# Patient Record
Sex: Female | Born: 1950 | Race: Asian | Hispanic: No | Marital: Married | Smoking: Never smoker
Health system: Southern US, Community
[De-identification: ages and names within clinical notes are randomized; demographics above are authoritative.]

## PROBLEM LIST (undated history)

## (undated) DIAGNOSIS — E1169 Type 2 diabetes mellitus with other specified complication: Secondary | ICD-10-CM

## (undated) DIAGNOSIS — E669 Obesity, unspecified: Secondary | ICD-10-CM

## (undated) DIAGNOSIS — I10 Essential (primary) hypertension: Secondary | ICD-10-CM

## (undated) DIAGNOSIS — I493 Ventricular premature depolarization: Secondary | ICD-10-CM

## (undated) DIAGNOSIS — I5043 Acute on chronic combined systolic (congestive) and diastolic (congestive) heart failure: Secondary | ICD-10-CM

## (undated) DIAGNOSIS — E7800 Pure hypercholesterolemia, unspecified: Secondary | ICD-10-CM

## (undated) DIAGNOSIS — E119 Type 2 diabetes mellitus without complications: Secondary | ICD-10-CM

## (undated) DIAGNOSIS — I2510 Atherosclerotic heart disease of native coronary artery without angina pectoris: Secondary | ICD-10-CM

---

## 2018-08-20 ENCOUNTER — Inpatient Hospital Stay (HOSPITAL_COMMUNITY)
Admission: EM | Admit: 2018-08-20 | Discharge: 2018-09-22 | DRG: 233 | Disposition: A | Discharge status: Home / Self Care | Payer: Medicaid Other | Attending: Surgery | Admitting: Surgery

## 2018-08-20 ENCOUNTER — Emergency Department (HOSPITAL_COMMUNITY): Payer: Medicaid Other

## 2018-08-20 ENCOUNTER — Encounter (HOSPITAL_COMMUNITY): Payer: Self-pay

## 2018-08-20 ENCOUNTER — Other Ambulatory Visit: Payer: Self-pay

## 2018-08-20 DIAGNOSIS — J209 Acute bronchitis, unspecified: Secondary | ICD-10-CM | POA: Diagnosis present

## 2018-08-20 DIAGNOSIS — R0609 Other forms of dyspnea: Secondary | ICD-10-CM

## 2018-08-20 DIAGNOSIS — Z6832 Body mass index (BMI) 32.0-32.9, adult: Secondary | ICD-10-CM

## 2018-08-20 DIAGNOSIS — R001 Bradycardia, unspecified: Secondary | ICD-10-CM | POA: Diagnosis not present

## 2018-08-20 DIAGNOSIS — Z7902 Long term (current) use of antithrombotics/antiplatelets: Secondary | ICD-10-CM

## 2018-08-20 DIAGNOSIS — B9620 Unspecified Escherichia coli [E. coli] as the cause of diseases classified elsewhere: Secondary | ICD-10-CM | POA: Diagnosis not present

## 2018-08-20 DIAGNOSIS — I493 Ventricular premature depolarization: Secondary | ICD-10-CM

## 2018-08-20 DIAGNOSIS — Y831 Surgical operation with implant of artificial internal device as the cause of abnormal reaction of the patient, or of later complication, without mention of misadventure at the time of the procedure: Secondary | ICD-10-CM | POA: Diagnosis present

## 2018-08-20 DIAGNOSIS — Z794 Long term (current) use of insulin: Secondary | ICD-10-CM

## 2018-08-20 DIAGNOSIS — N3000 Acute cystitis without hematuria: Secondary | ICD-10-CM | POA: Diagnosis not present

## 2018-08-20 DIAGNOSIS — E1122 Type 2 diabetes mellitus with diabetic chronic kidney disease: Secondary | ICD-10-CM | POA: Diagnosis present

## 2018-08-20 DIAGNOSIS — E871 Hypo-osmolality and hyponatremia: Secondary | ICD-10-CM | POA: Diagnosis not present

## 2018-08-20 DIAGNOSIS — K567 Ileus, unspecified: Secondary | ICD-10-CM | POA: Diagnosis not present

## 2018-08-20 DIAGNOSIS — I200 Unstable angina: Secondary | ICD-10-CM

## 2018-08-20 DIAGNOSIS — I4891 Unspecified atrial fibrillation: Secondary | ICD-10-CM | POA: Diagnosis not present

## 2018-08-20 DIAGNOSIS — D696 Thrombocytopenia, unspecified: Secondary | ICD-10-CM | POA: Diagnosis not present

## 2018-08-20 DIAGNOSIS — Z9861 Coronary angioplasty status: Secondary | ICD-10-CM

## 2018-08-20 DIAGNOSIS — N170 Acute kidney failure with tubular necrosis: Secondary | ICD-10-CM | POA: Diagnosis not present

## 2018-08-20 DIAGNOSIS — R079 Chest pain, unspecified: Secondary | ICD-10-CM

## 2018-08-20 DIAGNOSIS — E1169 Type 2 diabetes mellitus with other specified complication: Secondary | ICD-10-CM

## 2018-08-20 DIAGNOSIS — E875 Hyperkalemia: Secondary | ICD-10-CM | POA: Diagnosis present

## 2018-08-20 DIAGNOSIS — D62 Acute posthemorrhagic anemia: Secondary | ICD-10-CM | POA: Diagnosis not present

## 2018-08-20 DIAGNOSIS — E7800 Pure hypercholesterolemia, unspecified: Secondary | ICD-10-CM

## 2018-08-20 DIAGNOSIS — I2510 Atherosclerotic heart disease of native coronary artery without angina pectoris: Secondary | ICD-10-CM | POA: Diagnosis present

## 2018-08-20 DIAGNOSIS — Z23 Encounter for immunization: Secondary | ICD-10-CM

## 2018-08-20 DIAGNOSIS — Z7982 Long term (current) use of aspirin: Secondary | ICD-10-CM

## 2018-08-20 DIAGNOSIS — E1143 Type 2 diabetes mellitus with diabetic autonomic (poly)neuropathy: Secondary | ICD-10-CM | POA: Diagnosis present

## 2018-08-20 DIAGNOSIS — E1151 Type 2 diabetes mellitus with diabetic peripheral angiopathy without gangrene: Secondary | ICD-10-CM | POA: Diagnosis present

## 2018-08-20 DIAGNOSIS — J9811 Atelectasis: Secondary | ICD-10-CM | POA: Diagnosis not present

## 2018-08-20 DIAGNOSIS — Z7951 Long term (current) use of inhaled steroids: Secondary | ICD-10-CM

## 2018-08-20 DIAGNOSIS — N184 Chronic kidney disease, stage 4 (severe): Secondary | ICD-10-CM | POA: Diagnosis present

## 2018-08-20 DIAGNOSIS — K5901 Slow transit constipation: Secondary | ICD-10-CM

## 2018-08-20 DIAGNOSIS — R109 Unspecified abdominal pain: Secondary | ICD-10-CM

## 2018-08-20 DIAGNOSIS — I447 Left bundle-branch block, unspecified: Secondary | ICD-10-CM | POA: Diagnosis present

## 2018-08-20 DIAGNOSIS — K219 Gastro-esophageal reflux disease without esophagitis: Secondary | ICD-10-CM | POA: Diagnosis present

## 2018-08-20 DIAGNOSIS — I5043 Acute on chronic combined systolic (congestive) and diastolic (congestive) heart failure: Secondary | ICD-10-CM

## 2018-08-20 DIAGNOSIS — K3184 Gastroparesis: Secondary | ICD-10-CM | POA: Diagnosis present

## 2018-08-20 DIAGNOSIS — D631 Anemia in chronic kidney disease: Secondary | ICD-10-CM | POA: Diagnosis present

## 2018-08-20 DIAGNOSIS — I25110 Atherosclerotic heart disease of native coronary artery with unstable angina pectoris: Principal | ICD-10-CM | POA: Diagnosis present

## 2018-08-20 DIAGNOSIS — E1165 Type 2 diabetes mellitus with hyperglycemia: Secondary | ICD-10-CM | POA: Diagnosis not present

## 2018-08-20 DIAGNOSIS — N179 Acute kidney failure, unspecified: Secondary | ICD-10-CM

## 2018-08-20 DIAGNOSIS — I25119 Atherosclerotic heart disease of native coronary artery with unspecified angina pectoris: Secondary | ICD-10-CM

## 2018-08-20 DIAGNOSIS — R9439 Abnormal result of other cardiovascular function study: Secondary | ICD-10-CM

## 2018-08-20 DIAGNOSIS — E11649 Type 2 diabetes mellitus with hypoglycemia without coma: Secondary | ICD-10-CM | POA: Diagnosis not present

## 2018-08-20 DIAGNOSIS — I272 Pulmonary hypertension, unspecified: Secondary | ICD-10-CM | POA: Diagnosis present

## 2018-08-20 DIAGNOSIS — I255 Ischemic cardiomyopathy: Secondary | ICD-10-CM | POA: Diagnosis present

## 2018-08-20 DIAGNOSIS — E119 Type 2 diabetes mellitus without complications: Secondary | ICD-10-CM

## 2018-08-20 DIAGNOSIS — Z09 Encounter for follow-up examination after completed treatment for conditions other than malignant neoplasm: Secondary | ICD-10-CM

## 2018-08-20 DIAGNOSIS — Z01810 Encounter for preprocedural cardiovascular examination: Secondary | ICD-10-CM

## 2018-08-20 DIAGNOSIS — E872 Acidosis: Secondary | ICD-10-CM | POA: Diagnosis not present

## 2018-08-20 DIAGNOSIS — T82855A Stenosis of coronary artery stent, initial encounter: Secondary | ICD-10-CM | POA: Diagnosis present

## 2018-08-20 DIAGNOSIS — I420 Dilated cardiomyopathy: Secondary | ICD-10-CM

## 2018-08-20 DIAGNOSIS — Z8249 Family history of ischemic heart disease and other diseases of the circulatory system: Secondary | ICD-10-CM

## 2018-08-20 DIAGNOSIS — I700 Atherosclerosis of aorta: Secondary | ICD-10-CM | POA: Diagnosis present

## 2018-08-20 DIAGNOSIS — I130 Hypertensive heart and chronic kidney disease with heart failure and stage 1 through stage 4 chronic kidney disease, or unspecified chronic kidney disease: Secondary | ICD-10-CM | POA: Diagnosis present

## 2018-08-20 DIAGNOSIS — R0602 Shortness of breath: Secondary | ICD-10-CM

## 2018-08-20 DIAGNOSIS — E669 Obesity, unspecified: Secondary | ICD-10-CM | POA: Diagnosis present

## 2018-08-20 DIAGNOSIS — R05 Cough: Secondary | ICD-10-CM

## 2018-08-20 DIAGNOSIS — R059 Cough, unspecified: Secondary | ICD-10-CM

## 2018-08-20 DIAGNOSIS — E785 Hyperlipidemia, unspecified: Secondary | ICD-10-CM | POA: Diagnosis present

## 2018-08-20 DIAGNOSIS — Z951 Presence of aortocoronary bypass graft: Secondary | ICD-10-CM

## 2018-08-20 HISTORY — DX: Acute on chronic combined systolic (congestive) and diastolic (congestive) heart failure: I50.43

## 2018-08-20 HISTORY — DX: Atherosclerotic heart disease of native coronary artery without angina pectoris: I25.10

## 2018-08-20 HISTORY — DX: Pure hypercholesterolemia, unspecified: E78.00

## 2018-08-20 HISTORY — DX: Type 2 diabetes mellitus with other specified complication: E11.69

## 2018-08-20 HISTORY — DX: Obesity, unspecified: E66.9

## 2018-08-20 HISTORY — DX: Type 2 diabetes mellitus without complications: E11.9

## 2018-08-20 HISTORY — DX: Ventricular premature depolarization: I49.3

## 2018-08-20 HISTORY — DX: Essential (primary) hypertension: I10

## 2018-08-20 LAB — CBC
HCT: 37.2 % (ref 36.0–46.0)
Hemoglobin: 11.6 g/dL — ABNORMAL LOW (ref 12.0–15.0)
MCH: 26.7 pg (ref 26.0–34.0)
MCHC: 31.2 g/dL (ref 30.0–36.0)
MCV: 85.7 fL (ref 80.0–100.0)
NRBC: 0 % (ref 0.0–0.2)
PLATELETS: 195 10*3/uL (ref 150–400)
RBC: 4.34 MIL/uL (ref 3.87–5.11)
RDW: 14.2 % (ref 11.5–15.5)
WBC: 10.6 10*3/uL — ABNORMAL HIGH (ref 4.0–10.5)

## 2018-08-20 LAB — BASIC METABOLIC PANEL
ANION GAP: 8 (ref 5–15)
BUN: 46 mg/dL — ABNORMAL HIGH (ref 8–23)
CALCIUM: 8.9 mg/dL (ref 8.9–10.3)
CO2: 18 mmol/L — ABNORMAL LOW (ref 22–32)
Chloride: 107 mmol/L (ref 98–111)
Creatinine, Ser: 1.71 mg/dL — ABNORMAL HIGH (ref 0.44–1.00)
GFR, EST AFRICAN AMERICAN: 35 mL/min — AB (ref >60)
GFR, EST NON AFRICAN AMERICAN: 30 mL/min — AB (ref >60)
Glucose, Bld: 389 mg/dL — ABNORMAL HIGH (ref 70–99)
Potassium: 5.5 mmol/L — ABNORMAL HIGH (ref 3.5–5.1)
Sodium: 133 mmol/L — ABNORMAL LOW (ref 135–145)

## 2018-08-20 LAB — I-STAT TROPONIN, ED: TROPONIN I, POC: 0.02 ng/mL (ref 0.00–0.08)

## 2018-08-20 NOTE — ED Notes (Signed)
RN Angela Nevin informed of BP

## 2018-08-20 NOTE — ED Triage Notes (Signed)
Pt visiting from [place], has been in [place] one month.  Prior to coming to [place] productive cough- yellow thick phlegm.  Onset 2 weeks mid chest pain, radiating to left arm, and shortness of breath.  No fevers, N/V/D.

## 2018-08-21 ENCOUNTER — Observation Stay (HOSPITAL_COMMUNITY): Payer: Medicaid Other

## 2018-08-21 ENCOUNTER — Encounter (HOSPITAL_COMMUNITY): Payer: Self-pay | Admitting: Cardiology

## 2018-08-21 ENCOUNTER — Observation Stay (HOSPITAL_BASED_OUTPATIENT_CLINIC_OR_DEPARTMENT_OTHER): Payer: Medicaid Other

## 2018-08-21 DIAGNOSIS — R0789 Other chest pain: Secondary | ICD-10-CM | POA: Insufficient documentation

## 2018-08-21 DIAGNOSIS — R05 Cough: Secondary | ICD-10-CM

## 2018-08-21 DIAGNOSIS — R079 Chest pain, unspecified: Secondary | ICD-10-CM

## 2018-08-21 DIAGNOSIS — R0609 Other forms of dyspnea: Secondary | ICD-10-CM

## 2018-08-21 DIAGNOSIS — I5030 Unspecified diastolic (congestive) heart failure: Secondary | ICD-10-CM

## 2018-08-21 DIAGNOSIS — I739 Peripheral vascular disease, unspecified: Secondary | ICD-10-CM

## 2018-08-21 LAB — BASIC METABOLIC PANEL
Anion gap: 9 (ref 5–15)
BUN: 41 mg/dL — ABNORMAL HIGH (ref 8–23)
CHLORIDE: 112 mmol/L — AB (ref 98–111)
CO2: 18 mmol/L — ABNORMAL LOW (ref 22–32)
CREATININE: 1.57 mg/dL — AB (ref 0.44–1.00)
Calcium: 10.4 mg/dL — ABNORMAL HIGH (ref 8.9–10.3)
GFR, EST AFRICAN AMERICAN: 39 mL/min — AB (ref >60)
GFR, EST NON AFRICAN AMERICAN: 34 mL/min — AB (ref >60)
Glucose, Bld: 192 mg/dL — ABNORMAL HIGH (ref 70–99)
Potassium: 5.2 mmol/L — ABNORMAL HIGH (ref 3.5–5.1)
SODIUM: 139 mmol/L (ref 135–145)

## 2018-08-21 LAB — CBC
HEMATOCRIT: 38 % (ref 36.0–46.0)
Hemoglobin: 11.5 g/dL — ABNORMAL LOW (ref 12.0–15.0)
MCH: 26.4 pg (ref 26.0–34.0)
MCHC: 30.3 g/dL (ref 30.0–36.0)
MCV: 87.2 fL (ref 80.0–100.0)
NRBC: 0 % (ref 0.0–0.2)
PLATELETS: 187 10*3/uL (ref 150–400)
RBC: 4.36 MIL/uL (ref 3.87–5.11)
RDW: 14.3 % (ref 11.5–15.5)
WBC: 9.8 10*3/uL (ref 4.0–10.5)

## 2018-08-21 LAB — GLUCOSE, CAPILLARY
GLUCOSE-CAPILLARY: 343 mg/dL — AB (ref 70–99)
Glucose-Capillary: 280 mg/dL — ABNORMAL HIGH (ref 70–99)
Glucose-Capillary: 292 mg/dL — ABNORMAL HIGH (ref 70–99)

## 2018-08-21 LAB — TROPONIN I
Troponin I: <0.03 ng/mL (ref <0.03)
Troponin I: <0.03 ng/mL (ref <0.03)

## 2018-08-21 LAB — CBG MONITORING, ED
GLUCOSE-CAPILLARY: 168 mg/dL — AB (ref 70–99)
Glucose-Capillary: 134 mg/dL — ABNORMAL HIGH (ref 70–99)

## 2018-08-21 LAB — BRAIN NATRIURETIC PEPTIDE: B Natriuretic Peptide: 532.8 pg/mL — ABNORMAL HIGH (ref 0.0–100.0)

## 2018-08-21 LAB — ECHOCARDIOGRAM COMPLETE

## 2018-08-21 LAB — D-DIMER, QUANTITATIVE: D-Dimer, Quant: 1.07 ug/mL-FEU — ABNORMAL HIGH (ref 0.00–0.50)

## 2018-08-21 MED ORDER — SODIUM CHLORIDE 0.9 % IV SOLN: 1.0000 g | Freq: Once | INTRAVENOUS | Status: DC

## 2018-08-21 MED ORDER — ENOXAPARIN SODIUM 30 MG/0.3ML ~~LOC~~ SOLN
30.0000 mg | SUBCUTANEOUS | Status: DC
  Administered 2018-08-21 – 2018-08-22 (×2): 30 mg via SUBCUTANEOUS
  Filled 2018-08-21 (×3): qty 0.3

## 2018-08-21 MED ORDER — AMLODIPINE BESYLATE 5 MG PO TABS
5.0000 mg | ORAL_TABLET | Freq: Every day | ORAL | Status: DC
  Administered 2018-08-21 – 2018-08-23 (×3): 5 mg via ORAL
  Filled 2018-08-21 (×4): qty 1

## 2018-08-21 MED ORDER — HYDRALAZINE HCL 25 MG PO TABS: 25.0000 mg | ORAL_TABLET | Freq: Three times a day (TID) | ORAL | Status: DC | PRN

## 2018-08-21 MED ORDER — IPRATROPIUM-ALBUTEROL 0.5-2.5 (3) MG/3ML IN SOLN
3.0000 mL | Freq: Four times a day (QID) | RESPIRATORY_TRACT | Status: DC
  Administered 2018-08-21 (×2): 3 mL via RESPIRATORY_TRACT
  Filled 2018-08-21 (×2): qty 3

## 2018-08-21 MED ORDER — IPRATROPIUM-ALBUTEROL 0.5-2.5 (3) MG/3ML IN SOLN
3.0000 mL | Freq: Three times a day (TID) | RESPIRATORY_TRACT | Status: DC
  Filled 2018-08-21: qty 3

## 2018-08-21 MED ORDER — BISOPROLOL FUMARATE 5 MG PO TABS
5.0000 mg | ORAL_TABLET | Freq: Every day | ORAL | Status: DC
  Administered 2018-08-21 – 2018-08-23 (×3): 5 mg via ORAL
  Filled 2018-08-21 (×3): qty 1

## 2018-08-21 MED ORDER — GUAIFENESIN ER 600 MG PO TB12
600.0000 mg | ORAL_TABLET | Freq: Two times a day (BID) | ORAL | Status: DC
  Administered 2018-08-21 – 2018-08-28 (×16): 600 mg via ORAL
  Filled 2018-08-21 (×16): qty 1

## 2018-08-21 MED ORDER — PANTOPRAZOLE SODIUM 40 MG PO TBEC
40.0000 mg | DELAYED_RELEASE_TABLET | Freq: Every day | ORAL | Status: DC
  Administered 2018-08-21 – 2018-08-24 (×4): 40 mg via ORAL
  Filled 2018-08-21 (×5): qty 1

## 2018-08-21 MED ORDER — BUDESONIDE 0.25 MG/2ML IN SUSP
0.2500 mg | Freq: Two times a day (BID) | RESPIRATORY_TRACT | Status: DC
  Administered 2018-08-21 – 2018-08-28 (×15): 0.25 mg via RESPIRATORY_TRACT
  Filled 2018-08-21 (×17): qty 2

## 2018-08-21 MED ORDER — ASPIRIN EC 81 MG PO TBEC
81.0000 mg | DELAYED_RELEASE_TABLET | Freq: Every day | ORAL | Status: DC
  Administered 2018-08-21 – 2018-08-28 (×7): 81 mg via ORAL
  Filled 2018-08-21 (×9): qty 1

## 2018-08-21 MED ORDER — NITROGLYCERIN 0.4 MG SL SUBL: 0.4000 mg | SUBLINGUAL_TABLET | SUBLINGUAL | Status: DC | PRN

## 2018-08-21 MED ORDER — CALCIUM GLUCONATE-NACL 1-0.675 GM/50ML-% IV SOLN
1.0000 g | Freq: Once | INTRAVENOUS | Status: AC
  Administered 2018-08-21: 1000 mg via INTRAVENOUS
  Filled 2018-08-21: qty 50

## 2018-08-21 MED ORDER — IOPAMIDOL (ISOVUE-370) INJECTION 76%
INTRAVENOUS | Status: AC
  Administered 2018-08-21: 50 mL
  Filled 2018-08-21: qty 50

## 2018-08-21 MED ORDER — INSULIN NPH (HUMAN) (ISOPHANE) 100 UNIT/ML ~~LOC~~ SUSP
25.0000 [IU] | Freq: Every day | SUBCUTANEOUS | Status: DC
  Administered 2018-08-21: 25 [IU] via SUBCUTANEOUS
  Filled 2018-08-21: qty 10

## 2018-08-21 MED ORDER — AZITHROMYCIN 250 MG PO TABS
250.0000 mg | ORAL_TABLET | Freq: Every day | ORAL | Status: AC
  Administered 2018-08-22 – 2018-08-25 (×4): 250 mg via ORAL
  Filled 2018-08-21 (×6): qty 1

## 2018-08-21 MED ORDER — INSULIN ASPART 100 UNIT/ML ~~LOC~~ SOLN
0.0000 [IU] | Freq: Three times a day (TID) | SUBCUTANEOUS | Status: DC
  Administered 2018-08-21: 1 [IU] via SUBCUTANEOUS
  Administered 2018-08-21 (×2): 5 [IU] via SUBCUTANEOUS
  Administered 2018-08-22: 9 [IU] via SUBCUTANEOUS
  Administered 2018-08-22 – 2018-08-23 (×2): 7 [IU] via SUBCUTANEOUS
  Administered 2018-08-24: 2 [IU] via SUBCUTANEOUS
  Administered 2018-08-24: 9 [IU] via SUBCUTANEOUS
  Administered 2018-08-24: 3 [IU] via SUBCUTANEOUS
  Administered 2018-08-25: 2 [IU] via SUBCUTANEOUS
  Administered 2018-08-25: 5 [IU] via SUBCUTANEOUS
  Administered 2018-08-26: 3 [IU] via SUBCUTANEOUS
  Administered 2018-08-26: 7 [IU] via SUBCUTANEOUS
  Administered 2018-08-26: 3 [IU] via SUBCUTANEOUS
  Filled 2018-08-21 (×5): qty 1

## 2018-08-21 MED ORDER — ATORVASTATIN CALCIUM 20 MG PO TABS
20.0000 mg | ORAL_TABLET | Freq: Every day | ORAL | Status: DC
  Administered 2018-08-21 – 2018-08-23 (×3): 20 mg via ORAL
  Filled 2018-08-21 (×4): qty 1

## 2018-08-21 MED ORDER — GUAIFENESIN-DM 100-10 MG/5ML PO SYRP
5.0000 mL | ORAL_SOLUTION | ORAL | Status: DC | PRN
  Filled 2018-08-21: qty 5

## 2018-08-21 MED ORDER — CLOPIDOGREL BISULFATE 75 MG PO TABS
75.0000 mg | ORAL_TABLET | Freq: Every day | ORAL | Status: DC
  Administered 2018-08-21 – 2018-08-23 (×3): 75 mg via ORAL
  Filled 2018-08-21 (×3): qty 1

## 2018-08-21 MED ORDER — AZITHROMYCIN 250 MG PO TABS
500.0000 mg | ORAL_TABLET | Freq: Every day | ORAL | Status: AC
  Administered 2018-08-21: 500 mg via ORAL
  Filled 2018-08-21: qty 2

## 2018-08-21 MED ORDER — ALUM & MAG HYDROXIDE-SIMETH 200-200-20 MG/5ML PO SUSP
30.0000 mL | ORAL | Status: DC | PRN
  Administered 2018-08-24: 30 mL via ORAL
  Filled 2018-08-21: qty 30

## 2018-08-21 NOTE — ED Notes (Signed)
Spoke with main pharmacy in regards to pt's medications.   They will verify and send.

## 2018-08-21 NOTE — ED Notes (Signed)
ECHO at the bedside.

## 2018-08-21 NOTE — Progress Notes (Addendum)
Patient seen and examined, admitted by Dr. Hampton Abbot this morning  Briefly 67 year old female, visiting her son and daughter-in-law from [place], came a month ago with history of hypertension, multivessel CAD with recent stents to RCA, left circumflex and LAD,  cath in April 2019 presented with increasing dyspnea, productive cough for 1 months, atypical chest pain, at times pleuritic and radiating to the left arm.  Patient reports productive yellowish phlegm and reflux symptoms.  She has been compliant with all her medications that she brought from [place].  Patient reports that on lying down, she feels chest tightness however also states that her cough gets worse on lying.  States legs hurt on walking however no peripheral edema. No fevers or chills, no smoking history, secondhand smoking from her husband however she states that he does not smoke in the house.  She came from [place] a month ago, more than 18-hour flight.  BP (!) 168/77   Pulse 69   Temp 98.7 F (37.1 C) (Oral)   Resp 18   SpO2 99%   On exam, no current chest pain however mild wheezing and scattered coarse breath sounds bilaterally.  No abdominal pain Labs reviewed, creatinine 1.5, was 1.7 yesterday, potassium 5.2. BNP 532.8, troponin x2 neg, WBCs 9.8 Chest x-ray negative for any pneumonia 2D echo results pending EKG showed rate 64, normal sinus rhythm, LBBB, no prior EKG to compare with  A/p Atypical chest pain : In the setting of recent stents, multivessel CAD, acute bronchitis, GERD -Follow serial cardiac enzymes, BNP  elevated at 532.8 -Follow stat d-dimer, if positive, will need CT angiogram chest to rule out PE -Placed on scheduled nebs, Zithromax, Robitussin, Mucinex -Given recent stents and multivessel CAD, cardiology consulted, discussed with Dr. Acie Fredrickson, follow 2D echo -Placed on Protonix and Maalox as needed - CBG's elevated, obtain hemoglobin A1c -Unclear if patient has prior history of CKD, creatinine  improving.  Discussed in detail with the patient and daughter-in-law at the bedside, agree with current management and plan.    Estill Cotta M.D. [facility] 08/21/2018, 10:54 AM  Pager: [phone]  Addendum: D dimer elevated 1.07, will get CTA chest to r/o PE    Ripudeep Rai M.D. [facility] 08/21/2018, 1:06 PM  Pager: [phone]   CTA reviewed : no PE D/w patient's son, relayed above results. EF 45% in April 2019 per reports from [place].   Estill Cotta M.D. [facility] 08/21/2018, 5:47 PM  Pager: [phone]

## 2018-08-21 NOTE — Consult Note (Addendum)
Cardiology Consultation:   Patient IDObie Silos; [id number]; 02-14-1951   Admit date: 08/20/2018 Date of Consult: 08/21/2018  Primary Care Provider: Patient, No Pcp Per Primary Cardiologist: New to [facility]  Patient Profile:   Joy Boyd is a 67 y.o. female with a hx of hypertension, GERD, multivessel CAD with most recent stents to RCA, left circumflex and LAD April 2019 (approximately 6 stents total per son) who is being seen today for the evaluation of persistent cough and chest pain at the request of Dr. Tana Coast.   History of Present Illness:   Joy Boyd is a 67 year old female who is visiting her son and daughter-in-law from [place] for the last month with a history as stated above who presented to [facility] on 08/20/2018 with increasing dyspnea and productive cough with intermittent atypical chest pain with radiation to her left arm for the last 1 month. Chest pain is noted to be worse with lying flat and with coughing, is nonexertional and has pleuritic components. History obtained with the assistance of son at bedside secondary to language barrier. She denies LE swelling, palpitations, recent fever or chills, nausea or vomiting, orthopnea symptoms, presyncopal or syncopal episodes. She denies tobacco, alcohol or illicit drug use. She has had secondhand tobacco exposure from her husband however, she reports he does not smoke in the house. She reports compliance with all of her medications.  Recent prolonged air travel from [place] to [place]. She follows closely with cardiology in [place] and will be here for another month with her son and husband.   In the ED, troponin negative x2.  BNP mildly elevated at 532.  CXR with multifocal coronary artery calcification with aortic atherosclerosis and no edema or consolidation noted.  EKG with NSR, LBBB with no prior tracings for comparison.  Past Medical History:  Diagnosis Date  . Coronary artery disease   . Diabetes mellitus without  complication ([place])   . Hypertension    History reviewed. No pertinent surgical history.   Prior to Admission medications   Medication Sig Start Date End Date Taking? Authorizing Provider  aspirin 325 MG tablet Take 300 mg by mouth daily.    [provider]  atorvastatin (LIPITOR) 20 MG tablet Take 20 mg by mouth daily.    [provider]  bisoprolol (ZEBETA) 5 MG tablet Take 5 mg by mouth daily.    [provider]  clopidogrel (PLAVIX) 75 MG tablet Take 75 mg by mouth daily.    [provider]  insulin NPH-regular Human (NOVOLIN 70/30) (70-30) 100 UNIT/ML injection Inject 30 Units into the skin daily with breakfast.    [provider]    Inpatient Medications: Scheduled Meds: . amLODipine  5 mg Oral Daily  . aspirin EC  81 mg Oral Daily  . atorvastatin  20 mg Oral Daily  . [START ON 08/22/2018] azithromycin  250 mg Oral Daily  . bisoprolol  5 mg Oral Daily  . budesonide  0.25 mg Inhalation BID  . clopidogrel  75 mg Oral Daily  . enoxaparin (LOVENOX) injection  30 mg Subcutaneous Q24H  . guaiFENesin  600 mg Oral BID  . insulin aspart  0-9 Units Subcutaneous TID WC  . insulin NPH Human  25 Units Subcutaneous QAC breakfast  . ipratropium-albuterol  3 mL Nebulization Q6H  . pantoprazole  40 mg Oral Q0600   Continuous Infusions:  PRN Meds: alum & mag hydroxide-simeth, guaiFENesin-dextromethorphan, hydrALAZINE, nitroGLYCERIN  Allergies:   No Known Allergies  Social History:  Social History   Socioeconomic History  . Marital status: Married    Spouse name: Not on file  . Number of children: Not on file  . Years of education: Not on file  . Highest education level: Not on file  Occupational History  . Not on file  Social Needs  . Financial resource strain: Not on file  . Food insecurity:    Worry: Not on file    Inability: Not on file  . Transportation needs:    Medical: Not on file    Non-medical: Not on file  Tobacco Use    . Smoking status: Never Smoker  . Smokeless tobacco: Never Used  Substance and Sexual Activity  . Alcohol use: Never    Frequency: Never  . Drug use: Never  . Sexual activity: Not on file  Lifestyle  . Physical activity:    Days per week: Not on file    Minutes per session: Not on file  . Stress: Not on file  Relationships  . Social connections:    Talks on phone: Not on file    Gets together: Not on file    Attends religious service: Not on file    Active member of club or organization: Not on file    Attends meetings of clubs or organizations: Not on file    Relationship status: Not on file  . Intimate partner violence:    Fear of current or ex partner: Not on file    Emotionally abused: Not on file    Physically abused: Not on file    Forced sexual activity: Not on file  Other Topics Concern  . Not on file  Social History Narrative  . Not on file    Family History:   Family History  Problem Relation Age of Onset  . CAD Father   . Hypertension Father    Family Status:  Family Status  Relation Name Status  . Father  (Not Specified)    ROS:  Please see the history of present illness.  All other ROS reviewed and negative.     Physical Exam/Data:   Vitals:   08/21/18 0930 08/21/18 0945 08/21/18 1022 08/21/18 1208  BP: (!) 177/75 (!) 167/72 (!) 168/77 126/89  Pulse: 71 73 69 61  Resp: 18 18 18 17   Temp:    98.7 F (37.1 C)  TempSrc:    Oral  SpO2: 97% 99% 99% 98%    Intake/Output Summary (Last 24 hours) at 08/21/2018 1242 Last data filed at 08/21/2018 0450 Gross per 24 hour  Intake 50 ml  Output -  Net 50 ml   There were no vitals filed for this visit. There is no height or weight on file to calculate BMI.   General: Well developed, well nourished, NAD Skin: Warm, dry, intact  Head: Normocephalic, atraumatic, clear, moist mucus membranes. Neck: Negative for carotid bruits. No JVD Lungs:Clear to ausculation bilaterally. No wheezes, rales, or  rhonchi. Breathing is unlabored. Cardiovascular: RRR with S1 S2. No murmurs, rubs, gallops, or LV heave appreciated. Abdomen: Soft, non-tender, non-distended with normoactive bowel sounds.  No obvious abdominal masses. MSK: Strength and tone appear normal for age. 5/5 in all extremities Extremities: No edema. No clubbing or cyanosis. DP/PT pulses 2+ bilaterally Neuro: Alert and oriented. No focal deficits. No facial asymmetry. MAE spontaneously. Psych: Responds to questions appropriately with normal affect.    EKG:  The EKG was personally reviewed and demonstrates: 08/21/2018 NSR with LBBB, with no acute ischemic  changes Telemetry:  Telemetry was personally reviewed and demonstrates: 08/21/18 NSR, PVC's LBBB   Relevant CV Studies:  ECHO: Pending  CATH: No records on file  Laboratory Data:  Chemistry Recent Labs  Lab 08/20/18 2200 08/21/18 0412  NA 133* 139  K 5.5* 5.2*  CL 107 112*  CO2 18* 18*  GLUCOSE 389* 192*  BUN 46* 41*  CREATININE 1.71* 1.57*  CALCIUM 8.9 10.4*  GFRNONAA 30* 34*  GFRAA 35* 39*  ANIONGAP 8 9    No results found for: PROT, ALBUMIN, AST, ALT, ALKPHOS, BILITOT Hematology Recent Labs  Lab 08/20/18 2200 08/21/18 0412  WBC 10.6* 9.8  RBC 4.34 4.36  HGB 11.6* 11.5*  HCT 37.2 38.0  MCV 85.7 87.2  MCH 26.7 26.4  MCHC 31.2 30.3  RDW 14.2 14.3  PLT 195 187   Cardiac Enzymes Recent Labs  Lab 08/21/18 0412 08/21/18 1127  TROPONINI <0.03 <0.03    Recent Labs  Lab 08/20/18 2209  TROPIPOC 0.02    BNP Recent Labs  Lab 08/21/18 0358  BNP 532.8*    DDimer  Recent Labs  Lab 08/21/18 1127  DDIMER 1.07*   TSH: No results found for: TSH Lipids:No results found for: CHOL, HDL, LDLCALC, LDLDIRECT, TRIG, CHOLHDL HgbA1c:No results found for: HGBA1C  Radiology/Studies:  Dg Chest 2 View  Result Date: 08/20/2018 CLINICAL DATA:  Cough and chest pain EXAM: CHEST - 2 VIEW COMPARISON:  None. FINDINGS: Lungs are clear. Heart size and pulmonary  vascularity are normal. No adenopathy. There is aortic atherosclerosis. There are multiple foci of coronary artery calcification. No bone lesions. IMPRESSION: Multifocal coronary artery calcification. Aortic atherosclerosis. No edema or consolidation. Heart size normal. Electronically Signed   By: Lowella Grip III M.D.   On: 08/20/2018 23:09   Assessment and Plan:   1.  Atypical chest pain: -Patient presented with atypical chest discomfort over 1 month duration with c/o of persistent dry>>>then productive cough. Pain is nonexertional, not reproducible and has some pleuritis components. She describes the discomfort as a heaviness which is similar to prior CAD events however to a lesser degree. -History of recent PCI/DES with multivessel CAD and 01/2018 -Given pleuritic component and recent prolonged travel, stat d-dimer completed with pending results>> if positive proceed to CTA per IM -Mildly elevated BNP at 532  -CXR with multifocal coronary artery calcification with aortic atherosclerosis and no edema or consolidation noted.  -EKG with NSR, LBBB with no prior tracings for comparison -Troponin, i-STAT troponin negative at 0.02 with subsequent troponin I negative at <0.03.  -Continue to trend cardiac enzymes, if elevated will consider further ischemic evaluation.  Would likely need to undergo stress test versus definitive cardiac catheterization as opposed to coronary CT secondary to multiple prior stenting -Obtain echocardiogram -Denies current chest pain, last episode yesterday evening  2.  Productive cough: -Unclear if cough has pulmonary or cardiac etiology -There is concern for PE given her prolonged air travel -Placed on scheduled nebs, Zithromax, Robitussin and Mucinex per internal medicine  3.  GERD: -Reports symptoms are not similar to her GERD exacerbations -Reports symptoms well controlled with daily Protonix  4. Claudication symptoms: -Will order bilateral LE duplex and  follow symptoms   For questions or updates, please contact [place] Please consult [email or web address] for contact info under Cardiology/STEMI.   Lyndel Safe NP-C [place] Pager: [phone] 08/21/2018 12:42 PM   Attending Note:   The patient was seen and examined.  Agree with assessment and plan as noted above.  Changes made to the above note as needed.  Patient seen and independently examined with  Kathyrn Drown, NP .   We discussed all aspects of the encounter. I agree with the assessment and plan as stated above.  1.    Chest pain :   Difficult to assess.  Its a bit difficult to assess her given the language difference.  Will get an echo and assess LV wall motion  She has stents so a coronary CT angiogram will not be useful\ Would check serial troponins. Would consider Lexiscan myoview tomorrow   2.  ? Claudication:   She describes some leg pain with walkig Will get lower ext. Duplex scan     I have spent a total of 40 minutes with patient reviewing hospital  notes , telemetry, EKGs, labs and examining patient as well as establishing an assessment and plan that was discussed with the patient. > 50% of time was spent in direct patient care.    Thayer Headings, Brooke Bonito., MD, [facility] 08/21/2018, 4:50 PM [address]  [place] Pager [phone]

## 2018-08-21 NOTE — H&P (Signed)
History and Physical  Joy Boyd HER:[id number] DOB: 1951/02/23 DOA: 08/20/2018 2140  Referring physician: Mesner J (ED) PCP: No primary care provider on file. (in [place]) Outpatient Specialists: n/a  HISTORY   Chief Complaint: atypical chest pain  HPI: Joy Boyd is a 67 y.o. female with HTN, hx of multivessel CAD s/p multiple stents visiting from [place] who presents with increasing dyspnea, productive cough x 1 month, and atypical pleuritic chest pain x 1 week. History obtained with daughter serving as Optometrist at bedside. Patient is staying in [place] with her daughter until December. Denies fevers or chills. Reports chest tightness with inspiration and yellowish thick sputum cough. Denies personal smoking history but reports frequent secondhand exposure to cigarette smoke for several years. Does not feel like her typical anginal symptoms. Compliant with all meds including plavix, which she brought from her home in [place]. The daughter provided written records from the [facility] in [place] where she had received her cardiology care which had most of her prior cath records. In summary, she has had stenting in RCA, LCx, and LAD territories with most recent cath in April 2019. Denies lower extremity edema; PND or orthopnea, although feels that her coughing feels worse when lying supine.    Review of Systems:  + cough productive; pleuritic chest pain; +mild dyspnea on exertion - no fevers/chills - no edema, PND, orthopnea - no nausea/vomiting; no tarry, melanotic or bloody stools - no dysuria, increased urinary frequency - no weight changes  Rest of systems reviewed are negative, except as per above history.   ED course:  Vitals Blood pressure (!) 187/111, pulse 60, temperature 98.7 F (37.1 C), temperature source Oral, resp. rate 19, SpO2 98 %. Received calcium gluconate (K 5.5)   Past Medical History:  Diagnosis Date  . Coronary artery disease   . Diabetes  mellitus without complication ([place])   . Hypertension    History reviewed. No pertinent surgical history.  Social History:  reports that she has never smoked. She has never used smokeless tobacco. She reports that she does not drink alcohol or use drugs.  No Known Allergies  History reviewed. No pertinent family history.    Prior to Admission medications   Medication Sig Start Date End Date Taking? Authorizing Provider  aspirin 325 MG tablet Take 300 mg by mouth daily.   Yes [provider]  atorvastatin (LIPITOR) 20 MG tablet Take 20 mg by mouth daily.   Yes [provider]  bisoprolol (ZEBETA) 5 MG tablet Take 5 mg by mouth daily.   Yes [provider]  clopidogrel (PLAVIX) 75 MG tablet Take 75 mg by mouth daily.   Yes [provider]  insulin NPH-regular Human (NOVOLIN 70/30) (70-30) 100 UNIT/ML injection Inject 30 Units into the skin daily with breakfast.   Yes [provider]    PHYSICAL EXAM   Temp:  [98.7 F (37.1 C)] 98.7 F (37.1 C) (10/26 2155) Pulse Rate:  [53-69] 60 (10/27 0345) Cardiac Rhythm: Normal sinus rhythm (10/27 0230) Resp:  [15-23] 19 (10/27 0345) BP: (151-187)/(68-127) 187/111 (10/27 0345) SpO2:  [96 %-100 %] 98 % (10/27 0345)  BP (!) 187/111   Pulse 60   Temp 98.7 F (37.1 C) (Oral)   Resp 19   SpO2 98%    GEN well-nourished elderly [place] female; resting comfortably in bed  HEENT NCAT EOM intact PERRL; clear oropharynx, no cervical LAD; moist mucus membranes  JVP estimated 4-5 cm H2O above RA; no HJR ;  no carotid bruits b/l ;  CV regular normal rate; normal S1 and S2; no m/r/g or S3/S4; PMI non displaced; no parasternal heave  RESP diminished at bases; no crackles or wheezing; symmetric  ABD soft NT ND +normoactive BS  EXT warm throughout b/l; no peripheral edema b/l  PULSES  DP and radials 2+ intact b/l  SKIN/MSK no rashes or lesions  NEURO/PSYCH AAOx4; no focal deficits   DATA   LABS ON  ADMISSION:  Basic Metabolic Panel: Recent Labs  Lab 08/20/18 2200  NA 133*  K 5.5*  CL 107  CO2 18*  GLUCOSE 389*  BUN 46*  CREATININE 1.71*  CALCIUM 8.9   CBC: Recent Labs  Lab 08/20/18 2200  WBC 10.6*  HGB 11.6*  HCT 37.2  MCV 85.7  PLT 195   Liver Function Tests: No results for input(s): AST, ALT, ALKPHOS, BILITOT, PROT, ALBUMIN in the last 168 hours. No results for input(s): LIPASE, AMYLASE in the last 168 hours. No results for input(s): AMMONIA in the last 168 hours. Coagulation:  No results found for: INR, PROTIME No results found for: PTT Lactic Acid, Venous:  No results found for: LATICACIDVEN Cardiac Enzymes: No results for input(s): CKTOTAL, CKMB, CKMBINDEX, TROPONINI in the last 168 hours. Urinalysis: No results found for: COLORURINE, APPEARANCEUR, LABSPEC, PHURINE, GLUCOSEU, HGBUR, BILIRUBINUR, KETONESUR, PROTEINUR, UROBILINOGEN, NITRITE, LEUKOCYTESUR  BNP (last 3 results) No results for input(s): PROBNP in the last 8760 hours.  No results for input(s): BNP in the last 168 hours.  CBG: No results for input(s): GLUCAP in the last 168 hours.  Radiological Exams on Admission: Dg Chest 2 View  Result Date: 08/20/2018 CLINICAL DATA:  Cough and chest pain EXAM: CHEST - 2 VIEW COMPARISON:  None. FINDINGS: Lungs are clear. Heart size and pulmonary vascularity are normal. No adenopathy. There is aortic atherosclerosis. There are multiple foci of coronary artery calcification. No bone lesions. IMPRESSION: Multifocal coronary artery calcification. Aortic atherosclerosis. No edema or consolidation. Heart size normal. Electronically Signed   By: Lowella Grip III M.D.   On: 08/20/2018 23:09    EKG: Independently reviewed. NSR, frequent PVC, LVH with nonspecific ST-T changes in lateral leads; LAD   ASSESSMENT AND PLAN   Assessment: Joy Boyd is a 67 y.o. female with known multivessel CAD s/p multiple stents (last cath in April 2019 at [place]) currently  on plavix, IDDM, who presented with atypical pleuritic chest pain and associated productive cough. Although patient herself is a non-smoker, several years of secondhand smoking exposure. Will treat empirically for COPD flare. Initial troponin and EKG negative -- will trend troponins and obtain TTE for baseline. Exam also notable for elevated BP. She is on a nitrate equivalent and an anti-anginal agent at [place] that is not available here. In the meantime, reasonable to start her on low dose amlodipine and prn SL nitro. No evidence of fluid overload on initial exam (TTE and BNP pending).   Active Problems:   Atypical chest pain   Plan:   # Atypical pleuritic chest pain with + productive cough > initial EKG and troponin - azithromycin (z-pak) and trial budesonide inhaler - trend troponins x 2 this AM and delta EKG - TTE for baseline here - BNP pending - BP control as below - telemetry  # Hx of multivessel CAD s/p multiple stents, last cath in April 2019 (appears that she had POBA through existing stent and one new stent placement)  - continue aspirin  - continue lipitor (on 20mg  dosage at  home)  - continue plavix 75mg  daily  # HTN - note also on nicorandil and trimetazidine at [place]  - starting amlodipine 5mg  daily  - continue bisoprolol 5mg  daily (home med)  - prn hydralazine 25mg  PO if SBP > 175 while inpatient     # ?AKI on CKD (although suspect CKD per family) > Cr 1.7 on admission  - monitor BMP this AM  - hold off on ACE or ARB on this admission  # IDDM on NPH 70/30 30 units at home  - resume NPH at 25 units daily after breakfast   - ACHS fingersticks    # Mild normocytic anemia Hb 11.6 on admission. Unclear baseline, although per family, she has had mild anemia - can follow as outpatient and if Hb stable, would opt to continue her plavix and aspirin given extent of CAD  DVT Prophylaxis: lovenox Code Status:  Full Code Family Communication: daughter at bedside    Disposition Plan: observation with telemetry for chest pain eval  Patient contact: No emergency contact information on file.  Time spent: > 35 minutes  Colbert Ewing, MD [facility] Pager [phone]  If 7PM-7AM, please contact night-coverage [email or web address] Password [email or web address] 08/21/2018, 4:07 AM

## 2018-08-21 NOTE — Progress Notes (Signed)
  Echocardiogram 2D Echocardiogram has been performed.  Suleyma Wafer T [place] 08/21/2018, 9:55 AM

## 2018-08-22 ENCOUNTER — Observation Stay (HOSPITAL_COMMUNITY): Payer: Medicaid Other

## 2018-08-22 ENCOUNTER — Observation Stay (HOSPITAL_BASED_OUTPATIENT_CLINIC_OR_DEPARTMENT_OTHER): Payer: Medicaid Other

## 2018-08-22 DIAGNOSIS — J9811 Atelectasis: Secondary | ICD-10-CM | POA: Diagnosis not present

## 2018-08-22 DIAGNOSIS — N170 Acute kidney failure with tubular necrosis: Secondary | ICD-10-CM | POA: Diagnosis not present

## 2018-08-22 DIAGNOSIS — Z23 Encounter for immunization: Secondary | ICD-10-CM | POA: Diagnosis not present

## 2018-08-22 DIAGNOSIS — Z7902 Long term (current) use of antithrombotics/antiplatelets: Secondary | ICD-10-CM | POA: Diagnosis not present

## 2018-08-22 DIAGNOSIS — Z7982 Long term (current) use of aspirin: Secondary | ICD-10-CM | POA: Diagnosis not present

## 2018-08-22 DIAGNOSIS — N3000 Acute cystitis without hematuria: Secondary | ICD-10-CM | POA: Diagnosis not present

## 2018-08-22 DIAGNOSIS — Z794 Long term (current) use of insulin: Secondary | ICD-10-CM | POA: Diagnosis not present

## 2018-08-22 DIAGNOSIS — R079 Chest pain, unspecified: Secondary | ICD-10-CM | POA: Diagnosis present

## 2018-08-22 DIAGNOSIS — E669 Obesity, unspecified: Secondary | ICD-10-CM | POA: Diagnosis present

## 2018-08-22 DIAGNOSIS — I5043 Acute on chronic combined systolic (congestive) and diastolic (congestive) heart failure: Secondary | ICD-10-CM | POA: Diagnosis present

## 2018-08-22 DIAGNOSIS — I739 Peripheral vascular disease, unspecified: Secondary | ICD-10-CM

## 2018-08-22 DIAGNOSIS — K567 Ileus, unspecified: Secondary | ICD-10-CM | POA: Diagnosis not present

## 2018-08-22 DIAGNOSIS — N184 Chronic kidney disease, stage 4 (severe): Secondary | ICD-10-CM | POA: Diagnosis present

## 2018-08-22 DIAGNOSIS — E871 Hypo-osmolality and hyponatremia: Secondary | ICD-10-CM | POA: Diagnosis not present

## 2018-08-22 DIAGNOSIS — D62 Acute posthemorrhagic anemia: Secondary | ICD-10-CM | POA: Diagnosis not present

## 2018-08-22 DIAGNOSIS — E875 Hyperkalemia: Secondary | ICD-10-CM | POA: Diagnosis present

## 2018-08-22 DIAGNOSIS — E1143 Type 2 diabetes mellitus with diabetic autonomic (poly)neuropathy: Secondary | ICD-10-CM | POA: Diagnosis present

## 2018-08-22 DIAGNOSIS — E872 Acidosis: Secondary | ICD-10-CM | POA: Diagnosis not present

## 2018-08-22 DIAGNOSIS — Y831 Surgical operation with implant of artificial internal device as the cause of abnormal reaction of the patient, or of later complication, without mention of misadventure at the time of the procedure: Secondary | ICD-10-CM | POA: Diagnosis present

## 2018-08-22 DIAGNOSIS — I2510 Atherosclerotic heart disease of native coronary artery without angina pectoris: Secondary | ICD-10-CM

## 2018-08-22 DIAGNOSIS — Z8249 Family history of ischemic heart disease and other diseases of the circulatory system: Secondary | ICD-10-CM | POA: Diagnosis not present

## 2018-08-22 DIAGNOSIS — R9439 Abnormal result of other cardiovascular function study: Secondary | ICD-10-CM

## 2018-08-22 DIAGNOSIS — I200 Unstable angina: Secondary | ICD-10-CM

## 2018-08-22 DIAGNOSIS — I447 Left bundle-branch block, unspecified: Secondary | ICD-10-CM | POA: Diagnosis present

## 2018-08-22 DIAGNOSIS — I130 Hypertensive heart and chronic kidney disease with heart failure and stage 1 through stage 4 chronic kidney disease, or unspecified chronic kidney disease: Secondary | ICD-10-CM | POA: Diagnosis present

## 2018-08-22 DIAGNOSIS — E1122 Type 2 diabetes mellitus with diabetic chronic kidney disease: Secondary | ICD-10-CM | POA: Diagnosis present

## 2018-08-22 DIAGNOSIS — Z6832 Body mass index (BMI) 32.0-32.9, adult: Secondary | ICD-10-CM | POA: Diagnosis not present

## 2018-08-22 DIAGNOSIS — E1151 Type 2 diabetes mellitus with diabetic peripheral angiopathy without gangrene: Secondary | ICD-10-CM | POA: Diagnosis present

## 2018-08-22 DIAGNOSIS — I25119 Atherosclerotic heart disease of native coronary artery with unspecified angina pectoris: Secondary | ICD-10-CM

## 2018-08-22 DIAGNOSIS — I25110 Atherosclerotic heart disease of native coronary artery with unstable angina pectoris: Secondary | ICD-10-CM | POA: Diagnosis present

## 2018-08-22 DIAGNOSIS — T82855A Stenosis of coronary artery stent, initial encounter: Secondary | ICD-10-CM | POA: Diagnosis present

## 2018-08-22 LAB — BASIC METABOLIC PANEL
Anion gap: 7 (ref 5–15)
BUN: 40 mg/dL — AB (ref 8–23)
CALCIUM: 8.7 mg/dL — AB (ref 8.9–10.3)
CO2: 19 mmol/L — ABNORMAL LOW (ref 22–32)
Chloride: 108 mmol/L (ref 98–111)
Creatinine, Ser: 1.76 mg/dL — ABNORMAL HIGH (ref 0.44–1.00)
GFR calc Af Amer: 34 mL/min — ABNORMAL LOW (ref >60)
GFR, EST NON AFRICAN AMERICAN: 29 mL/min — AB (ref >60)
Glucose, Bld: 430 mg/dL — ABNORMAL HIGH (ref 70–99)
Potassium: 5.3 mmol/L — ABNORMAL HIGH (ref 3.5–5.1)
SODIUM: 134 mmol/L — AB (ref 135–145)

## 2018-08-22 LAB — CBC
HCT: 35.1 % — ABNORMAL LOW (ref 36.0–46.0)
Hemoglobin: 10.8 g/dL — ABNORMAL LOW (ref 12.0–15.0)
MCH: 26.3 pg (ref 26.0–34.0)
MCHC: 30.8 g/dL (ref 30.0–36.0)
MCV: 85.6 fL (ref 80.0–100.0)
PLATELETS: 135 10*3/uL — AB (ref 150–400)
RBC: 4.1 MIL/uL (ref 3.87–5.11)
RDW: 14.3 % (ref 11.5–15.5)
WBC: 9.1 10*3/uL (ref 4.0–10.5)
nRBC: 0 % (ref 0.0–0.2)

## 2018-08-22 LAB — GLUCOSE, CAPILLARY
GLUCOSE-CAPILLARY: 319 mg/dL — AB (ref 70–99)
GLUCOSE-CAPILLARY: 235 mg/dL — AB (ref 70–99)
GLUCOSE-CAPILLARY: 153 mg/dL — AB (ref 70–99)
Glucose-Capillary: 279 mg/dL — ABNORMAL HIGH (ref 70–99)
Glucose-Capillary: 473 mg/dL — ABNORMAL HIGH (ref 70–99)
Glucose-Capillary: 428 mg/dL — ABNORMAL HIGH (ref 70–99)

## 2018-08-22 LAB — NM MYOCAR MULTI W/SPECT W/WALL MOTION / EF
CHL CUP MPHR: 155 {beats}/min
CSEPED: 0 min
CSEPPHR: 109 {beats}/min
Estimated workload: 1 METS
Exercise duration (sec): 0 s
Percent HR: 70 %
RPE: 0
Rest HR: 83 {beats}/min

## 2018-08-22 LAB — MAGNESIUM: MAGNESIUM: 1.6 mg/dL — AB (ref 1.7–2.4)

## 2018-08-22 LAB — HEMOGLOBIN A1C
HEMOGLOBIN A1C: 9.3 % — AB (ref 4.8–5.6)
Mean Plasma Glucose: 220.21 mg/dL

## 2018-08-22 LAB — TROPONIN I

## 2018-08-22 MED ORDER — SODIUM CHLORIDE 0.9% FLUSH: 3.0000 mL | INTRAVENOUS | Status: DC | PRN

## 2018-08-22 MED ORDER — REGADENOSON 0.4 MG/5ML IV SOLN
INTRAVENOUS | Status: AC
  Filled 2018-08-22: qty 5

## 2018-08-22 MED ORDER — AMINOPHYLLINE 25 MG/ML IV SOLN
INTRAVENOUS | Status: AC
  Filled 2018-08-22: qty 10

## 2018-08-22 MED ORDER — INSULIN ASPART 100 UNIT/ML ~~LOC~~ SOLN
5.0000 [IU] | Freq: Once | SUBCUTANEOUS | Status: AC
  Administered 2018-08-22: 5 [IU] via SUBCUTANEOUS
  Filled 2018-08-22: qty 1

## 2018-08-22 MED ORDER — SODIUM CHLORIDE 0.9 % IV SOLN: 250.0000 mL | INTRAVENOUS | Status: DC | PRN

## 2018-08-22 MED ORDER — INSULIN ASPART 100 UNIT/ML ~~LOC~~ SOLN
11.0000 [IU] | Freq: Once | SUBCUTANEOUS | Status: AC
  Administered 2018-08-22: 11 [IU] via SUBCUTANEOUS

## 2018-08-22 MED ORDER — SODIUM CHLORIDE 0.9 % IV SOLN
INTRAVENOUS | Status: DC
  Administered 2018-08-23: 08:00:00 via INTRAVENOUS

## 2018-08-22 MED ORDER — TECHNETIUM TC 99M TETROFOSMIN IV KIT
30.0000 | PACK | Freq: Once | INTRAVENOUS | Status: AC | PRN
  Administered 2018-08-22: 30 via INTRAVENOUS

## 2018-08-22 MED ORDER — IPRATROPIUM-ALBUTEROL 0.5-2.5 (3) MG/3ML IN SOLN: 3.0000 mL | RESPIRATORY_TRACT | Status: DC | PRN

## 2018-08-22 MED ORDER — AMINOPHYLLINE 25 MG/ML IV (NUC MED)
75.0000 mg | Freq: Once | INTRAVENOUS | Status: AC
  Administered 2018-08-22: 75 mg via INTRAVENOUS

## 2018-08-22 MED ORDER — ASPIRIN 81 MG PO CHEW
81.0000 mg | CHEWABLE_TABLET | ORAL | Status: AC
  Administered 2018-08-23: 81 mg via ORAL

## 2018-08-22 MED ORDER — SODIUM CHLORIDE 0.9% FLUSH
3.0000 mL | Freq: Two times a day (BID) | INTRAVENOUS | Status: DC
  Administered 2018-08-22 – 2018-08-23 (×2): 3 mL via INTRAVENOUS

## 2018-08-22 MED ORDER — TECHNETIUM TC 99M TETROFOSMIN IV KIT
10.0000 | PACK | Freq: Once | INTRAVENOUS | Status: AC | PRN
  Administered 2018-08-22: 10 via INTRAVENOUS

## 2018-08-22 MED ORDER — REGADENOSON 0.4 MG/5ML IV SOLN
0.4000 mg | Freq: Once | INTRAVENOUS | Status: AC
  Administered 2018-08-22: 0.4 mg via INTRAVENOUS
  Filled 2018-08-22: qty 5

## 2018-08-22 NOTE — Progress Notes (Signed)
ABI's have been completed. Right 1.05 Left 1.14  08/22/18 11:26 AM Joy Boyd RVT

## 2018-08-22 NOTE — Progress Notes (Signed)
Progress Note  Patient Name: Joy Boyd Date of Encounter: 08/22/2018  Primary Cardiologist: No primary care provider on file. new  Subjective    No chest pain during the night.   Inpatient Medications    Scheduled Meds: . aminophylline      . amLODipine  5 mg Oral Daily  . aspirin EC  81 mg Oral Daily  . atorvastatin  20 mg Oral Daily  . azithromycin  250 mg Oral Daily  . bisoprolol  5 mg Oral Daily  . budesonide  0.25 mg Inhalation BID  . clopidogrel  75 mg Oral Daily  . enoxaparin (LOVENOX) injection  30 mg Subcutaneous Q24H  . guaiFENesin  600 mg Oral BID  . insulin aspart  0-9 Units Subcutaneous TID WC  . insulin NPH Human  25 Units Subcutaneous QAC breakfast  . ipratropium-albuterol  3 mL Nebulization TID  . pantoprazole  40 mg Oral Q0600  . regadenoson       Continuous Infusions:  PRN Meds: alum & mag hydroxide-simeth, guaiFENesin-dextromethorphan, hydrALAZINE, nitroGLYCERIN   Vital Signs    Vitals:   08/22/18 1006 08/22/18 1009 08/22/18 1011 08/22/18 1015  BP: (!) 189/100 (!) 158/96 (!) 166/91 (!) 174/94  Pulse: (!) 103 94 94 80  Resp:      Temp:      TempSrc:      SpO2:        Intake/Output Summary (Last 24 hours) at 08/22/2018 1025 Last data filed at 08/21/2018 1500 Gross per 24 hour  Intake 120 ml  Output -  Net 120 ml   There were no vitals filed for this visit.  Telemetry    SR with bigeminy PVCs, couplets and 4-5 beats NSVT   - Personally Reviewed  ECG    No new this AM - Personally Reviewed  Physical Exam   GEN: No acute distress.   Neck: No JVD Cardiac: RRR with premature beats, no murmurs, rubs, or gallops.  Respiratory: Clear to auscultation bilaterally. GI: Soft, nontender, non-distended  MS: No edema; No deformity. Neuro:  Nonfocal  Psych: Normal affect   Labs    Chemistry Recent Labs  Lab 08/20/18 2200 08/21/18 0412 08/22/18 0132  NA 133* 139 134*  K 5.5* 5.2* 5.3*  CL 107 112* 108  CO2 18* 18* 19*    GLUCOSE 389* 192* 430*  BUN 46* 41* 40*  CREATININE 1.71* 1.57* 1.76*  CALCIUM 8.9 10.4* 8.7*  GFRNONAA 30* 34* 29*  GFRAA 35* 39* 34*  ANIONGAP 8 9 7      Hematology Recent Labs  Lab 08/20/18 2200 08/21/18 0412 08/22/18 0132  WBC 10.6* 9.8 9.1  RBC 4.34 4.36 4.10  HGB 11.6* 11.5* 10.8*  HCT 37.2 38.0 35.1*  MCV 85.7 87.2 85.6  MCH 26.7 26.4 26.3  MCHC 31.2 30.3 30.8  RDW 14.2 14.3 14.3  PLT 195 187 135*    Cardiac Enzymes Recent Labs  Lab 08/21/18 0412 08/21/18 1127 08/21/18 1822 08/22/18 0132  TROPONINI <0.03 <0.03 <0.03 <0.03    Recent Labs  Lab 08/20/18 2209  TROPIPOC 0.02     BNP Recent Labs  Lab 08/21/18 0358  BNP 532.8*     DDimer  Recent Labs  Lab 08/21/18 1127  DDIMER 1.07*     Radiology    Dg Chest 2 View  Result Date: 08/20/2018 CLINICAL DATA:  Cough and chest pain EXAM: CHEST - 2 VIEW COMPARISON:  None. FINDINGS: Lungs are clear. Heart size and pulmonary vascularity are normal.  No adenopathy. There is aortic atherosclerosis. There are multiple foci of coronary artery calcification. No bone lesions. IMPRESSION: Multifocal coronary artery calcification. Aortic atherosclerosis. No edema or consolidation. Heart size normal. Electronically Signed   By: Lowella Grip III M.D.   On: 08/20/2018 23:09   Ct Angio Chest Pe W Or Wo Contrast  Result Date: 08/21/2018 CLINICAL DATA:  Increased dyspnea and chest pain over the past month. Pt also having productive cough. Elevated d-dimer. Symptoms began after a long plane ride from [place] to here to visit her son. EXAM: CT ANGIOGRAPHY CHEST WITH CONTRAST TECHNIQUE: Multidetector CT imaging of the chest was performed using the standard protocol during bolus administration of intravenous contrast. Multiplanar CT image reconstructions and MIPs were obtained to evaluate the vascular anatomy. CONTRAST:  73mL ISOVUE-370 IOPAMIDOL (ISOVUE-370) INJECTION 76% COMPARISON:  Chest radiographs, 08/20/2018.  FINDINGS: Cardiovascular: There is satisfactory opacification of the pulmonary arteries to the segmental level. There is no evidence of a pulmonary embolism. Heart is mildly enlarged. Three-vessel coronary artery calcifications. No pericardial effusion. Mild aortic atherosclerotic calcifications. Mediastinum/Nodes: No neck base, axillary, mediastinal or hilar masses or enlarged lymph nodes. Trachea and esophagus are unremarkable. Lungs/Pleura: No evidence of pneumonia or pulmonary edema. Mild bronchial wall thickening in the lower lobes. Mild dependent subsegmental atelectasis most evident at the left posterolateral lung base. No lung mass or suspicious nodule. No pleural effusion or pneumothorax. Upper Abdomen: No acute abnormality. Musculoskeletal: No chest wall abnormality. No acute or significant osseous findings. Review of the MIP images confirms the above findings. IMPRESSION: 1. No evidence of a pulmonary embolism. 2. Mild cardiomegaly. Coronary artery calcifications and aortic atherosclerosis. 3. No evidence of pneumonia or pulmonary edema. Mild bronchial wall thickening in the lower lobes. This is consistent with acute bronchial infection/inflammation given the history of productive cough. Aortic Atherosclerosis (ICD10-I70.0). Electronically Signed   By: Lajean Manes M.D.   On: 08/21/2018 17:02    Cardiac Studies   nuc study pending  Patient Profile     67 y.o. 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  Neg MI.    Assessment & Plan    Chest pain, I did have interpreter with exam.  No pain at night but with exertion same pain as prior to stent in April.  Neg MI.  With nuc study she had same chest discomfort.  Study -stress portion completed, nuc results pending.   Productive cough, ? Related to PVCs though no cough with stress  test.  Claudication dopplers pending.   GERD pains not like her GERD.     For questions or updates, please contact [facility] Please consult [email or web address] for contact info under        Signed, Cecilie Kicks, NP  08/22/2018, 10:25 AM

## 2018-08-22 NOTE — Progress Notes (Signed)
Notified Shahmehdi MD about CBG of 453, MD advises to give 20 units of Novolog with pt meal. Read back of orders confirmed.

## 2018-08-22 NOTE — Progress Notes (Addendum)
Admitting paged about inpatient admit orders and CBG 428.

## 2018-08-22 NOTE — Progress Notes (Signed)
PROGRESS NOTE    Patient: Joy Boyd                            PCP: Patient, No Pcp Per                    DOB: 1951/01/06            DOA: 08/20/2018 NGE:[id number]             DOS: 08/22/2018, 12:50 PM   LOS: 0 days   Date of Service: The patient was seen and examined on 08/22/2018  Subjective:   Patient was seen and examined this morning, stable no acute distress, she has been n.p.o., she has completed the first part of her Lexiscan nuclear stress test reporting some chest discomfort at the time of the stress test.  Otherwise no symptoms overnight. The patient family was present at bedside including her daughter, son, husband. Current finding including echo findings were discussed with the patient family in detail The current expressed understanding and agreement with the current plan.  Brief Narrative:   Joy Boyd is a 67 y.o. female with HTN, hx of multivessel CAD s/p multiple stents visiting from [place] who presents with increasing dyspnea, productive cough x 1 month, and atypical pleuritic chest pain x 1 week. History obtained with daughter serving as Optometrist at bedside. Patient is staying in [place] with her daughter until December. Denies fevers or chills. Reports chest tightness with inspiration and yellowish thick sputum cough. Denies personal smoking history but reports frequent secondhand exposure to cigarette smoke for several years. Does not feel like her typical anginal symptoms. Compliant with all meds including plavix, which she brought from her home in [place]. The daughter provided written records from the [facility] in [place] where she had received her cardiology care which had most of her prior cath records. In summary, she has had stenting in RCA, LCx, and LAD territories with most recent cath in April 2019. Denies lower extremity edema; PND or orthopnea, although feels that her coughing feels worse when lying supine.   Assessment & Plan:    Atypical  chest pain :  -Patient reporting of improved chest pain, did have some chest discomfort with the stress test this morning the first part of Lexiscan nuclear stress test. -D-dimer was elevated, was followed with CTA of the chest was negative for PE  In the setting of recent stents, multivessel CAD, acute bronchitis, GERD -Follow serial cardiac enzymes, BNP  elevated at 532.8 -Placed on scheduled nebs, Zithromax, Robitussin, Mucinex -Given recent stents and multivessel CAD, cardiology consulted, discussed with Dr. Acie Fredrickson, To the recording was completed, reviewed, reporting global hypokinesis, ejection fraction reduced 15 to 20% -Appreciate cardiology input for recommendation -Pending final report Lexiscan stress chest -Continue supportive therapy, aspirin, Plavix, statins PRN nitroglycerin  HTN  - note also on nicorandil and trimetazidine at [place] - starting amlodipine 5mg  daily - continue bisoprolol 5mg  daily (home med) -prn hydralazine 25mg  PO if SBP > 175 while inpatient  Hyperlipidemia -Continue statin  Acute on chronic kidney disease -Monitoring BUN/creatinine, currently at baseline, -Currently holding off ACE/ARB  IDDM on NPH 70/30 30 units at home             - resume NPH at 25 units daily after breakfast              - ACHS fingersticks   DVT prophylaxis: SCD/Compression stockings  Code  Status:   Code Status: Full Code  Family Communication:  The above findings and plan of care has been discussed with patient and family in detail, they expressed understanding and agreement of above.  Disposition Plan:   Anticipated 1-2 days  Consultants: Cardiologist  Procedures:    Cardiac Lexiscan nuclear stress test  Antimicrobials:  Anti-infectives (From admission, onward)   Start     Dose/Rate Route Frequency Ordered Stop   08/22/18 1000  azithromycin (ZITHROMAX) tablet 250 mg     250 mg Oral Daily 08/21/18 0405 08/26/18 0959   08/21/18 0500  azithromycin (ZITHROMAX)  tablet 500 mg     500 mg Oral Daily 08/21/18 0405 08/21/18 0606       Medication:  . aminophylline      . amLODipine  5 mg Oral Daily  . aspirin EC  81 mg Oral Daily  . atorvastatin  20 mg Oral Daily  . azithromycin  250 mg Oral Daily  . bisoprolol  5 mg Oral Daily  . budesonide  0.25 mg Inhalation BID  . clopidogrel  75 mg Oral Daily  . enoxaparin (LOVENOX) injection  30 mg Subcutaneous Q24H  . guaiFENesin  600 mg Oral BID  . insulin aspart  0-9 Units Subcutaneous TID WC  . insulin NPH Human  25 Units Subcutaneous QAC breakfast  . ipratropium-albuterol  3 mL Nebulization TID  . pantoprazole  40 mg Oral Q0600  . regadenoson        alum & mag hydroxide-simeth, guaiFENesin-dextromethorphan, hydrALAZINE, nitroGLYCERIN     Objective:   Vitals:   08/22/18 1009 08/22/18 1011 08/22/18 1015 08/22/18 1151  BP: (!) 158/96 (!) 166/91 (!) 174/94 (!) 150/98  Pulse: 94 94 80 73  Resp:    16  Temp:    97.9 F (36.6 C)  TempSrc:    Oral  SpO2:    99%    Intake/Output Summary (Last 24 hours) at 08/22/2018 1250 Last data filed at 08/21/2018 1500 Gross per 24 hour  Intake 120 ml  Output -  Net 120 ml   There were no vitals filed for this visit.   Examination:    General exam: Appears calm and comfortable  BP (!) 150/98 (BP Location: Left Arm)   Pulse 73   Temp 97.9 F (36.6 C) (Oral)   Resp 16   SpO2 99%    Physical Exam  Constitution:  Alert, cooperative, no distress,  Psychiatric: Normal and stable mood and affect, cognition intact,   HEENT: Normocephalic, PERRL, otherwise with in Normal limits  Chest:Chest symmetric Cardio vascular:  S1/S2, RRR, No murmure, No Rubs or Gallops  pulmonary: Clear to auscultation bilaterally, respirations unlabored, negative wheezes / crackles Abdomen: Soft, non-tender, non-distended, bowel sounds,no masses, no organomegaly Muscular skeletal: Limited exam - in bed, able to move all 4 extremities, Normal strength,  Neuro: CNII-XII  intact. , normal motor and sensation, reflexes intact  Extremities: No pitting edema lower extremities, +2 pulses  Skin: Dry, warm to touch, negative for any Rashes, No open wounds Wounds: per nursing documentation  LABs:  CBC Latest Ref Rng & Units 08/22/2018 08/21/2018 08/20/2018  WBC 4.0 - 10.5 K/uL 9.1 9.8 10.6(H)  Hemoglobin 12.0 - 15.0 g/dL 10.8(L) 11.5(L) 11.6(L)  Hematocrit 36.0 - 46.0 % 35.1(L) 38.0 37.2  Platelets 150 - 400 K/uL 135(L) 187 195   CMP Latest Ref Rng & Units 08/22/2018 08/21/2018 08/20/2018  Glucose 70 - 99 mg/dL 430(H) 192(H) 389(H)  BUN 8 - 23 mg/dL 40(H)  41(H) 46(H)  Creatinine 0.44 - 1.00 mg/dL 1.76(H) 1.57(H) 1.71(H)  Sodium 135 - 145 mmol/L 134(L) 139 133(L)  Potassium 3.5 - 5.1 mmol/L 5.3(H) 5.2(H) 5.5(H)  Chloride 98 - 111 mmol/L 108 112(H) 107  CO2 22 - 32 mmol/L 19(L) 18(L) 18(L)  Calcium 8.9 - 10.3 mg/dL 8.7(L) 10.4(H) 8.9

## 2018-08-22 NOTE — Progress Notes (Signed)
Inpatient Diabetes Program Recommendations  AACE/ADA: New Consensus Statement on Inpatient Glycemic Control (2015)  Target Ranges:  Prepandial:   less than 140 mg/dL      Peak postprandial:   less than 180 mg/dL (1-2 hours)      Critically ill patients:  140 - 180 mg/dL   Results for Joy Boyd, Joy Boyd (MRN [id number]) as of 08/22/2018 12:04  Ref. Range 08/21/2018 12:01 08/21/2018 18:34 08/21/2018 23:43 08/22/2018 06:34 08/22/2018 11:48  Glucose-Capillary Latest Ref Range: 70 - 99 mg/dL 280 (H) 292 (H) 343 (H) 279 (H) 319 (H)   Review of Glycemic Control  Diabetes history: DM 2 Outpatient Diabetes medications: 70/30 40 units qam, 30 units qpm Current orders for Inpatient glycemic control: NPH 25 units Daily, Novolog 0-9 units tid  A1c 9.3% on 10/28  Inpatient Diabetes Program Recommendations:    Glucose trends in the 300's. Patient placed on NPH and not 70/30 insulin while here and NPO for stress test this am.   If patient can resume diet consider reordering 70/30 insulin at lower dose, 35 units QAM and 20 units QPM to start, starting tonight with supper time, Consider increasing Novolog Correction scale to 0-15 units.  Thanks,  Tama Headings RN, MSN, BC-ADM Inpatient Diabetes Coordinator Team Pager [phone] (8a-5p)

## 2018-08-22 NOTE — Progress Notes (Signed)
CBG rechecked 235 at this time.

## 2018-08-23 ENCOUNTER — Encounter (HOSPITAL_COMMUNITY): Payer: Self-pay | Admitting: Cardiovascular Disease

## 2018-08-23 ENCOUNTER — Inpatient Hospital Stay (HOSPITAL_COMMUNITY)
Admission: EM | Disposition: A | Discharge status: Home / Self Care | Payer: Self-pay | Source: Home / Self Care | Attending: Surgery

## 2018-08-23 DIAGNOSIS — I493 Ventricular premature depolarization: Secondary | ICD-10-CM

## 2018-08-23 DIAGNOSIS — E669 Obesity, unspecified: Secondary | ICD-10-CM

## 2018-08-23 DIAGNOSIS — I5043 Acute on chronic combined systolic (congestive) and diastolic (congestive) heart failure: Secondary | ICD-10-CM

## 2018-08-23 DIAGNOSIS — E1169 Type 2 diabetes mellitus with other specified complication: Secondary | ICD-10-CM

## 2018-08-23 DIAGNOSIS — I420 Dilated cardiomyopathy: Secondary | ICD-10-CM

## 2018-08-23 DIAGNOSIS — N179 Acute kidney failure, unspecified: Secondary | ICD-10-CM

## 2018-08-23 DIAGNOSIS — E785 Hyperlipidemia, unspecified: Secondary | ICD-10-CM

## 2018-08-23 DIAGNOSIS — I5023 Acute on chronic systolic (congestive) heart failure: Secondary | ICD-10-CM

## 2018-08-23 DIAGNOSIS — N189 Chronic kidney disease, unspecified: Secondary | ICD-10-CM

## 2018-08-23 DIAGNOSIS — Z9861 Coronary angioplasty status: Secondary | ICD-10-CM

## 2018-08-23 DIAGNOSIS — E119 Type 2 diabetes mellitus without complications: Secondary | ICD-10-CM

## 2018-08-23 DIAGNOSIS — I2510 Atherosclerotic heart disease of native coronary artery without angina pectoris: Secondary | ICD-10-CM

## 2018-08-23 DIAGNOSIS — E7800 Pure hypercholesterolemia, unspecified: Secondary | ICD-10-CM

## 2018-08-23 HISTORY — DX: Pure hypercholesterolemia, unspecified: E78.00

## 2018-08-23 HISTORY — PX: LEFT HEART CATH AND CORONARY ANGIOGRAPHY: CATH118249

## 2018-08-23 HISTORY — DX: Type 2 diabetes mellitus with other specified complication: E66.9

## 2018-08-23 HISTORY — DX: Type 2 diabetes mellitus with other specified complication: E11.69

## 2018-08-23 HISTORY — DX: Ventricular premature depolarization: I49.3

## 2018-08-23 HISTORY — DX: Acute on chronic combined systolic (congestive) and diastolic (congestive) heart failure: I50.43

## 2018-08-23 LAB — BASIC METABOLIC PANEL
ANION GAP: 9 (ref 5–15)
Anion gap: 6 (ref 5–15)
Anion gap: 7 (ref 5–15)
BUN: 44 mg/dL — ABNORMAL HIGH (ref 8–23)
BUN: 44 mg/dL — AB (ref 8–23)
BUN: 41 mg/dL — ABNORMAL HIGH (ref 8–23)
CALCIUM: 9 mg/dL (ref 8.9–10.3)
CHLORIDE: 111 mmol/L (ref 98–111)
CHLORIDE: 113 mmol/L — AB (ref 98–111)
CO2: 17 mmol/L — ABNORMAL LOW (ref 22–32)
CO2: 18 mmol/L — AB (ref 22–32)
CO2: 17 mmol/L — AB (ref 22–32)
CREATININE: 1.79 mg/dL — AB (ref 0.44–1.00)
Calcium: 8.9 mg/dL (ref 8.9–10.3)
Calcium: 8.9 mg/dL (ref 8.9–10.3)
Chloride: 110 mmol/L (ref 98–111)
Creatinine, Ser: 1.85 mg/dL — ABNORMAL HIGH (ref 0.44–1.00)
Creatinine, Ser: 1.63 mg/dL — ABNORMAL HIGH (ref 0.44–1.00)
GFR calc Af Amer: 32 mL/min — ABNORMAL LOW (ref >60)
GFR calc Af Amer: 33 mL/min — ABNORMAL LOW (ref >60)
GFR calc non Af Amer: 29 mL/min — ABNORMAL LOW (ref >60)
GFR calc non Af Amer: 32 mL/min — ABNORMAL LOW (ref >60)
GFR, EST AFRICAN AMERICAN: 37 mL/min — AB (ref >60)
GFR, EST NON AFRICAN AMERICAN: 27 mL/min — AB (ref >60)
GLUCOSE: 260 mg/dL — AB (ref 70–99)
Glucose, Bld: 387 mg/dL — ABNORMAL HIGH (ref 70–99)
Glucose, Bld: 240 mg/dL — ABNORMAL HIGH (ref 70–99)
POTASSIUM: 5 mmol/L (ref 3.5–5.1)
POTASSIUM: 5.8 mmol/L — AB (ref 3.5–5.1)
Potassium: 5.2 mmol/L — ABNORMAL HIGH (ref 3.5–5.1)
SODIUM: 135 mmol/L (ref 135–145)
SODIUM: 137 mmol/L (ref 135–145)
Sodium: 136 mmol/L (ref 135–145)

## 2018-08-23 LAB — CBC
HCT: 39.3 % (ref 36.0–46.0)
Hemoglobin: 11.6 g/dL — ABNORMAL LOW (ref 12.0–15.0)
MCH: 25.7 pg — AB (ref 26.0–34.0)
MCHC: 29.5 g/dL — AB (ref 30.0–36.0)
MCV: 87.1 fL (ref 80.0–100.0)
Platelets: 165 10*3/uL (ref 150–400)
RBC: 4.51 MIL/uL (ref 3.87–5.11)
RDW: 14.3 % (ref 11.5–15.5)
WBC: 11.1 10*3/uL — ABNORMAL HIGH (ref 4.0–10.5)
nRBC: 0 % (ref 0.0–0.2)

## 2018-08-23 LAB — GLUCOSE, CAPILLARY
GLUCOSE-CAPILLARY: 415 mg/dL — AB (ref 70–99)
GLUCOSE-CAPILLARY: 101 mg/dL — AB (ref 70–99)
GLUCOSE-CAPILLARY: 48 mg/dL — AB (ref 70–99)
Glucose-Capillary: 324 mg/dL — ABNORMAL HIGH (ref 70–99)
Glucose-Capillary: 98 mg/dL (ref 70–99)

## 2018-08-23 LAB — MAGNESIUM: MAGNESIUM: 1.6 mg/dL — AB (ref 1.7–2.4)

## 2018-08-23 SURGERY — LEFT HEART CATH AND CORONARY ANGIOGRAPHY
Anesthesia: LOCAL

## 2018-08-23 MED ORDER — SODIUM CHLORIDE 0.9 % IV SOLN
INTRAVENOUS | Status: AC
  Administered 2018-08-23: 19:00:00 via INTRAVENOUS

## 2018-08-23 MED ORDER — MIDAZOLAM HCL 2 MG/2ML IJ SOLN
INTRAMUSCULAR | Status: AC
  Filled 2018-08-23: qty 2

## 2018-08-23 MED ORDER — MAGNESIUM SULFATE 2 GM/50ML IV SOLN
2.0000 g | Freq: Once | INTRAVENOUS | Status: AC
  Administered 2018-08-23: 2 g via INTRAVENOUS
  Filled 2018-08-23: qty 50

## 2018-08-23 MED ORDER — SODIUM CHLORIDE 0.9 % IV SOLN: 250.0000 mL | INTRAVENOUS | Status: DC | PRN

## 2018-08-23 MED ORDER — BISOPROLOL FUMARATE 5 MG PO TABS
10.0000 mg | ORAL_TABLET | Freq: Every day | ORAL | Status: DC
  Administered 2018-08-24: 10 mg via ORAL
  Filled 2018-08-23 (×2): qty 2

## 2018-08-23 MED ORDER — MIDAZOLAM HCL 2 MG/2ML IJ SOLN
INTRAMUSCULAR | Status: DC | PRN
  Administered 2018-08-23: 2 mg via INTRAVENOUS
  Administered 2018-08-23: 1 mg via INTRAVENOUS

## 2018-08-23 MED ORDER — SODIUM CHLORIDE 0.9% FLUSH: 3.0000 mL | INTRAVENOUS | Status: DC | PRN

## 2018-08-23 MED ORDER — FENTANYL CITRATE (PF) 100 MCG/2ML IJ SOLN
INTRAMUSCULAR | Status: AC
  Filled 2018-08-23: qty 2

## 2018-08-23 MED ORDER — IOHEXOL 350 MG/ML SOLN
INTRAVENOUS | Status: DC | PRN
  Administered 2018-08-23: 80 mL via INTRAVENOUS

## 2018-08-23 MED ORDER — HEPARIN (PORCINE) IN NACL 1000-0.9 UT/500ML-% IV SOLN
INTRAVENOUS | Status: AC
  Filled 2018-08-23: qty 500

## 2018-08-23 MED ORDER — INSULIN ASPART PROT & ASPART (70-30 MIX) 100 UNIT/ML ~~LOC~~ SUSP
15.0000 [IU] | Freq: Every day | SUBCUTANEOUS | Status: DC
  Filled 2018-08-23: qty 10

## 2018-08-23 MED ORDER — LIDOCAINE HCL (PF) 1 % IJ SOLN
INTRAMUSCULAR | Status: DC | PRN
  Administered 2018-08-23: 15 mL
  Administered 2018-08-23: 2 mL

## 2018-08-23 MED ORDER — HEPARIN (PORCINE) IN NACL 100-0.45 UNIT/ML-% IJ SOLN
950.0000 [IU]/h | INTRAMUSCULAR | Status: DC
  Administered 2018-08-24: 850 [IU]/h via INTRAVENOUS
  Administered 2018-08-25: 1000 [IU]/h via INTRAVENOUS
  Filled 2018-08-23 (×2): qty 250

## 2018-08-23 MED ORDER — INSULIN ASPART 100 UNIT/ML ~~LOC~~ SOLN
12.0000 [IU] | Freq: Once | SUBCUTANEOUS | Status: AC
  Administered 2018-08-23: 12 [IU] via SUBCUTANEOUS

## 2018-08-23 MED ORDER — GLUCOSE 4 G PO CHEW
1.0000 | CHEWABLE_TABLET | Freq: Once | ORAL | Status: AC
  Administered 2018-08-23: 4 g via ORAL

## 2018-08-23 MED ORDER — SODIUM CHLORIDE 0.9 % IV BOLUS
500.0000 mL | Freq: Once | INTRAVENOUS | Status: AC
  Administered 2018-08-23: 500 mL via INTRAVENOUS

## 2018-08-23 MED ORDER — VERAPAMIL HCL 2.5 MG/ML IV SOLN
INTRAVENOUS | Status: AC
  Filled 2018-08-23: qty 2

## 2018-08-23 MED ORDER — LIDOCAINE HCL (PF) 1 % IJ SOLN
INTRAMUSCULAR | Status: AC
  Filled 2018-08-23: qty 30

## 2018-08-23 MED ORDER — FENTANYL CITRATE (PF) 100 MCG/2ML IJ SOLN
INTRAMUSCULAR | Status: DC | PRN
  Administered 2018-08-23 (×2): 25 ug via INTRAVENOUS

## 2018-08-23 MED ORDER — INSULIN NPH (HUMAN) (ISOPHANE) 100 UNIT/ML ~~LOC~~ SUSP
25.0000 [IU] | Freq: Two times a day (BID) | SUBCUTANEOUS | Status: DC
  Administered 2018-08-23: 25 [IU] via SUBCUTANEOUS

## 2018-08-23 MED ORDER — ONDANSETRON HCL 4 MG/2ML IJ SOLN: 4.0000 mg | Freq: Four times a day (QID) | INTRAMUSCULAR | Status: DC | PRN

## 2018-08-23 MED ORDER — GLUCOSE 4 G PO CHEW
CHEWABLE_TABLET | ORAL | Status: AC
  Administered 2018-08-23: 4 g via ORAL
  Filled 2018-08-23: qty 2

## 2018-08-23 MED ORDER — SODIUM CHLORIDE 0.9% FLUSH
3.0000 mL | Freq: Two times a day (BID) | INTRAVENOUS | Status: DC
  Administered 2018-08-25 – 2018-08-27 (×2): 3 mL via INTRAVENOUS

## 2018-08-23 MED ORDER — AMLODIPINE BESYLATE 2.5 MG PO TABS
2.5000 mg | ORAL_TABLET | Freq: Every day | ORAL | Status: DC
  Administered 2018-08-24: 2.5 mg via ORAL
  Filled 2018-08-23 (×2): qty 1

## 2018-08-23 MED ORDER — INSULIN ASPART 100 UNIT/ML ~~LOC~~ SOLN
10.0000 [IU] | Freq: Once | SUBCUTANEOUS | Status: AC
  Administered 2018-08-23: 10 [IU] via SUBCUTANEOUS

## 2018-08-23 MED ORDER — INSULIN ASPART PROT & ASPART (70-30 MIX) 100 UNIT/ML ~~LOC~~ SUSP
25.0000 [IU] | Freq: Every day | SUBCUTANEOUS | Status: DC
  Filled 2018-08-23 (×2): qty 10

## 2018-08-23 MED ORDER — INSULIN ASPART 100 UNIT/ML ~~LOC~~ SOLN: 15.0000 [IU] | Freq: Once | SUBCUTANEOUS | Status: DC

## 2018-08-23 SURGICAL SUPPLY — 14 items
CATH INFINITI 5FR MULTPACK ANG (CATHETERS) ×2 IMPLANT
CLOSURE MYNX CONTROL 5F (Vascular Products) ×2 IMPLANT
GLIDESHEATH SLEND A-KIT 6F 22G (SHEATH) IMPLANT
GLIDESHEATH SLEND SS 6F .021 (SHEATH) ×2 IMPLANT
GUIDEWIRE INQWIRE 1.5J.035X260 (WIRE) IMPLANT
INQWIRE 1.5J .035X260CM (WIRE)
KIT HEART LEFT (KITS) ×2 IMPLANT
PACK CARDIAC CATHETERIZATION (CUSTOM PROCEDURE TRAY) ×2 IMPLANT
SHEATH PINNACLE 5F 10CM (SHEATH) ×2 IMPLANT
SHEATH PROBE COVER 6X72 (BAG) ×2 IMPLANT
TRANSDUCER W/STOPCOCK (MISCELLANEOUS) ×2 IMPLANT
TUBING CIL FLEX 10 FLL-RA (TUBING) ×2 IMPLANT
WIRE EMERALD 3MM-J .035X150CM (WIRE) ×2 IMPLANT
WIRE HI TORQ VERSACORE-J 145CM (WIRE) ×2 IMPLANT

## 2018-08-23 NOTE — H&P (View-Only) (Signed)
Progress Note  Patient Name: Joy Boyd Date of Encounter: 08/23/2018  Primary Cardiologist: No primary care provider on file. new- will be going back to [place] within 1 month  Subjective   Feeling well.  Had mild CP this am.  Denies palpitations, lightheadedness or shortness of breath.   Inpatient Medications    Scheduled Meds: . amLODipine  5 mg Oral Daily  . aspirin EC  81 mg Oral Daily  . atorvastatin  20 mg Oral Daily  . azithromycin  250 mg Oral Daily  . bisoprolol  5 mg Oral Daily  . budesonide  0.25 mg Inhalation BID  . clopidogrel  75 mg Oral Daily  . enoxaparin (LOVENOX) injection  30 mg Subcutaneous Q24H  . guaiFENesin  600 mg Oral BID  . insulin aspart  0-9 Units Subcutaneous TID WC  . insulin NPH Human  25 Units Subcutaneous BID AC & HS  . pantoprazole  40 mg Oral Q0600  . sodium chloride flush  3 mL Intravenous Q12H   Continuous Infusions: . sodium chloride    . sodium chloride 10 mL/hr at 08/23/18 0753   PRN Meds: sodium chloride, alum & mag hydroxide-simeth, guaiFENesin-dextromethorphan, hydrALAZINE, ipratropium-albuterol, nitroGLYCERIN, sodium chloride flush   Vital Signs    Vitals:   08/23/18 0010 08/23/18 0359 08/23/18 0621 08/23/18 0837  BP: (!) 134/51 104/72    Pulse: 63 75    Resp: 16 17    Temp:  98.1 F (36.7 C)    TempSrc:  Oral    SpO2: 95% 97%  99%  Weight:   70.2 kg   Height:        Intake/Output Summary (Last 24 hours) at 08/23/2018 0911 Last data filed at 08/22/2018 2329 Gross per 24 hour  Intake 240 ml  Output -  Net 240 ml   Filed Weights   08/22/18 1600 08/22/18 2133 08/23/18 0621  Weight: 72.6 kg 70.2 kg 70.2 kg    Telemetry    Sinus rhythm.  Frequent PVCs. - Personally Reviewed  ECG   n/a  - Personally Reviewed  Physical Exam   VS:  BP 104/72   Pulse 75   Temp 98.1 F (36.7 C) (Oral)   Resp 17   Ht 5\' 1"  (1.549 m)   Wt 70.2 kg   SpO2 99%   BMI 29.25 kg/m  , BMI Body mass index is 29.25  kg/m. GENERAL:  Well appearing HEENT: Pupils equal round and reactive, fundi not visualized, oral mucosa unremarkable NECK:  No jugular venous distention, waveform within normal limits, carotid upstroke brisk and symmetric, no bruits, no thyromegaly LUNGS:  Clear to auscultation bilaterally HEART:  Mostly regular with frequent ectopy.  PMI not displaced or sustained,S1 and S2 within normal limits, no S3, no S4, no clicks, no rubs, no murmurs ABD:  Flat, positive bowel sounds normal in frequency in pitch, no bruits, no rebound, no guarding, no midline pulsatile mass, no hepatomegaly, no splenomegaly EXT:  2 plus pulses throughout, no edema, no cyanosis no clubbing SKIN:  No rashes no nodules NEURO:  Cranial nerves II through XII grossly intact, motor grossly intact throughout [facility]:  Cognitively intact, oriented to person place and time   Labs    Chemistry Recent Labs  Lab 08/21/18 0412 08/22/18 0132 08/23/18 0419  NA 139 134* 136  K 5.2* 5.3* 5.2*  CL 112* 108 110  CO2 18* 19* 17*  GLUCOSE 192* 430* 260*  BUN 41* 40* 44*  CREATININE 1.57* 1.76* 1.85*  CALCIUM 10.4* 8.7* 9.0  GFRNONAA 34* 29* 27*  GFRAA 39* 34* 32*  ANIONGAP 9 7 9      Hematology Recent Labs  Lab 08/21/18 0412 08/22/18 0132 08/23/18 0419  WBC 9.8 9.1 11.1*  RBC 4.36 4.10 4.51  HGB 11.5* 10.8* 11.6*  HCT 38.0 35.1* 39.3  MCV 87.2 85.6 87.1  MCH 26.4 26.3 25.7*  MCHC 30.3 30.8 29.5*  RDW 14.3 14.3 14.3  PLT 187 135* 165    Cardiac Enzymes Recent Labs  Lab 08/21/18 0412 08/21/18 1127 08/21/18 1822 08/22/18 0132  TROPONINI <0.03 <0.03 <0.03 <0.03    Recent Labs  Lab 08/20/18 2209  TROPIPOC 0.02     BNP Recent Labs  Lab 08/21/18 0358  BNP 532.8*     DDimer  Recent Labs  Lab 08/21/18 1127  DDIMER 1.07*     Radiology    Ct Angio Chest Pe W Or Wo Contrast  Result Date: 08/21/2018 CLINICAL DATA:  Increased dyspnea and chest pain over the past month. Pt also having productive  cough. Elevated d-dimer. Symptoms began after a long plane ride from [place] to here to visit her son. EXAM: CT ANGIOGRAPHY CHEST WITH CONTRAST TECHNIQUE: Multidetector CT imaging of the chest was performed using the standard protocol during bolus administration of intravenous contrast. Multiplanar CT image reconstructions and MIPs were obtained to evaluate the vascular anatomy. CONTRAST:  9mL ISOVUE-370 IOPAMIDOL (ISOVUE-370) INJECTION 76% COMPARISON:  Chest radiographs, 08/20/2018. FINDINGS: Cardiovascular: There is satisfactory opacification of the pulmonary arteries to the segmental level. There is no evidence of a pulmonary embolism. Heart is mildly enlarged. Three-vessel coronary artery calcifications. No pericardial effusion. Mild aortic atherosclerotic calcifications. Mediastinum/Nodes: No neck base, axillary, mediastinal or hilar masses or enlarged lymph nodes. Trachea and esophagus are unremarkable. Lungs/Pleura: No evidence of pneumonia or pulmonary edema. Mild bronchial wall thickening in the lower lobes. Mild dependent subsegmental atelectasis most evident at the left posterolateral lung base. No lung mass or suspicious nodule. No pleural effusion or pneumothorax. Upper Abdomen: No acute abnormality. Musculoskeletal: No chest wall abnormality. No acute or significant osseous findings. Review of the MIP images confirms the above findings. IMPRESSION: 1. No evidence of a pulmonary embolism. 2. Mild cardiomegaly. Coronary artery calcifications and aortic atherosclerosis. 3. No evidence of pneumonia or pulmonary edema. Mild bronchial wall thickening in the lower lobes. This is consistent with acute bronchial infection/inflammation given the history of productive cough. Aortic Atherosclerosis (ICD10-I70.0). Electronically Signed   By: Lajean Manes M.D.   On: 08/21/2018 17:02   Nm Myocar Multi W/spect W/wall Motion / Ef  Result Date: 08/22/2018 CLINICAL DATA:  Chest pain and shortness of breath.  History of CAD post previous coronary stent placement. History of diabetes and hypertension. EXAM: MYOCARDIAL IMAGING WITH SPECT (REST AND PHARMACOLOGIC-STRESS) GATED LEFT VENTRICULAR WALL MOTION STUDY LEFT VENTRICULAR EJECTION FRACTION TECHNIQUE: Standard myocardial SPECT imaging was performed after resting intravenous injection of 10 mCi Tc-37m tetrofosmin. Subsequently, intravenous infusion of Lexiscan was performed under the supervision of the Cardiology staff. At peak effect of the drug, 30 mCi Tc-25m tetrofosmin was injected intravenously and standard myocardial SPECT imaging was performed. Quantitative gated imaging was also performed to evaluate left ventricular wall motion, and estimate left ventricular ejection fraction. COMPARISON:  Chest CT-08/21/2018 FINDINGS: Raw images: Mild GI attenuation is seen worse on the provided stress images. No significant breast or chest wall attenuation. No significant patient motion artifact. Perfusion: There is a moderate sized area of decreased perfusion involving the lateral wall of the  left ventricle as well as a small area of decreased profusion involving the apical aspect of the septum on the provided stress images worrisome for areas of pharmacologically induced ischemia. No definitive matched areas of decreased perfusion to suggest prior infarction. Wall Motion: The left ventricle is moderately dilated. There is severe global hypokinesia with relative akinesia involving the septum. Left Ventricular Ejection Fraction: 24 % End diastolic volume 588 ml End systolic volume 96 ml IMPRESSION: 1. Suspected areas of pharmacologically induced ischemia involving the lateral wall of the left ventricle and septum. 2. No definitive scintigraphic evidence of prior infarction. 3. Moderate left ventricular dilatation. 4. Severe left ventricular global hypokinesia with relative akinesia of the septum. Ejection fraction - 24% Electronically Signed   By: Sandi Mariscal M.D.   On:  08/22/2018 14:27    Cardiac Studies   Echo 08/21/18: Study Conclusions  - Left ventricle: The cavity size was at the upper limits of   normal. Wall thickness was increased in a pattern of mild LVH.   Systolic function was severely reduced. The estimated ejection   fraction was in the range of 15% to 20%. Diffuse hypokinesis.   Features are consistent with a pseudonormal left ventricular   filling pattern, with concomitant abnormal relaxation and   increased filling pressure (grade 2 diastolic dysfunction). - Mitral valve: There was mild regurgitation. Valve area by   pressure half-time: 1.95 cm^2. - Left atrium: The atrium was moderately dilated. - Right ventricle: Systolic function was mildly reduced. - Right atrium: The atrium was mildly to moderately dilated. - Pulmonary arteries: PA peak pressure: 34 mm Hg (S). - Impressions: There is sevre global LV dysfunction in the setting   of very frequent PVCs. 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  Impressions:  - There is sevre global LV dysfunction in the setting of very   frequent PVCs. 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  Patient Profile     Ms. Manson is a 61F from [place] with CAD s/p multiple PCIs, hypertension, diabetes, and CKD 3 here with unstable angina  Assessment & Plan    # Acute on chronic systolic and diastolic heart failure: # Hypertension: LVEF this admission is 15 to 20%, down from 45% when last checked 05/2018.  Her blood pressure has been very labile.  She is had very frequent PVCs, which may be contributing to her cardiomyopathy.  She already received her medications this morning.  For tomorrow we will plan to increase bisoprolol to 10 mg and reduce amlodipine to 2.5 mg.  She is not on an ACE inhibitor/ARB/Entresto due to acute on chronic renal failure.  # CAD s/p multiple PCIs: # Unstable angina: # Hyperlipidemia: Troponin remains negative but she continues to complain of chest pain, most recently this  morning.  She has frequent PVCs as above and reduced systolic function.  Plan for left heart catheterization today if renal function is improved.  Continue aspirin, clopidogrel, and atorvastatin.  # Acute on chronic renal failure:  At baseline her creatinine ranges from 1.5-1.7.  It was 1.85 today.  We will give 500 mL normal saline bolus.  Repeat BMP at 1 PM.  # DM: Glucose very poorly controlled.  She should be on an SGLT2 inhibitor given her heart failure and DM.      For questions or updates, please contact [place] Please consult [email or web address] for contact info under        Signed, Skeet Latch, MD  08/23/2018, 9:11 AM

## 2018-08-23 NOTE — Progress Notes (Addendum)
Inpatient Diabetes Program Recommendations  AACE/ADA: New Consensus Statement on Inpatient Glycemic Control (2019)  Target Ranges:  Prepandial:   less than 140 mg/dL      Peak postprandial:   less than 180 mg/dL (1-2 hours)      Critically ill patients:  140 - 180 mg/dL   Results for Joy Boyd, Joy Boyd (MRN [id number]) as of 08/23/2018 10:53  Ref. Range 08/22/2018 06:34 08/22/2018 11:48 08/22/2018 16:46 08/22/2018 18:17 08/22/2018 20:41 08/22/2018 22:19 08/23/2018 07:17  Glucose-Capillary Latest Ref Range: 70 - 99 mg/dL 279 (H) 319 (H)  Novolog 7 units 473 (H)  Novolog 20 units 428 (H) 235 (H) 153 (H) 324 (H)  Novolog 17 units  NPH 25 units   Review of Glycemic Control  Diabetes history: DM2 Outpatient Diabetes medications: 70/30 35 units QAM, 70/30 25 units QPM Current orders for Inpatient glycemic control: NPH 25 units BID, Novolog 0-9 units TID with meals  Inpatient Diabetes Program Recommendations:  Insulin - Basal: In reviewing chart, noted patient did NOT receive any NPH on 08/22/18. As a result glucose up to 473 mg/dl on 08/22/18 and fasting glucose 324 mg/dl today. Patient has received NPH 25 units this morning. If patient is eating, recommend discontinuing NPH and ordering 70/30 25 units QAM and 70/30 15 units QPM which will provide basal and meal coverage insulin.  Thanks, Barnie Alderman, RN, MSN, CDE Diabetes Coordinator Inpatient Diabetes Program [phone] (Team Pager from 8am to 5pm)

## 2018-08-23 NOTE — Interval H&P Note (Signed)
History and Physical Interval Note:  08/23/2018 4:55 PM  Joy Boyd  has presented today for surgery, with the diagnosis of angina - positive stress.   The various methods of treatment have been discussed with the patient and family. After consideration of risks, benefits and other options for treatment, the patient has consented to  Procedure(s): LEFT HEART CATH AND CORONARY ANGIOGRAPHY (N/A) with possible PERCUTANEOUS CORONARY INTERVENTION as a surgical intervention .  The patient's history has been reviewed, patient examined, no change in status, stable for surgery.  I have reviewed the patient's chart and labs.  Questions were answered to the patient's satisfaction.     Cath Lab Visit (complete for each Cath Lab visit)  Clinical Evaluation Leading to the Procedure:   ACS: No.  Non-ACS:    Anginal Classification: CCS II  Anti-ischemic medical therapy: Maximal Therapy (2 or more classes of medications)  Non-Invasive Test Results: High-risk stress test findings: cardiac mortality >3%/year  Prior CABG: No previous CABG    Glenetta Hew

## 2018-08-23 NOTE — Plan of Care (Signed)
  Problem: Activity: Goal: Risk for activity intolerance will decrease Outcome: Progressing   Problem: Safety: Goal: Ability to remain free from injury will improve Outcome: Progressing   

## 2018-08-23 NOTE — Progress Notes (Signed)
ANTICOAGULATION CONSULT NOTE - Initial Consult  Pharmacy Consult for Heparin Indication: chest pain/ACS  No Known Allergies  Patient Measurements: Height: 5\' 1"  (154.9 cm) Weight: 154 lb 12.8 oz (70.2 kg) IBW/kg (Calculated) : 47.8   Vital Signs: Temp: 97.7 F (36.5 C) (10/29 2000) Temp Source: Oral (10/29 2000) BP: 132/70 (10/29 2045) Pulse Rate: 59 (10/29 2100)  Labs: Recent Labs    08/21/18 0412 08/21/18 1127 08/21/18 1822 08/22/18 0132 08/23/18 0419 08/23/18 1132 08/23/18 1438  HGB 11.5*  --   --  10.8* 11.6*  --   --   HCT 38.0  --   --  35.1* 39.3  --   --   PLT 187  --   --  135* 165  --   --   CREATININE 1.57*  --   --  1.76* 1.85* 1.79* 1.63*  TROPONINI <0.03 <0.03 <0.03 <0.03  --   --   --     Estimated Creatinine Clearance: 30.9 mL/min (A) (by C-G formula based on SCr of 1.63 mg/dL (H)).   Medical History: Past Medical History:  Diagnosis Date  . Acute on chronic combined systolic and diastolic CHF (congestive heart failure) ([place]) 08/23/2018  . Coronary artery disease   . Diabetes mellitus type 2 in obese ([place]) 08/23/2018  . Diabetes mellitus without complication ([place])   . Hypertension   . Pure hypercholesterolemia 08/23/2018  . PVC (premature ventricular contraction) 08/23/2018    Assessment: 67yof admitted with CP, SOB and has Hx CAD - cath with instent stenosis - CABG v PCI  - await decisions - start heparin 8hr after sheath pulled - out at 1800 - start at 0200 10/30.    Goal of Therapy:  Heparin level 0.3-0.7 units/ml Monitor platelets by anticoagulation protocol: Yes   Plan:  Heparin 850 uts/hr - start 0200 10/30 - 8hr after sheath out.   HL 6hr after start Daily HL, CBC  Bonnita Nasuti Pharm.D. CPP, BCPS Clinical Pharmacist [phone] 08/23/2018 9:33 PM

## 2018-08-23 NOTE — ED Provider Notes (Signed)
[facility] Provider Note   CSN: [id number] Arrival date & time: 08/20/18  2137     History   Chief Complaint Chief Complaint  Patient presents with  . Chest Pain  . Cough    HPI Rola Brinson is a 67 y.o. female.  H/o CAD s/p stents most recently in April here with worsening DOE and CP on exertion with generalized fatigue. Apparently also has stage III CKD.    Chest Pain   This is a recurrent problem. The current episode started 6 to 12 hours ago. The problem occurs daily. The problem has been gradually worsening. The pain is associated with exertion. The pain is present in the lateral region and substernal region. The pain is moderate. The quality of the pain is described as brief and exertional. The pain does not radiate. The symptoms are aggravated by exertion. Associated symptoms include cough. She has tried nothing for the symptoms. Risk factors include being elderly and obesity.  Her past medical history is significant for CAD.  Cough  Associated symptoms include chest pain.    Past Medical History:  Diagnosis Date  . Coronary artery disease   . Diabetes mellitus without complication ([place])   . Hypertension     Patient Active Problem List   Diagnosis Date Noted  . CAD in native artery   . Unstable angina ([place])   . Abnormal nuclear stress test   . Atypical chest pain 08/21/2018    History reviewed. No pertinent surgical history.   OB History   None      Home Medications    Prior to Admission medications   Medication Sig Start Date End Date Taking? Authorizing Provider  aspirin 325 MG tablet Take 150 mg by mouth 3 (three) times a week.    Yes [provider]  atorvastatin (LIPITOR) 20 MG tablet Take 20 mg by mouth daily at 6 PM.    Yes [provider]  bisoprolol (ZEBETA) 5 MG tablet Take 5 mg by mouth daily.   Yes [provider]  clopidogrel (PLAVIX) 75 MG tablet Take 75 mg by mouth daily.   Yes  [provider]  insulin NPH-regular Human (NOVOLIN 70/30) (70-30) 100 UNIT/ML injection Inject 25-35 Units into the skin See admin instructions. Inject 35 units am, 25 units evening.   Yes [provider]  nitroGLYCERIN (NITROSTAT) 0.4 MG SL tablet Place 2.6 mg under the tongue at bedtime. **Nitromint**   Yes [provider]  valsartan-hydrochlorothiazide (DIOVAN-HCT) 160-12.5 MG tablet Take 1 tablet by mouth daily.   Yes [provider]    Family History Family History  Problem Relation Age of Onset  . CAD Father   . Hypertension Father     Social History Social History   Tobacco Use  . Smoking status: Never Smoker  . Smokeless tobacco: Never Used  Substance Use Topics  . Alcohol use: Never    Frequency: Never  . Drug use: Never     Allergies   Patient has no known allergies.   Review of Systems Review of Systems  Respiratory: Positive for cough.   Cardiovascular: Positive for chest pain.  All other systems reviewed and are negative.    Physical Exam Updated Vital Signs BP 104/72   Pulse 75   Temp 98.1 F (36.7 C) (Oral)   Resp 17   Ht 5\' 1"  (1.549 m)   Wt 70.2 kg   SpO2 99%   BMI 29.25 kg/m  Physical Exam  Constitutional: She appears well-developed and well-nourished.  HENT:  Head: Normocephalic and atraumatic.  Eyes: Conjunctivae and EOM are normal.  Neck: Normal range of motion.  Cardiovascular: Normal rate and regular rhythm.  Pulmonary/Chest: No stridor. No respiratory distress. She has decreased breath sounds. She has wheezes.  Abdominal: Soft. She exhibits no distension.  Musculoskeletal: Normal range of motion. She exhibits edema.  Neurological: She is alert.  Skin: Skin is warm and dry.  Nursing note and vitals reviewed.    ED Treatments / Results  Labs (all labs ordered are listed, but only abnormal results are displayed) Labs Reviewed  BASIC METABOLIC PANEL - Abnormal; Notable for the following  components:      Result Value   Sodium 133 (*)    Potassium 5.5 (*)    CO2 18 (*)    Glucose, Bld 389 (*)    BUN 46 (*)    Creatinine, Ser 1.71 (*)    GFR calc non Af Amer 30 (*)    GFR calc Af Amer 35 (*)    All other components within normal limits  CBC - Abnormal; Notable for the following components:   WBC 10.6 (*)    Hemoglobin 11.6 (*)    All other components within normal limits  BRAIN NATRIURETIC PEPTIDE - Abnormal; Notable for the following components:   B Natriuretic Peptide 532.8 (*)    All other components within normal limits  CBC - Abnormal; Notable for the following components:   Hemoglobin 11.5 (*)    All other components within normal limits  BASIC METABOLIC PANEL - Abnormal; Notable for the following components:   Potassium 5.2 (*)    Chloride 112 (*)    CO2 18 (*)    Glucose, Bld 192 (*)    BUN 41 (*)    Creatinine, Ser 1.57 (*)    Calcium 10.4 (*)    GFR calc non Af Amer 34 (*)    GFR calc Af Amer 39 (*)    All other components within normal limits  D-DIMER, QUANTITATIVE (NOT AT [facility]) - Abnormal; Notable for the following components:   D-Dimer, Quant 1.07 (*)    All other components within normal limits  GLUCOSE, CAPILLARY - Abnormal; Notable for the following components:   Glucose-Capillary 280 (*)    All other components within normal limits  BASIC METABOLIC PANEL - Abnormal; Notable for the following components:   Sodium 134 (*)    Potassium 5.3 (*)    CO2 19 (*)    Glucose, Bld 430 (*)    BUN 40 (*)    Creatinine, Ser 1.76 (*)    Calcium 8.7 (*)    GFR calc non Af Amer 29 (*)    GFR calc Af Amer 34 (*)    All other components within normal limits  CBC - Abnormal; Notable for the following components:   Hemoglobin 10.8 (*)    HCT 35.1 (*)    Platelets 135 (*)    All other components within normal limits  MAGNESIUM - Abnormal; Notable for the following components:   Magnesium 1.6 (*)    All other components within normal limits    HEMOGLOBIN A1C - Abnormal; Notable for the following components:   Hgb A1c MFr Bld 9.3 (*)    All other components within normal limits  GLUCOSE, CAPILLARY - Abnormal; Notable for the following components:   Glucose-Capillary 292 (*)    All other components within normal limits  GLUCOSE, CAPILLARY -  Abnormal; Notable for the following components:   Glucose-Capillary 343 (*)    All other components within normal limits  GLUCOSE, CAPILLARY - Abnormal; Notable for the following components:   Glucose-Capillary 279 (*)    All other components within normal limits  GLUCOSE, CAPILLARY - Abnormal; Notable for the following components:   Glucose-Capillary 319 (*)    All other components within normal limits  GLUCOSE, CAPILLARY - Abnormal; Notable for the following components:   Glucose-Capillary 473 (*)    All other components within normal limits  BASIC METABOLIC PANEL - Abnormal; Notable for the following components:   Potassium 5.2 (*)    CO2 17 (*)    Glucose, Bld 260 (*)    BUN 44 (*)    Creatinine, Ser 1.85 (*)    GFR calc non Af Amer 27 (*)    GFR calc Af Amer 32 (*)    All other components within normal limits  CBC - Abnormal; Notable for the following components:   WBC 11.1 (*)    Hemoglobin 11.6 (*)    MCH 25.7 (*)    MCHC 29.5 (*)    All other components within normal limits  MAGNESIUM - Abnormal; Notable for the following components:   Magnesium 1.6 (*)    All other components within normal limits  GLUCOSE, CAPILLARY - Abnormal; Notable for the following components:   Glucose-Capillary 428 (*)    All other components within normal limits  GLUCOSE, CAPILLARY - Abnormal; Notable for the following components:   Glucose-Capillary 235 (*)    All other components within normal limits  GLUCOSE, CAPILLARY - Abnormal; Notable for the following components:   Glucose-Capillary 153 (*)    All other components within normal limits  GLUCOSE, CAPILLARY - Abnormal; Notable for the  following components:   Glucose-Capillary 324 (*)    All other components within normal limits  CBG MONITORING, ED - Abnormal; Notable for the following components:   Glucose-Capillary 168 (*)    All other components within normal limits  CBG MONITORING, ED - Abnormal; Notable for the following components:   Glucose-Capillary 134 (*)    All other components within normal limits  TROPONIN I  TROPONIN I  TROPONIN I  TROPONIN I  I-STAT TROPONIN, ED    EKG EKG Interpretation  Date/Time:  Sunday August 21 2018 95:63:[id number] EDT Ventricular Rate:  64 PR Interval:  160 QRS Duration: 125 QT Interval:  385 QTC Calculation: 398 R Axis:   -38 Text Interpretation:  Sinus rhythm Multiple ventricular premature complexes Right atrial enlargement Left bundle branch block No significant change since last tracing Confirmed by Lacretia Leigh ([id number]) on 08/22/2018 7:22:48 PM   Radiology Ct Angio Chest Pe W Or Wo Contrast  Result Date: 08/21/2018 CLINICAL DATA:  Increased dyspnea and chest pain over the past month. Pt also having productive cough. Elevated d-dimer. Symptoms began after a long plane ride from [place] to here to visit her son. EXAM: CT ANGIOGRAPHY CHEST WITH CONTRAST TECHNIQUE: Multidetector CT imaging of the chest was performed using the standard protocol during bolus administration of intravenous contrast. Multiplanar CT image reconstructions and MIPs were obtained to evaluate the vascular anatomy. CONTRAST:  29mL ISOVUE-370 IOPAMIDOL (ISOVUE-370) INJECTION 76% COMPARISON:  Chest radiographs, 08/20/2018. FINDINGS: Cardiovascular: There is satisfactory opacification of the pulmonary arteries to the segmental level. There is no evidence of a pulmonary embolism. Heart is mildly enlarged. Three-vessel coronary artery calcifications. No pericardial effusion. Mild aortic atherosclerotic calcifications. Mediastinum/Nodes: No neck base, axillary,  mediastinal or hilar masses or enlarged lymph nodes.  Trachea and esophagus are unremarkable. Lungs/Pleura: No evidence of pneumonia or pulmonary edema. Mild bronchial wall thickening in the lower lobes. Mild dependent subsegmental atelectasis most evident at the left posterolateral lung base. No lung mass or suspicious nodule. No pleural effusion or pneumothorax. Upper Abdomen: No acute abnormality. Musculoskeletal: No chest wall abnormality. No acute or significant osseous findings. Review of the MIP images confirms the above findings. IMPRESSION: 1. No evidence of a pulmonary embolism. 2. Mild cardiomegaly. Coronary artery calcifications and aortic atherosclerosis. 3. No evidence of pneumonia or pulmonary edema. Mild bronchial wall thickening in the lower lobes. This is consistent with acute bronchial infection/inflammation given the history of productive cough. Aortic Atherosclerosis (ICD10-I70.0). Electronically Signed   By: Lajean Manes M.D.   On: 08/21/2018 17:02   Nm Myocar Multi W/spect W/wall Motion / Ef  Result Date: 08/22/2018 CLINICAL DATA:  Chest pain and shortness of breath. History of CAD post previous coronary stent placement. History of diabetes and hypertension. EXAM: MYOCARDIAL IMAGING WITH SPECT (REST AND PHARMACOLOGIC-STRESS) GATED LEFT VENTRICULAR WALL MOTION STUDY LEFT VENTRICULAR EJECTION FRACTION TECHNIQUE: Standard myocardial SPECT imaging was performed after resting intravenous injection of 10 mCi Tc-45m tetrofosmin. Subsequently, intravenous infusion of Lexiscan was performed under the supervision of the Cardiology staff. At peak effect of the drug, 30 mCi Tc-40m tetrofosmin was injected intravenously and standard myocardial SPECT imaging was performed. Quantitative gated imaging was also performed to evaluate left ventricular wall motion, and estimate left ventricular ejection fraction. COMPARISON:  Chest CT-08/21/2018 FINDINGS: Raw images: Mild GI attenuation is seen worse on the provided stress images. No significant breast or  chest wall attenuation. No significant patient motion artifact. Perfusion: There is a moderate sized area of decreased perfusion involving the lateral wall of the left ventricle as well as a small area of decreased profusion involving the apical aspect of the septum on the provided stress images worrisome for areas of pharmacologically induced ischemia. No definitive matched areas of decreased perfusion to suggest prior infarction. Wall Motion: The left ventricle is moderately dilated. There is severe global hypokinesia with relative akinesia involving the septum. Left Ventricular Ejection Fraction: 24 % End diastolic volume 322 ml End systolic volume 96 ml IMPRESSION: 1. Suspected areas of pharmacologically induced ischemia involving the lateral wall of the left ventricle and septum. 2. No definitive scintigraphic evidence of prior infarction. 3. Moderate left ventricular dilatation. 4. Severe left ventricular global hypokinesia with relative akinesia of the septum. Ejection fraction - 24% Electronically Signed   By: Sandi Mariscal M.D.   On: 08/22/2018 14:27    Procedures Procedures (including critical care time)  Medications Ordered in ED Medications  atorvastatin (LIPITOR) tablet 20 mg (20 mg Oral Given 08/23/18 0842)  bisoprolol (ZEBETA) tablet 5 mg (5 mg Oral Given 08/23/18 0842)  clopidogrel (PLAVIX) tablet 75 mg (75 mg Oral Given 08/23/18 0842)  enoxaparin (LOVENOX) injection 30 mg (30 mg Subcutaneous Given 08/22/18 1457)  aspirin EC tablet 81 mg (0 mg Oral Duplicate 02/54/27 0623)  amLODipine (NORVASC) tablet 5 mg (5 mg Oral Given 08/23/18 0842)  azithromycin (ZITHROMAX) tablet 500 mg (500 mg Oral Given 08/21/18 0606)    Followed by  azithromycin (ZITHROMAX) tablet 250 mg (250 mg Oral Given 08/23/18 0842)  budesonide (PULMICORT) nebulizer solution 0.25 mg (0.25 mg Inhalation Given 08/23/18 0837)  insulin aspart (novoLOG) injection 0-9 Units (7 Units Subcutaneous Given 08/23/18 0844)    hydrALAZINE (APRESOLINE) tablet 25 mg (has no administration  in time range)  nitroGLYCERIN (NITROSTAT) SL tablet 0.4 mg (has no administration in time range)  pantoprazole (PROTONIX) EC tablet 40 mg (40 mg Oral Given 08/23/18 0618)  alum & mag hydroxide-simeth (MAALOX/MYLANTA) 200-200-20 MG/5ML suspension 30 mL (has no administration in time range)  guaiFENesin (MUCINEX) 12 hr tablet 600 mg (600 mg Oral Given 08/23/18 0842)  guaiFENesin-dextromethorphan (ROBITUSSIN DM) 100-10 MG/5ML syrup 5 mL (has no administration in time range)  regadenoson (LEXISCAN) 0.4 MG/5ML injection SOLN (has no administration in time range)  aminophylline 25 MG/ML injection (has no administration in time range)  ipratropium-albuterol (DUONEB) 0.5-2.5 (3) MG/3ML nebulizer solution 3 mL (has no administration in time range)  sodium chloride flush (NS) 0.9 % injection 3 mL (3 mLs Intravenous Given 08/22/18 2326)  sodium chloride flush (NS) 0.9 % injection 3 mL (has no administration in time range)  0.9 %  sodium chloride infusion (has no administration in time range)  0.9 %  sodium chloride infusion ( Intravenous New Bag/Given 08/23/18 0753)  insulin NPH Human (HUMULIN N,NOVOLIN N) injection 25 Units (has no administration in time range)  calcium gluconate 1 g/ 50 mL sodium chloride IVPB (0 g Intravenous Stopped 08/21/18 0450)  iopamidol (ISOVUE-370) 76 % injection (50 mLs  Contrast Given 08/21/18 1545)  insulin aspart (novoLOG) injection 5 Units (5 Units Subcutaneous Given 08/22/18 0025)  technetium tetrofosmin (TC-MYOVIEW) injection 10 millicurie (10 millicuries Intravenous Contrast Given 08/22/18 0835)  regadenoson (LEXISCAN) injection SOLN 0.4 mg (0.4 mg Intravenous Given 08/22/18 1006)  aminophylline injection 75 mg (75 mg Intravenous Given by Other 08/22/18 1015)  technetium tetrofosmin (TC-MYOVIEW) injection 30 millicurie (30 millicuries Intravenous Contrast Given 08/22/18 1010)  aspirin chewable tablet 81 mg (81  mg Oral Given 08/23/18 0618)  insulin aspart (novoLOG) injection 11 Units (11 Units Subcutaneous Given 08/22/18 1724)  insulin aspart (novoLOG) injection 10 Units (10 Units Subcutaneous Given 08/23/18 0843)     Initial Impression / Assessment and Plan / ED Course  I have reviewed the triage vital signs and the nursing notes.  Pertinent labs & imaging results that were available during my care of the patient were reviewed by me and considered in my medical decision making (see chart for details).     H/o cad with worsening chest pain and dyspnea on exertion. Also hyperkalemic with unclear ECG changes. Calcium gluconate given, will admit to hospitalist.   Final Clinical Impressions(s) / ED Diagnoses   Final diagnoses:  Cough  Dyspnea on exertion  Chest pain, unspecified type    ED Discharge Orders    None       Grover [place], Corene Cornea, MD 08/23/18 [id number]

## 2018-08-23 NOTE — Progress Notes (Signed)
Patient is having frequent ventricular bigeminy PVC's. Patient is asymptomatic.Potassium level was 5.8 at 1438. MD notified. Verbal orders were to repeat a BMP at midnight and follow up with results. Will continue to monitor.

## 2018-08-23 NOTE — Progress Notes (Signed)
Progress Note  Patient Name: Joy Boyd Date of Encounter: 08/23/2018  Primary Cardiologist: No primary care provider on file. new- will be going back to [place] within 1 month  Subjective   Feeling well.  Had mild CP this am.  Denies palpitations, lightheadedness or shortness of breath.   Inpatient Medications    Scheduled Meds: . amLODipine  5 mg Oral Daily  . aspirin EC  81 mg Oral Daily  . atorvastatin  20 mg Oral Daily  . azithromycin  250 mg Oral Daily  . bisoprolol  5 mg Oral Daily  . budesonide  0.25 mg Inhalation BID  . clopidogrel  75 mg Oral Daily  . enoxaparin (LOVENOX) injection  30 mg Subcutaneous Q24H  . guaiFENesin  600 mg Oral BID  . insulin aspart  0-9 Units Subcutaneous TID WC  . insulin NPH Human  25 Units Subcutaneous BID AC & HS  . pantoprazole  40 mg Oral Q0600  . sodium chloride flush  3 mL Intravenous Q12H   Continuous Infusions: . sodium chloride    . sodium chloride 10 mL/hr at 08/23/18 0753   PRN Meds: sodium chloride, alum & mag hydroxide-simeth, guaiFENesin-dextromethorphan, hydrALAZINE, ipratropium-albuterol, nitroGLYCERIN, sodium chloride flush   Vital Signs    Vitals:   08/23/18 0010 08/23/18 0359 08/23/18 0621 08/23/18 0837  BP: (!) 134/51 104/72    Pulse: 63 75    Resp: 16 17    Temp:  98.1 F (36.7 C)    TempSrc:  Oral    SpO2: 95% 97%  99%  Weight:   70.2 kg   Height:        Intake/Output Summary (Last 24 hours) at 08/23/2018 0911 Last data filed at 08/22/2018 2329 Gross per 24 hour  Intake 240 ml  Output -  Net 240 ml   Filed Weights   08/22/18 1600 08/22/18 2133 08/23/18 0621  Weight: 72.6 kg 70.2 kg 70.2 kg    Telemetry    Sinus rhythm.  Frequent PVCs. - Personally Reviewed  ECG   n/a  - Personally Reviewed  Physical Exam   VS:  BP 104/72   Pulse 75   Temp 98.1 F (36.7 C) (Oral)   Resp 17   Ht 5\' 1"  (1.549 m)   Wt 70.2 kg   SpO2 99%   BMI 29.25 kg/m  , BMI Body mass index is 29.25  kg/m. GENERAL:  Well appearing HEENT: Pupils equal round and reactive, fundi not visualized, oral mucosa unremarkable NECK:  No jugular venous distention, waveform within normal limits, carotid upstroke brisk and symmetric, no bruits, no thyromegaly LUNGS:  Clear to auscultation bilaterally HEART:  Mostly regular with frequent ectopy.  PMI not displaced or sustained,S1 and S2 within normal limits, no S3, no S4, no clicks, no rubs, no murmurs ABD:  Flat, positive bowel sounds normal in frequency in pitch, no bruits, no rebound, no guarding, no midline pulsatile mass, no hepatomegaly, no splenomegaly EXT:  2 plus pulses throughout, no edema, no cyanosis no clubbing SKIN:  No rashes no nodules NEURO:  Cranial nerves II through XII grossly intact, motor grossly intact throughout [facility]:  Cognitively intact, oriented to person place and time   Labs    Chemistry Recent Labs  Lab 08/21/18 0412 08/22/18 0132 08/23/18 0419  NA 139 134* 136  K 5.2* 5.3* 5.2*  CL 112* 108 110  CO2 18* 19* 17*  GLUCOSE 192* 430* 260*  BUN 41* 40* 44*  CREATININE 1.57* 1.76* 1.85*  CALCIUM 10.4* 8.7* 9.0  GFRNONAA 34* 29* 27*  GFRAA 39* 34* 32*  ANIONGAP 9 7 9      Hematology Recent Labs  Lab 08/21/18 0412 08/22/18 0132 08/23/18 0419  WBC 9.8 9.1 11.1*  RBC 4.36 4.10 4.51  HGB 11.5* 10.8* 11.6*  HCT 38.0 35.1* 39.3  MCV 87.2 85.6 87.1  MCH 26.4 26.3 25.7*  MCHC 30.3 30.8 29.5*  RDW 14.3 14.3 14.3  PLT 187 135* 165    Cardiac Enzymes Recent Labs  Lab 08/21/18 0412 08/21/18 1127 08/21/18 1822 08/22/18 0132  TROPONINI <0.03 <0.03 <0.03 <0.03    Recent Labs  Lab 08/20/18 2209  TROPIPOC 0.02     BNP Recent Labs  Lab 08/21/18 0358  BNP 532.8*     DDimer  Recent Labs  Lab 08/21/18 1127  DDIMER 1.07*     Radiology    Ct Angio Chest Pe W Or Wo Contrast  Result Date: 08/21/2018 CLINICAL DATA:  Increased dyspnea and chest pain over the past month. Pt also having productive  cough. Elevated d-dimer. Symptoms began after a long plane ride from [place] to here to visit her son. EXAM: CT ANGIOGRAPHY CHEST WITH CONTRAST TECHNIQUE: Multidetector CT imaging of the chest was performed using the standard protocol during bolus administration of intravenous contrast. Multiplanar CT image reconstructions and MIPs were obtained to evaluate the vascular anatomy. CONTRAST:  70mL ISOVUE-370 IOPAMIDOL (ISOVUE-370) INJECTION 76% COMPARISON:  Chest radiographs, 08/20/2018. FINDINGS: Cardiovascular: There is satisfactory opacification of the pulmonary arteries to the segmental level. There is no evidence of a pulmonary embolism. Heart is mildly enlarged. Three-vessel coronary artery calcifications. No pericardial effusion. Mild aortic atherosclerotic calcifications. Mediastinum/Nodes: No neck base, axillary, mediastinal or hilar masses or enlarged lymph nodes. Trachea and esophagus are unremarkable. Lungs/Pleura: No evidence of pneumonia or pulmonary edema. Mild bronchial wall thickening in the lower lobes. Mild dependent subsegmental atelectasis most evident at the left posterolateral lung base. No lung mass or suspicious nodule. No pleural effusion or pneumothorax. Upper Abdomen: No acute abnormality. Musculoskeletal: No chest wall abnormality. No acute or significant osseous findings. Review of the MIP images confirms the above findings. IMPRESSION: 1. No evidence of a pulmonary embolism. 2. Mild cardiomegaly. Coronary artery calcifications and aortic atherosclerosis. 3. No evidence of pneumonia or pulmonary edema. Mild bronchial wall thickening in the lower lobes. This is consistent with acute bronchial infection/inflammation given the history of productive cough. Aortic Atherosclerosis (ICD10-I70.0). Electronically Signed   By: Lajean Manes M.D.   On: 08/21/2018 17:02   Nm Myocar Multi W/spect W/wall Motion / Ef  Result Date: 08/22/2018 CLINICAL DATA:  Chest pain and shortness of breath.  History of CAD post previous coronary stent placement. History of diabetes and hypertension. EXAM: MYOCARDIAL IMAGING WITH SPECT (REST AND PHARMACOLOGIC-STRESS) GATED LEFT VENTRICULAR WALL MOTION STUDY LEFT VENTRICULAR EJECTION FRACTION TECHNIQUE: Standard myocardial SPECT imaging was performed after resting intravenous injection of 10 mCi Tc-61m tetrofosmin. Subsequently, intravenous infusion of Lexiscan was performed under the supervision of the Cardiology staff. At peak effect of the drug, 30 mCi Tc-17m tetrofosmin was injected intravenously and standard myocardial SPECT imaging was performed. Quantitative gated imaging was also performed to evaluate left ventricular wall motion, and estimate left ventricular ejection fraction. COMPARISON:  Chest CT-08/21/2018 FINDINGS: Raw images: Mild GI attenuation is seen worse on the provided stress images. No significant breast or chest wall attenuation. No significant patient motion artifact. Perfusion: There is a moderate sized area of decreased perfusion involving the lateral wall of the  left ventricle as well as a small area of decreased profusion involving the apical aspect of the septum on the provided stress images worrisome for areas of pharmacologically induced ischemia. No definitive matched areas of decreased perfusion to suggest prior infarction. Wall Motion: The left ventricle is moderately dilated. There is severe global hypokinesia with relative akinesia involving the septum. Left Ventricular Ejection Fraction: 24 % End diastolic volume 854 ml End systolic volume 96 ml IMPRESSION: 1. Suspected areas of pharmacologically induced ischemia involving the lateral wall of the left ventricle and septum. 2. No definitive scintigraphic evidence of prior infarction. 3. Moderate left ventricular dilatation. 4. Severe left ventricular global hypokinesia with relative akinesia of the septum. Ejection fraction - 24% Electronically Signed   By: Sandi Mariscal M.D.   On:  08/22/2018 14:27    Cardiac Studies   Echo 08/21/18: Study Conclusions  - Left ventricle: The cavity size was at the upper limits of   normal. Wall thickness was increased in a pattern of mild LVH.   Systolic function was severely reduced. The estimated ejection   fraction was in the range of 15% to 20%. Diffuse hypokinesis.   Features are consistent with a pseudonormal left ventricular   filling pattern, with concomitant abnormal relaxation and   increased filling pressure (grade 2 diastolic dysfunction). - Mitral valve: There was mild regurgitation. Valve area by   pressure half-time: 1.95 cm^2. - Left atrium: The atrium was moderately dilated. - Right ventricle: Systolic function was mildly reduced. - Right atrium: The atrium was mildly to moderately dilated. - Pulmonary arteries: PA peak pressure: 34 mm Hg (S). - Impressions: There is sevre global LV dysfunction in the setting   of very frequent PVCs. 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  Impressions:  - There is sevre global LV dysfunction in the setting of very   frequent PVCs. 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  Patient Profile     Ms. Pickler is a 39F from [place] with CAD s/p multiple PCIs, hypertension, diabetes, and CKD 3 here with unstable angina  Assessment & Plan    # Acute on chronic systolic and diastolic heart failure: # Hypertension: LVEF this admission is 15 to 20%, down from 45% when last checked 05/2018.  Her blood pressure has been very labile.  She is had very frequent PVCs, which may be contributing to her cardiomyopathy.  She already received her medications this morning.  For tomorrow we will plan to increase bisoprolol to 10 mg and reduce amlodipine to 2.5 mg.  She is not on an ACE inhibitor/ARB/Entresto due to acute on chronic renal failure.  # CAD s/p multiple PCIs: # Unstable angina: # Hyperlipidemia: Troponin remains negative but she continues to complain of chest pain, most recently this  morning.  She has frequent PVCs as above and reduced systolic function.  Plan for left heart catheterization today if renal function is improved.  Continue aspirin, clopidogrel, and atorvastatin.  # Acute on chronic renal failure:  At baseline her creatinine ranges from 1.5-1.7.  It was 1.85 today.  We will give 500 mL normal saline bolus.  Repeat BMP at 1 PM.  # DM: Glucose very poorly controlled.  She should be on an SGLT2 inhibitor given her heart failure and DM.      For questions or updates, please contact [place] Please consult [email or web address] for contact info under        Signed, Skeet Latch, MD  08/23/2018, 9:11 AM

## 2018-08-23 NOTE — Progress Notes (Signed)
[facility]                                                                              Patient Demographics  Joy Boyd, is a 67 y.o. female, DOB - 08-09-1951, YBW:[id number]  Admit date - 08/20/2018   Admitting Physician Colbert Ewing, MD  Outpatient Primary MD for the patient is Patient, No Pcp Per  Outpatient specialists:   LOS - 1  days   Medical records reviewed and are as summarized below:    Chief Complaint  Patient presents with  . Chest Pain  . Cough       Brief summary    Briefly 67 year old female, visiting her son and daughter-in-law from [place], came a month ago with history of hypertension, multivessel CAD with recent stents to RCA, left circumflex and LAD,  cath in April 2019 presented with increasing dyspnea, productive cough for 1 months, atypical chest pain, at times pleuritic and radiating to the left arm.  Patient reports productive yellowish phlegm and reflux symptoms.  She has been compliant with all her medications that she brought from [place].  Patient reports that on lying down, she feels chest tightness however also states that her cough gets worse on lying.  States legs hurt on walking however no peripheral edema. No fevers or chills, no smoking history, secondhand smoking from her husband however she states that he does not smoke in the house.  She came from [place] a month ago, more than 18-hour flight.   Assessment & Plan    Principal Problem:   Unstable angina [facility]): In the setting of recent stents, multivessel CAD, acute bronchitis, GERD -No chest pain this morning, d-dimer was elevated, CT angiogram of the chest negative for PE  -2D echo showed EF of 15 to 20%, global hypokinesis, per son at the bedside EF was 45% in April 2019  -Patient underwent stress test which showed pharmacologically induced ischemia in the lateral wall of left ventricle and septum, moderate left ventricular dilatation, severe left ventricular  global hypokinesia with relative akinesia of the septum, EF 24%. -Continue aspirin, Plavix, beta-blocker, statin -Plan for cardiac cath today  Active problems Acute bronchitis Improving, continue nebs, Zithromax, Robitussin, Mucinex  GERD -Continue PPI, Maalox as needed  Uncontrolled diabetes mellitus with hyperglycemia, type II, insulin-dependent Hemoglobin A1c 9.3 Reviewed diabetic coordinator recommendations, change to insulin 70/30 25 units a.m., 15 units p.m., continue sliding scale insulin  Acute on chronic kidney disease possibly stage II-III -Baseline creatinine unknown, currently holding ACE/ARB -Creatinine 1.8, receiving IV fluids for possible cath today  Essential hypertension Continue beta-blocker, amlodipine, no ACE/ARB/Entresto secondary to renal insufficiency  Hyperlipidemia Continue statins  Code Status: Full code DVT Prophylaxis:  Lovenox  Family Communication: Discussed in detail with the patient, all imaging results, lab results explained to the patient, son and husband at the bedside   Disposition Plan: Pending cardiology clearance  Time Spent in minutes   35 minutes  Procedures:  2D echo Nuclear medicine stress test; results as above  Consultants:   Cardiology  Antimicrobials:  Zithromax  Medications  Scheduled Meds: . [START ON 08/24/2018] amLODipine  2.5 mg  Oral Daily  . aspirin EC  81 mg Oral Daily  . atorvastatin  20 mg Oral Daily  . azithromycin  250 mg Oral Daily  . [START ON 08/24/2018] bisoprolol  10 mg Oral Daily  . budesonide  0.25 mg Inhalation BID  . clopidogrel  75 mg Oral Daily  . enoxaparin (LOVENOX) injection  30 mg Subcutaneous Q24H  . guaiFENesin  600 mg Oral BID  . insulin aspart  0-9 Units Subcutaneous TID WC  . insulin aspart protamine- aspart  15 Units Subcutaneous Q supper  . [START ON 08/24/2018] insulin aspart protamine- aspart  25 Units Subcutaneous Q breakfast  . pantoprazole  40 mg Oral Q0600  . sodium  chloride flush  3 mL Intravenous Q12H   Continuous Infusions: . sodium chloride    . sodium chloride 10 mL/hr at 08/23/18 0753  . sodium chloride 500 mL (08/23/18 1227)   PRN Meds:.sodium chloride, alum & mag hydroxide-simeth, guaiFENesin-dextromethorphan, hydrALAZINE, ipratropium-albuterol, nitroGLYCERIN, sodium chloride flush   Antibiotics   Anti-infectives (From admission, onward)   Start     Dose/Rate Route Frequency Ordered Stop   08/22/18 1000  azithromycin (ZITHROMAX) tablet 250 mg     250 mg Oral Daily 08/21/18 0405 08/26/18 0959   08/21/18 0500  azithromycin (ZITHROMAX) tablet 500 mg     500 mg Oral Daily 08/21/18 0405 08/21/18 0606        Subjective:   Megan Harms was seen and examined today.  Concerned about her uncontrolled blood sugars.  No chest pain at this time.  No coughing.  Husband and son at the bedside. Patient denies dizziness, chest pain, shortness of breath, abdominal pain, N/V/D/C. No acute events overnight.    Objective:   Vitals:   08/23/18 0359 08/23/18 0621 08/23/18 0837 08/23/18 1125  BP: 104/72   132/81  Pulse: 75   65  Resp: 17   18  Temp: 98.1 F (36.7 C)   98.3 F (36.8 C)  TempSrc: Oral   Oral  SpO2: 97%  99% 99%  Weight:  70.2 kg    Height:        Intake/Output Summary (Last 24 hours) at 08/23/2018 1232 Last data filed at 08/22/2018 2329 Gross per 24 hour  Intake 240 ml  Output -  Net 240 ml     Wt Readings from Last 3 Encounters:  08/23/18 70.2 kg     Exam  General: Alert and oriented x 3, NAD  Eyes:   HEENT:  Atraumatic, normocephalic  Cardiovascular: S1 S2 auscultated, Regular rate and rhythm.  Respiratory: Clear to auscultation bilaterally, no wheezing, rales or rhonchi  Gastrointestinal: Soft, nontender, nondistended, + bowel sounds  Ext: no pedal edema bilaterally  Neuro: no new deficit  Musculoskeletal: No digital cyanosis, clubbing  Skin: No rashes  Psych: Normal affect and demeanor, alert and  oriented x3    Data Reviewed:  I have personally reviewed following labs and imaging studies  Micro Results No results found for this or any previous visit (from the past 240 hour(s)).  Radiology Reports Dg Chest 2 View  Result Date: 08/20/2018 CLINICAL DATA:  Cough and chest pain EXAM: CHEST - 2 VIEW COMPARISON:  None. FINDINGS: Lungs are clear. Heart size and pulmonary vascularity are normal. No adenopathy. There is aortic atherosclerosis. There are multiple foci of coronary artery calcification. No bone lesions. IMPRESSION: Multifocal coronary artery calcification. Aortic atherosclerosis. No edema or consolidation. Heart size normal. Electronically Signed   By: Lowella Grip III M.D.  On: 08/20/2018 23:09   Ct Angio Chest Pe W Or Wo Contrast  Result Date: 08/21/2018 CLINICAL DATA:  Increased dyspnea and chest pain over the past month. Pt also having productive cough. Elevated d-dimer. Symptoms began after a long plane ride from [place] to here to visit her son. EXAM: CT ANGIOGRAPHY CHEST WITH CONTRAST TECHNIQUE: Multidetector CT imaging of the chest was performed using the standard protocol during bolus administration of intravenous contrast. Multiplanar CT image reconstructions and MIPs were obtained to evaluate the vascular anatomy. CONTRAST:  17mL ISOVUE-370 IOPAMIDOL (ISOVUE-370) INJECTION 76% COMPARISON:  Chest radiographs, 08/20/2018. FINDINGS: Cardiovascular: There is satisfactory opacification of the pulmonary arteries to the segmental level. There is no evidence of a pulmonary embolism. Heart is mildly enlarged. Three-vessel coronary artery calcifications. No pericardial effusion. Mild aortic atherosclerotic calcifications. Mediastinum/Nodes: No neck base, axillary, mediastinal or hilar masses or enlarged lymph nodes. Trachea and esophagus are unremarkable. Lungs/Pleura: No evidence of pneumonia or pulmonary edema. Mild bronchial wall thickening in the lower lobes. Mild dependent  subsegmental atelectasis most evident at the left posterolateral lung base. No lung mass or suspicious nodule. No pleural effusion or pneumothorax. Upper Abdomen: No acute abnormality. Musculoskeletal: No chest wall abnormality. No acute or significant osseous findings. Review of the MIP images confirms the above findings. IMPRESSION: 1. No evidence of a pulmonary embolism. 2. Mild cardiomegaly. Coronary artery calcifications and aortic atherosclerosis. 3. No evidence of pneumonia or pulmonary edema. Mild bronchial wall thickening in the lower lobes. This is consistent with acute bronchial infection/inflammation given the history of productive cough. Aortic Atherosclerosis (ICD10-I70.0). Electronically Signed   By: Lajean Manes M.D.   On: 08/21/2018 17:02   Nm Myocar Multi W/spect W/wall Motion / Ef  Result Date: 08/22/2018 CLINICAL DATA:  Chest pain and shortness of breath. History of CAD post previous coronary stent placement. History of diabetes and hypertension. EXAM: MYOCARDIAL IMAGING WITH SPECT (REST AND PHARMACOLOGIC-STRESS) GATED LEFT VENTRICULAR WALL MOTION STUDY LEFT VENTRICULAR EJECTION FRACTION TECHNIQUE: Standard myocardial SPECT imaging was performed after resting intravenous injection of 10 mCi Tc-67m tetrofosmin. Subsequently, intravenous infusion of Lexiscan was performed under the supervision of the Cardiology staff. At peak effect of the drug, 30 mCi Tc-103m tetrofosmin was injected intravenously and standard myocardial SPECT imaging was performed. Quantitative gated imaging was also performed to evaluate left ventricular wall motion, and estimate left ventricular ejection fraction. COMPARISON:  Chest CT-08/21/2018 FINDINGS: Raw images: Mild GI attenuation is seen worse on the provided stress images. No significant breast or chest wall attenuation. No significant patient motion artifact. Perfusion: There is a moderate sized area of decreased perfusion involving the lateral wall of the left  ventricle as well as a small area of decreased profusion involving the apical aspect of the septum on the provided stress images worrisome for areas of pharmacologically induced ischemia. No definitive matched areas of decreased perfusion to suggest prior infarction. Wall Motion: The left ventricle is moderately dilated. There is severe global hypokinesia with relative akinesia involving the septum. Left Ventricular Ejection Fraction: 24 % End diastolic volume 789 ml End systolic volume 96 ml IMPRESSION: 1. Suspected areas of pharmacologically induced ischemia involving the lateral wall of the left ventricle and septum. 2. No definitive scintigraphic evidence of prior infarction. 3. Moderate left ventricular dilatation. 4. Severe left ventricular global hypokinesia with relative akinesia of the septum. Ejection fraction - 24% Electronically Signed   By: Sandi Mariscal M.D.   On: 08/22/2018 14:27    Lab Data:  CBC: Recent Labs  Lab 08/20/18 2200 08/21/18 0412 08/22/18 0132 08/23/18 0419  WBC 10.6* 9.8 9.1 11.1*  HGB 11.6* 11.5* 10.8* 11.6*  HCT 37.2 38.0 35.1* 39.3  MCV 85.7 87.2 85.6 87.1  PLT 195 187 135* 993   Basic Metabolic Panel: Recent Labs  Lab 08/20/18 2200 08/21/18 0412 08/22/18 0132 08/23/18 0419 08/23/18 1132  NA 133* 139 134* 136 135  K 5.5* 5.2* 5.3* 5.2* 5.0  CL 107 112* 108 110 111  CO2 18* 18* 19* 17* 18*  GLUCOSE 389* 192* 430* 260* 387*  BUN 46* 41* 40* 44* 44*  CREATININE 1.71* 1.57* 1.76* 1.85* 1.79*  CALCIUM 8.9 10.4* 8.7* 9.0 8.9  MG  --   --  1.6* 1.6*  --    GFR: Estimated Creatinine Clearance: 28.1 mL/min (A) (by C-G formula based on SCr of 1.79 mg/dL (H)). Liver Function Tests: No results for input(s): AST, ALT, ALKPHOS, BILITOT, PROT, ALBUMIN in the last 168 hours. No results for input(s): LIPASE, AMYLASE in the last 168 hours. No results for input(s): AMMONIA in the last 168 hours. Coagulation Profile: No results for input(s): INR, PROTIME in the  last 168 hours. Cardiac Enzymes: Recent Labs  Lab 08/21/18 0412 08/21/18 1127 08/21/18 1822 08/22/18 0132  TROPONINI <0.03 <0.03 <0.03 <0.03   BNP (last 3 results) No results for input(s): PROBNP in the last 8760 hours. HbA1C: Recent Labs    08/22/18 0132  HGBA1C 9.3*   CBG: Recent Labs  Lab 08/22/18 1646 08/22/18 1817 08/22/18 2041 08/22/18 2219 08/23/18 0717  GLUCAP 473* 428* 235* 153* 324*   Lipid Profile: No results for input(s): CHOL, HDL, LDLCALC, TRIG, CHOLHDL, LDLDIRECT in the last 72 hours. Thyroid Function Tests: No results for input(s): TSH, T4TOTAL, FREET4, T3FREE, THYROIDAB in the last 72 hours. Anemia Panel: No results for input(s): VITAMINB12, FOLATE, FERRITIN, TIBC, IRON, RETICCTPCT in the last 72 hours. Urine analysis: No results found for: COLORURINE, APPEARANCEUR, LABSPEC, PHURINE, GLUCOSEU, HGBUR, BILIRUBINUR, KETONESUR, PROTEINUR, UROBILINOGEN, NITRITE, LEUKOCYTESUR   Ripudeep Rai M.D. [facility] 08/23/2018, 12:32 PM  Pager: [phone] Between 7am to 7pm - call Pager - [phone]  After 7pm go to [email or web address] - password [email or web address]  Call night coverage person covering after 7pm

## 2018-08-24 ENCOUNTER — Other Ambulatory Visit: Payer: Self-pay | Admitting: *Deleted

## 2018-08-24 ENCOUNTER — Encounter (HOSPITAL_COMMUNITY): Payer: Self-pay | Admitting: Cardiology

## 2018-08-24 DIAGNOSIS — E872 Acidosis: Secondary | ICD-10-CM

## 2018-08-24 DIAGNOSIS — N390 Urinary tract infection, site not specified: Secondary | ICD-10-CM

## 2018-08-24 DIAGNOSIS — I2510 Atherosclerotic heart disease of native coronary artery without angina pectoris: Secondary | ICD-10-CM

## 2018-08-24 LAB — LIPID PANEL
CHOL/HDL RATIO: 3.7 ratio
Cholesterol: 116 mg/dL (ref 0–200)
HDL: 31 mg/dL — AB (ref >40)
LDL CALC: 51 mg/dL (ref 0–99)
TRIGLYCERIDES: 171 mg/dL — AB (ref <150)
VLDL: 34 mg/dL (ref 0–40)

## 2018-08-24 LAB — BASIC METABOLIC PANEL
ANION GAP: 8 (ref 5–15)
Anion gap: 8 (ref 5–15)
BUN: 34 mg/dL — AB (ref 8–23)
BUN: 31 mg/dL — AB (ref 8–23)
CALCIUM: 8.9 mg/dL (ref 8.9–10.3)
CO2: 20 mmol/L — ABNORMAL LOW (ref 22–32)
CO2: 15 mmol/L — ABNORMAL LOW (ref 22–32)
CREATININE: 1.57 mg/dL — AB (ref 0.44–1.00)
Calcium: 9.1 mg/dL (ref 8.9–10.3)
Chloride: 112 mmol/L — ABNORMAL HIGH (ref 98–111)
Chloride: 117 mmol/L — ABNORMAL HIGH (ref 98–111)
Creatinine, Ser: 1.57 mg/dL — ABNORMAL HIGH (ref 0.44–1.00)
GFR calc Af Amer: 39 mL/min — ABNORMAL LOW (ref >60)
GFR calc Af Amer: 39 mL/min — ABNORMAL LOW (ref >60)
GFR calc non Af Amer: 34 mL/min — ABNORMAL LOW (ref >60)
GFR, EST NON AFRICAN AMERICAN: 34 mL/min — AB (ref >60)
GLUCOSE: 160 mg/dL — AB (ref 70–99)
Glucose, Bld: 161 mg/dL — ABNORMAL HIGH (ref 70–99)
POTASSIUM: 4.6 mmol/L (ref 3.5–5.1)
Potassium: 4.9 mmol/L (ref 3.5–5.1)
Sodium: 140 mmol/L (ref 135–145)
Sodium: 140 mmol/L (ref 135–145)

## 2018-08-24 LAB — URINALYSIS, ROUTINE W REFLEX MICROSCOPIC
Bilirubin Urine: NEGATIVE
Glucose, UA: NEGATIVE mg/dL
KETONES UR: NEGATIVE mg/dL
Nitrite: NEGATIVE
PH: 5 (ref 5.0–8.0)
PROTEIN: 100 mg/dL — AB
Specific Gravity, Urine: 1.012 (ref 1.005–1.030)
WBC, UA: >50 WBC/hpf — ABNORMAL HIGH (ref 0–5)

## 2018-08-24 LAB — CBC
HCT: 40.3 % (ref 36.0–46.0)
Hemoglobin: 12.2 g/dL (ref 12.0–15.0)
MCH: 26.3 pg (ref 26.0–34.0)
MCHC: 30.3 g/dL (ref 30.0–36.0)
MCV: 86.9 fL (ref 80.0–100.0)
PLATELETS: 150 10*3/uL (ref 150–400)
RBC: 4.64 MIL/uL (ref 3.87–5.11)
RDW: 14.3 % (ref 11.5–15.5)
WBC: 10.2 10*3/uL (ref 4.0–10.5)
nRBC: 0 % (ref 0.0–0.2)

## 2018-08-24 LAB — HEPATIC FUNCTION PANEL
ALT: 14 U/L (ref 0–44)
AST: 13 U/L — ABNORMAL LOW (ref 15–41)
Albumin: 2.9 g/dL — ABNORMAL LOW (ref 3.5–5.0)
Alkaline Phosphatase: 93 U/L (ref 38–126)
Bilirubin, Direct: <0.1 mg/dL (ref 0.0–0.2)
Total Bilirubin: 0.4 mg/dL (ref 0.3–1.2)
Total Protein: 6.2 g/dL — ABNORMAL LOW (ref 6.5–8.1)

## 2018-08-24 LAB — GLUCOSE, CAPILLARY
GLUCOSE-CAPILLARY: 158 mg/dL — AB (ref 70–99)
Glucose-Capillary: 153 mg/dL — ABNORMAL HIGH (ref 70–99)
Glucose-Capillary: 270 mg/dL — ABNORMAL HIGH (ref 70–99)
Glucose-Capillary: 319 mg/dL — ABNORMAL HIGH (ref 70–99)

## 2018-08-24 LAB — HEPARIN LEVEL (UNFRACTIONATED)
HEPARIN UNFRACTIONATED: 0.29 [IU]/mL — AB (ref 0.30–0.70)
Heparin Unfractionated: 0.28 IU/mL — ABNORMAL LOW (ref 0.30–0.70)

## 2018-08-24 LAB — MAGNESIUM: MAGNESIUM: 2.3 mg/dL (ref 1.7–2.4)

## 2018-08-24 MED ORDER — SODIUM CHLORIDE 0.9 % IV SOLN
1.0000 g | INTRAVENOUS | Status: DC
  Administered 2018-08-24 – 2018-08-26 (×3): 1 g via INTRAVENOUS
  Filled 2018-08-24 (×5): qty 10

## 2018-08-24 MED ORDER — INSULIN GLARGINE 100 UNIT/ML ~~LOC~~ SOLN
25.0000 [IU] | Freq: Every day | SUBCUTANEOUS | Status: DC
  Administered 2018-08-24: 25 [IU] via SUBCUTANEOUS
  Filled 2018-08-24 (×3): qty 0.25

## 2018-08-24 MED ORDER — ATORVASTATIN CALCIUM 80 MG PO TABS
80.0000 mg | ORAL_TABLET | Freq: Every day | ORAL | Status: DC
  Administered 2018-08-24 – 2018-08-28 (×5): 80 mg via ORAL
  Filled 2018-08-24 (×5): qty 1

## 2018-08-24 MED ORDER — PANTOPRAZOLE SODIUM 40 MG PO TBEC
40.0000 mg | DELAYED_RELEASE_TABLET | Freq: Two times a day (BID) | ORAL | Status: DC
  Administered 2018-08-24 – 2018-08-28 (×9): 40 mg via ORAL
  Filled 2018-08-24 (×9): qty 1

## 2018-08-24 MED FILL — Heparin Sod (Porcine)-NaCl IV Soln 1000 Unit/500ML-0.9%: INTRAVENOUS | Qty: 1000 | Status: AC

## 2018-08-24 MED FILL — Verapamil HCl IV Soln 2.5 MG/ML: INTRAVENOUS | Qty: 4 | Status: AC

## 2018-08-24 NOTE — Progress Notes (Addendum)
Progress Note  Patient Name: Joy Boyd Date of Encounter: 08/24/2018  Primary Cardiologist: No primary care provider on file.   Subjective   No acute events overnight. Doing well this morning. Denies chest pain and shortness of breath. Did have epigastric overnight that resolved after a GI cocktail. She is also complaining of dysuria.   Inpatient Medications    Scheduled Meds: . amLODipine  2.5 mg Oral Daily  . aspirin EC  81 mg Oral Daily  . atorvastatin  20 mg Oral Daily  . azithromycin  250 mg Oral Daily  . bisoprolol  10 mg Oral Daily  . budesonide  0.25 mg Inhalation BID  . guaiFENesin  600 mg Oral BID  . insulin aspart  0-9 Units Subcutaneous TID WC  . insulin aspart protamine- aspart  15 Units Subcutaneous Q supper  . insulin aspart protamine- aspart  25 Units Subcutaneous Q breakfast  . pantoprazole  40 mg Oral BID AC  . sodium chloride flush  3 mL Intravenous Q12H   Continuous Infusions: . sodium chloride    . heparin 850 Units/hr (08/24/18 0700)   PRN Meds: sodium chloride, alum & mag hydroxide-simeth, guaiFENesin-dextromethorphan, hydrALAZINE, ipratropium-albuterol, nitroGLYCERIN, ondansetron (ZOFRAN) IV, sodium chloride flush   Vital Signs    Vitals:   08/24/18 0400 08/24/18 0500 08/24/18 0600 08/24/18 0700  BP: 132/60 130/69 126/74 (!) 148/97  Pulse: (!) 51 (!) 52 (!) 55 (!) 55  Resp: 14 13 14 12   Temp: 97.7 F (36.5 C)   98.2 F (36.8 C)  TempSrc: Oral   Oral  SpO2: 97% 97% 96% 97%  Weight:      Height:        Intake/Output Summary (Last 24 hours) at 08/24/2018 0849 Last data filed at 08/24/2018 0700 Gross per 24 hour  Intake 1223.49 ml  Output 800 ml  Net 423.49 ml   Filed Weights   08/22/18 1600 08/22/18 2133 08/23/18 0621  Weight: 72.6 kg 70.2 kg 70.2 kg    Telemetry    Frequent PVCs, ventricular bigeminy - Personally Reviewed  ECG    None performed today - Personally Reviewed  Physical Exam   GEN: tired-appearing female,  in no acute distress.   Neck: No JVD Cardiac: RRR with frequent PVCs, no murmurs, rubs, or gallops.  Respiratory: Clear to auscultation bilaterally. GI: Soft, nontender, non-distended  MS: No edema; No deformity. Neuro:  no focal deficits Psych: Normal affect   Labs    Chemistry Recent Labs  Lab 08/23/18 1132 08/23/18 1438 08/24/18 0126  NA 135 137 140  K 5.0 5.8* 4.6  CL 111 113* 112*  CO2 18* 17* 20*  GLUCOSE 387* 240* 161*  BUN 44* 41* 34*  CREATININE 1.79* 1.63* 1.57*  CALCIUM 8.9 8.9 9.1  GFRNONAA 29* 32* 34*  GFRAA 33* 37* 39*  ANIONGAP 6 7 8      Hematology Recent Labs  Lab 08/21/18 0412 08/22/18 0132 08/23/18 0419  WBC 9.8 9.1 11.1*  RBC 4.36 4.10 4.51  HGB 11.5* 10.8* 11.6*  HCT 38.0 35.1* 39.3  MCV 87.2 85.6 87.1  MCH 26.4 26.3 25.7*  MCHC 30.3 30.8 29.5*  RDW 14.3 14.3 14.3  PLT 187 135* 165    Cardiac Enzymes Recent Labs  Lab 08/21/18 0412 08/21/18 1127 08/21/18 1822 08/22/18 0132  TROPONINI <0.03 <0.03 <0.03 <0.03    Recent Labs  Lab 08/20/18 2209  TROPIPOC 0.02     BNP Recent Labs  Lab 08/21/18 0358  BNP 532.8*  DDimer  Recent Labs  Lab 08/21/18 1127  DDIMER 1.07*     Radiology    Nm Myocar Multi W/spect W/wall Motion / Ef  Result Date: 08/22/2018 CLINICAL DATA:  Chest pain and shortness of breath. History of CAD post previous coronary stent placement. History of diabetes and hypertension. EXAM: MYOCARDIAL IMAGING WITH SPECT (REST AND PHARMACOLOGIC-STRESS) GATED LEFT VENTRICULAR WALL MOTION STUDY LEFT VENTRICULAR EJECTION FRACTION TECHNIQUE: Standard myocardial SPECT imaging was performed after resting intravenous injection of 10 mCi Tc-84m tetrofosmin. Subsequently, intravenous infusion of Lexiscan was performed under the supervision of the Cardiology staff. At peak effect of the drug, 30 mCi Tc-18m tetrofosmin was injected intravenously and standard myocardial SPECT imaging was performed. Quantitative gated imaging was  also performed to evaluate left ventricular wall motion, and estimate left ventricular ejection fraction. COMPARISON:  Chest CT-08/21/2018 FINDINGS: Raw images: Mild GI attenuation is seen worse on the provided stress images. No significant breast or chest wall attenuation. No significant patient motion artifact. Perfusion: There is a moderate sized area of decreased perfusion involving the lateral wall of the left ventricle as well as a small area of decreased profusion involving the apical aspect of the septum on the provided stress images worrisome for areas of pharmacologically induced ischemia. No definitive matched areas of decreased perfusion to suggest prior infarction. Wall Motion: The left ventricle is moderately dilated. There is severe global hypokinesia with relative akinesia involving the septum. Left Ventricular Ejection Fraction: 24 % End diastolic volume 330 ml End systolic volume 96 ml IMPRESSION: 1. Suspected areas of pharmacologically induced ischemia involving the lateral wall of the left ventricle and septum. 2. No definitive scintigraphic evidence of prior infarction. 3. Moderate left ventricular dilatation. 4. Severe left ventricular global hypokinesia with relative akinesia of the septum. Ejection fraction - 24% Electronically Signed   By: Sandi Mariscal M.D.   On: 08/22/2018 14:27   Vas [place] Burnard Bunting With/wo Tbi  Result Date: 08/22/2018 LOWER EXTREMITY DOPPLER STUDY Indications: Claudication.  Performing Technologist: Carlos Levering RVT  Examination Guidelines: A complete evaluation includes at minimum, Doppler waveform signals and systolic blood pressure reading at the level of bilateral brachial, anterior tibial, and posterior tibial arteries, when vessel segments are accessible. Bilateral testing is considered an integral part of a complete examination. Photoelectric Plethysmograph (PPG) waveforms and toe systolic pressure readings are included as required and additional duplex testing as  needed. Limited examinations for reoccurring indications may be performed as noted.  ABI Findings: +--------+------------------+-----+---------+--------+ Right   Rt Pressure (mmHg)IndexWaveform Comment  +--------+------------------+-----+---------+--------+ Brachial130                    triphasic         +--------+------------------+-----+---------+--------+ PTA     157               1.05 triphasic         +--------+------------------+-----+---------+--------+ DP      143               0.95 triphasic         +--------+------------------+-----+---------+--------+ +--------+------------------+-----+---------+-------+ Left    Lt Pressure (mmHg)IndexWaveform Comment +--------+------------------+-----+---------+-------+ Brachial150                    triphasic        +--------+------------------+-----+---------+-------+ PTA     171               1.14 triphasic        +--------+------------------+-----+---------+-------+ DP  145               0.97 triphasic        +--------+------------------+-----+---------+-------+ +-------+-----------+-----------+------------+------------+ ABI/TBIToday's ABIToday's TBIPrevious ABIPrevious TBI +-------+-----------+-----------+------------+------------+ Right  1.05                                           +-------+-----------+-----------+------------+------------+ Left   1.14                                           +-------+-----------+-----------+------------+------------+  Summary: Right: Resting right ankle-brachial index is within normal range. No evidence of significant right lower extremity arterial disease. Left: Resting left ankle-brachial index is within normal range. No evidence of significant left lower extremity arterial disease.  *See table(s) above for measurements and observations.  Electronically signed by Deitra Mayo MD on 08/22/2018 at 5:27:53 PM.   Final     Cardiac Studies   Myoview  08/22/2018:  IMPRESSION: 1. Suspected areas of pharmacologically induced ischemia involving the lateral wall of the left ventricle and septum. 2. No definitive scintigraphic evidence of prior infarction. 3. Moderate left ventricular dilatation. 4. Severe left ventricular global hypokinesia with relative akinesia of the septum. Ejection fraction - 24%  Echocardiogram 08/21/2018:   Study Conclusions - Left ventricle: The cavity size was at the upper limits of   normal. Wall thickness was increased in a pattern of mild LVH.   Systolic function was severely reduced. The estimated ejection   fraction was in the range of 15% to 20%. Diffuse hypokinesis.   Features are consistent with a pseudonormal left ventricular   filling pattern, with concomitant abnormal relaxation and   increased filling pressure (grade 2 diastolic dysfunction). - Mitral valve: There was mild regurgitation. Valve area by   pressure half-time: 1.95 cm^2. - Left atrium: The atrium was moderately dilated. - Right ventricle: Systolic function was mildly reduced. - Right atrium: The atrium was mildly to moderately dilated. - Pulmonary arteries: PA peak pressure: 34 mm Hg (S). - Impressions: There is sevre global LV dysfunction in the setting   of very frequent PVCs. 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  Impressions: - There is sevre global LV dysfunction in the setting of very   frequent PVCs. 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  LHC 08/23/2018:   Prox RCA lesion is 40% stenosed.  Previously placed Prox RCA to Mid RCA stent is 5% stenosed. Mid RCA to Dist RCA stent (unknown type) is widely patent.  Dist RCA lesion is 40% stenosed. Post Atrio lesion is 65% stenosed.  Mid LM to Ost LAD lesion is 85% stenosed with 75% stenosed side branch in Ost Cx.  Previously placed Prox LAD to Mid LAD stent (unknown type) is widely patent. Mid LAD stent is 15% stenosed.  Mid LAD to Dist LAD lesion is 45%  stenosed.  Previously placed Prox Cx to Mid Cx stent is 5% stenosed.  LV end diastolic pressure is moderately elevated.   SUMMARY  Severe distal left main disease involving the ostium of both LAD and circumflex.  Mild in-stent restenosis in the LAD, circumflex and RCA stents with moderate disease beyond the stent in the LAD.  Echocardiographically severe Cardiomyopathy with Ejection Fraction of roughly 20%.    LVEDP 20 mmHg -she seems relatively euvolemic but has obvious combined systolic and  diastolic heart failure.  Patient Profile     67 y.o. female with PMH of CAD s/p PCI to RCA, LCx, and LAD (most recent one in 01/2018), HTN, uncontrolled T2DM, and HLD who presented with one month of productive cough and 1 week of atypical chest pain.   Assessment & Plan    1. CAD s/p PCI to RCA, LCx, and LAD (most recent one to RCA in 01/2018) with angina: Ms. Cavan presents with one week history of atypical chest pain. Given her history of cardiac disease and risk factors, she had a stress test for further evaluation which showed septal and lateral ischemia as well as global hypokinesis and a low EF. She then underwent LHC yesterday which showed severe LM disease involving the ostium of LAD and LCx as well as mild in-stent restenosis in all her stents with moderate disease beyond the stent in the LAD. Balloon pump was not placed as she was hemodynamically stable and had no angina. Her echocardiogram showed EF of 15% with diffuse hypokinesis, G2DD, and reduced RV function. CVTS has been consulted for evaluation for CABG given severe LM disease. However, risks may outweigh the benefits in the setting of reduced EF. If not a surgical candidate, her other option would be high risk PCI to LM which would require an Impella support device due to reduced EF. She is on a baby aspirin and heparin gtt. Plavix was discontinued yesterday pending surgical consultation. She is also on a beta blocker. Not a candidate for  ACEi/ARB/ARNi due to renal insufficiency.   2. Chronic combined systolic and diastolic HF:  Likely ischemic in nature as well as PVC-induced. She presented with 1 week of atypical chest and 1 month of productive cough. BNP 532. CXR without pulmonary edema.   Echocardiogram showed EF 15% with global hypokinesis, G2DD, and reduced RV function. This is reduced from 05/2018 when EF was 45%. She appears euvolemic on exam. Her bisoprolol was increased to 10 mg yesterday and she is on amlodipine 2.5 mg. Not a candidate for ACEi/ARB/ARNi due to chronic renal insufficiency.   3. CKD Stage II/III: No previous data in chart. Baseline appears to be 1.5. Cr 1.5 this AM. Will continue to monitor.    4. Uncontrolled, insulin-dependent T2DM: On NPH and SSI. Per primary.   5. HLD: On atorvastatin 20mg  QD. Increased it to 80 mg QD as she should be on high-intensity statin.   6. Dysuria: Checking UA. Per primary.      For questions or updates, please contact [place] Please consult [email or web address] for contact info under      Signed, Welford Roche, MD  08/24/2018, 8:49 AM    I have personally seen and examined this patient with Dr. Isac Sarna. I agree with the assessment and plan as outlined above. She has multi-vessel CAD with stenting of all vessels in [place]. She has HTN and uncontrolled DM. She is admitted with chest pain and cough. Troponin was negative. Echo 08/21/18 with LVEF=15-20%, mild MR. Her LVEF had been reported at 45% in August 2019 at the time of stenting of her LAD and circumflex in August 2019. Cardiac cath 08/23/18 per Dr. Ellyn Hack with severe stenosis in the distal left main artery involving the ostium of the LAD and circumflex. Severe restenosis distal RCA stent. CT surgery has been consulted for CABG. I agree that CABG is the best revascularization option for this young patient with severe left main/LAD/Circumflex stenosis as well as RCA stenosis. She has uncontrolled diabetes  and  evidence of stent restenosis suggesting she may do poorly long term with a left main stent. Her reduced LV systolic function will make her high risk for PCI or CABG. If PCI was pursued, this would likely need to involve use of an Impella for support. Will review with IC team today. We will continue ASA, statin, beta blocker and IV heparin while awaiting input regarding possible CABG from our surgical team. Her Plavix was held yesterday so she would need Plavix washout prior to CABG.  Family updated at bedside. Her son interprets for [place].   Lauree Chandler 08/24/2018 10:45 AM

## 2018-08-24 NOTE — Progress Notes (Signed)
ANTICOAGULATION CONSULT NOTE - Follow Up Consult  Pharmacy Consult for Heparin Indication: chest pain/ACS  No Known Allergies  Patient Measurements: Height: 5\' 1"  (154.9 cm) Weight: 154 lb 12.8 oz (70.2 kg) IBW/kg (Calculated) : 47.8 Heparin Dosing Weight: 62.9 kg  Vital Signs: Temp: 98.1 F (36.7 C) (10/30 1540) Temp Source: Oral (10/30 0700) BP: 149/85 (10/30 1700) Pulse Rate: 70 (10/30 1700)  Labs: Recent Labs    08/21/18 1822  08/22/18 0132 08/23/18 0419  08/23/18 1438 08/24/18 0126 08/24/18 0753 08/24/18 1547  HGB  --    < > 10.8* 11.6*  --   --   --  12.2  --   HCT  --   --  35.1* 39.3  --   --   --  40.3  --   PLT  --   --  135* 165  --   --   --  150  --   HEPARINUNFRC  --   --   --   --   --   --   --  0.29* 0.28*  CREATININE  --    < > 1.76* 1.85*   < > 1.63* 1.57* 1.57*  --   TROPONINI <0.03  --  <0.03  --   --   --   --   --   --    < > = values in this interval not displayed.    Estimated Creatinine Clearance: 32 mL/min (A) (by C-G formula based on SCr of 1.57 mg/dL (H)).   Assessment: [name] admitted with CP, SOB, and Hx of CAD s/p multiple stents that have restenosed. Initiated heparin while waiting decision regarding CABG v PCI.   HL = 20.8  slightly subtherapeutic on Heparin 900 units/hr. CBC stable. No signs of bleeding or infusion issues per nursing.   Goal of Therapy:  Heparin level 0.3-0.7 units/ml Monitor platelets by anticoagulation protocol: Yes   Plan:  - Increase Heparin to 1000 units/hr - Continue to monitor CBC and signs of bleeding  Bonnita Nasuti Pharm.D. CPP, BCPS Clinical Pharmacist [phone] 08/24/2018 6:02 PM

## 2018-08-24 NOTE — Progress Notes (Signed)
[facility]                                                                              Patient Demographics  Joy Boyd, is a 67 y.o. female, DOB - 07/01/1951, HQP:[id number]  Admit date - 08/20/2018   Admitting Physician Colbert Ewing, MD  Outpatient Primary MD for the patient is Patient, No Pcp Per  Outpatient specialists:   LOS - 2  days   Medical records reviewed and are as summarized below:  Copied PN forward from Dr. Tana Coast from 10/30 and edited for changes.  Chief Complaint  Patient presents with  . Chest Pain  . Cough       Brief summary    Briefly 67 year old female, visiting her son and daughter-in-law from [place], came a month ago, with history of hypertension, multivessel CAD with recent stents to RCA, left circumflex and LAD,  cath in April 2019 presented with increasing dyspnea, productive cough for 1 months, atypical chest pain, at times pleuritic and radiating to the left arm.  Patient reports productive yellowish phlegm and reflux symptoms.  She has been compliant with all her medications that she brought from [place].  Patient reports that on lying down, she feels chest tightness however also states that her cough gets worse on lying.  States legs hurt on walking however no peripheral edema. No fevers or chills, no smoking history, secondhand smoking from her husband however she states that he does not smoke in the house.  She came from [place] a month ago, more than 18-hour flight.   Assessment & Plan    Principal Problem:    Unstable angina [facility]): In the setting of recent stents, multivessel CAD, acute bronchitis, GERD - d-dimer was elevated, CT angiogram of the chest negative for PE  - 2D echo showed EF of 15 to 20%, global hypokinesis, per son at the bedside EF was 45% in April 2019  - Patient underwent stress test which showed pharmacologically induced ischemia in the lateral wall of left ventricle and septum, moderate left  ventricular dilatation, severe left ventricular global hypokinesia with relative akinesia of the septum, EF 24%. - Continue aspirin, statins, beta-blockers and IV heparin pending input regarding CABG from TCTS team.  Plavix held 10/29 and will keep it on hold in case surgery planned. -Cardiac cath 10/29 showed severe stenosis in the distal left main artery involving the ostium of the LAD and circumflex.  Severe restenosis of distal RCA stent.  Cardiology felt that CABG was her best revascularization option.  Cardiology plans to review with IC team  Active problems Acute bronchitis Improving, continue nebs, Zithromax, Robitussin, Mucinex  GERD -Continue PPI, Maalox as needed.  Patient reported epigastric pain this morning, seems to have relieved with Maalox.  Increased Protonix to twice daily.  Uncontrolled diabetes mellitus with hyperglycemia, type II, insulin-dependent Hemoglobin A1c 9.3 Diabetes coordinator assisting with management.  Had hypoglycemic episode/CBG 48 on 10/29 afternoon, possibly related to poor oral intake for cath, receiving insulins later than supposed to.  As per diabetes coordinator recommendation, given fluctuating oral intake, transitioned to Lantus 24 units at bedtime along with SSI.  Monitor closely.  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  Creatinine has improved to 1.5.  Follow BMP in a.m.  Bicarbonate however has dropped from 17-15, unclear etiology-could be related to hyperchloremia.  Anion gap normal.  Essential hypertension Continue beta-blocker, amlodipine, no ACE/ARB/Entresto secondary to renal insufficiency  Hyperlipidemia Continue statins  Chronic combined systolic and diastolic CHF: Clinically euvolemic.  Dysuria/suspected acute lower UTI. Reported on 10/30.  Urine microscopy suspicious for UTI.  Started IV ceftriaxone pending culture results.   Code Status:  Full code DVT Prophylaxis: Currently on IV heparin infusion. Family Communication: Discussed in detail with patient's son at bedside, updated care and answered questions.   Disposition Plan: To be determined pending further evaluation and management.  Time Spent in minutes   35 minutes  Procedures:  2D echo Nuclear medicine stress test; results as above  Consultants:   Cardiology TCTS  Antimicrobials:  Zithromax IV ceftriaxone 10/30 >  Medications  Scheduled Meds: . amLODipine  2.5 mg Oral Daily  . aspirin EC  81 mg Oral Daily  . atorvastatin  80 mg Oral Daily  . azithromycin  250 mg Oral Daily  . bisoprolol  10 mg Oral Daily  . budesonide  0.25 mg Inhalation BID  . guaiFENesin  600 mg Oral BID  . insulin aspart  0-9 Units Subcutaneous TID WC  . insulin glargine  25 Units Subcutaneous QHS  . pantoprazole  40 mg Oral BID AC  . sodium chloride flush  3 mL Intravenous Q12H   Continuous Infusions: . sodium chloride    . heparin 900 Units/hr (08/24/18 1600)   PRN Meds:.sodium chloride, alum & mag hydroxide-simeth, guaiFENesin-dextromethorphan, hydrALAZINE, ipratropium-albuterol, nitroGLYCERIN, ondansetron (ZOFRAN) IV, sodium chloride flush   Antibiotics   Anti-infectives (From admission, onward)   Start     Dose/Rate Route Frequency Ordered Stop   08/22/18 1000  azithromycin (ZITHROMAX) tablet 250 mg     250 mg Oral Daily 08/21/18 0405 08/26/18 0959   08/21/18 0500  azithromycin (ZITHROMAX) tablet 500 mg     500 mg Oral Daily 08/21/18 0405 08/21/18 0606        Subjective:   Patient was interviewed and examined along with her son and RN at bedside.  No further chest pressure.  However reports new epigastric 2/10 nonradiating pain that started sometime overnight or this morning.  This pain is not similar to her reflux pain.  No dyspnea or cough.  Also reports dysuria without fever or chills.  Objective:   Vitals:   08/24/18 1200 08/24/18 1500 08/24/18 1540  08/24/18 1600  BP: (!) 152/89 114/60  132/65  Pulse: (!) 57 64  (!) 57  Resp: 18 20  17   Temp:   98.1 F (36.7 C)   TempSrc:      SpO2: 98% 98%  99%  Weight:      Height:        Intake/Output Summary (Last 24 hours) at 08/24/2018 1659 Last data filed at 08/24/2018 1600 Gross per 24 hour  Intake 1055.61 ml  Output 800 ml  Net 255.61 ml     Wt Readings from Last 3 Encounters:  08/23/18 70.2 kg     Exam  General: Pleasant middle-aged female, moderately built and overweight lying comfortably propped up in bed.  Cardiovascular: S1 S2 auscultated, Regular rate and rhythm.  No JVD, murmurs or pedal edema.  Telemetry personally reviewed: Sinus rhythm.  Occasional quadrigeminy.  Respiratory: Clear to  auscultation bilaterally, no wheezing, rales or rhonchi.  No increased work of breathing.  Gastrointestinal: Nondistended and soft.  Mild epigastric tenderness without rigidity, guarding or rebound.  No organomegaly or masses appreciated.  Normal bowel sounds heard.  Ext: Symmetric 5 x 5 power.  Neuro: Alert and oriented x3.  No focal neurological deficits.  Psych: Normal affect and demeanor.   Data Reviewed:  I have personally reviewed following labs and imaging studies  Micro Results No results found for this or any previous visit (from the past 240 hour(s)).  Radiology Reports Dg Chest 2 View  Result Date: 08/20/2018 CLINICAL DATA:  Cough and chest pain EXAM: CHEST - 2 VIEW COMPARISON:  None. FINDINGS: Lungs are clear. Heart size and pulmonary vascularity are normal. No adenopathy. There is aortic atherosclerosis. There are multiple foci of coronary artery calcification. No bone lesions. IMPRESSION: Multifocal coronary artery calcification. Aortic atherosclerosis. No edema or consolidation. Heart size normal. Electronically Signed   By: Lowella Grip III M.D.   On: 08/20/2018 23:09   Ct Angio Chest Pe W Or Wo Contrast  Result Date: 08/21/2018 CLINICAL DATA:   Increased dyspnea and chest pain over the past month. Pt also having productive cough. Elevated d-dimer. Symptoms began after a long plane ride from [place] to here to visit her son. EXAM: CT ANGIOGRAPHY CHEST WITH CONTRAST TECHNIQUE: Multidetector CT imaging of the chest was performed using the standard protocol during bolus administration of intravenous contrast. Multiplanar CT image reconstructions and MIPs were obtained to evaluate the vascular anatomy. CONTRAST:  80mL ISOVUE-370 IOPAMIDOL (ISOVUE-370) INJECTION 76% COMPARISON:  Chest radiographs, 08/20/2018. FINDINGS: Cardiovascular: There is satisfactory opacification of the pulmonary arteries to the segmental level. There is no evidence of a pulmonary embolism. Heart is mildly enlarged. Three-vessel coronary artery calcifications. No pericardial effusion. Mild aortic atherosclerotic calcifications. Mediastinum/Nodes: No neck base, axillary, mediastinal or hilar masses or enlarged lymph nodes. Trachea and esophagus are unremarkable. Lungs/Pleura: No evidence of pneumonia or pulmonary edema. Mild bronchial wall thickening in the lower lobes. Mild dependent subsegmental atelectasis most evident at the left posterolateral lung base. No lung mass or suspicious nodule. No pleural effusion or pneumothorax. Upper Abdomen: No acute abnormality. Musculoskeletal: No chest wall abnormality. No acute or significant osseous findings. Review of the MIP images confirms the above findings. IMPRESSION: 1. No evidence of a pulmonary embolism. 2. Mild cardiomegaly. Coronary artery calcifications and aortic atherosclerosis. 3. No evidence of pneumonia or pulmonary edema. Mild bronchial wall thickening in the lower lobes. This is consistent with acute bronchial infection/inflammation given the history of productive cough. Aortic Atherosclerosis (ICD10-I70.0). Electronically Signed   By: Lajean Manes M.D.   On: 08/21/2018 17:02   Nm Myocar Multi W/spect W/wall Motion /  Ef  Result Date: 08/22/2018 CLINICAL DATA:  Chest pain and shortness of breath. History of CAD post previous coronary stent placement. History of diabetes and hypertension. EXAM: MYOCARDIAL IMAGING WITH SPECT (REST AND PHARMACOLOGIC-STRESS) GATED LEFT VENTRICULAR WALL MOTION STUDY LEFT VENTRICULAR EJECTION FRACTION TECHNIQUE: Standard myocardial SPECT imaging was performed after resting intravenous injection of 10 mCi Tc-71m tetrofosmin. Subsequently, intravenous infusion of Lexiscan was performed under the supervision of the Cardiology staff. At peak effect of the drug, 30 mCi Tc-13m tetrofosmin was injected intravenously and standard myocardial SPECT imaging was performed. Quantitative gated imaging was also performed to evaluate left ventricular wall motion, and estimate left ventricular ejection fraction. COMPARISON:  Chest CT-08/21/2018 FINDINGS: Raw images: Mild GI attenuation is seen worse on the provided stress images. No  significant breast or chest wall attenuation. No significant patient motion artifact. Perfusion: There is a moderate sized area of decreased perfusion involving the lateral wall of the left ventricle as well as a small area of decreased profusion involving the apical aspect of the septum on the provided stress images worrisome for areas of pharmacologically induced ischemia. No definitive matched areas of decreased perfusion to suggest prior infarction. Wall Motion: The left ventricle is moderately dilated. There is severe global hypokinesia with relative akinesia involving the septum. Left Ventricular Ejection Fraction: 24 % End diastolic volume 630 ml End systolic volume 96 ml IMPRESSION: 1. Suspected areas of pharmacologically induced ischemia involving the lateral wall of the left ventricle and septum. 2. No definitive scintigraphic evidence of prior infarction. 3. Moderate left ventricular dilatation. 4. Severe left ventricular global hypokinesia with relative akinesia of the  septum. Ejection fraction - 24% Electronically Signed   By: Sandi Mariscal M.D.   On: 08/22/2018 14:27   Vas [place] Burnard Bunting With/wo Tbi  Result Date: 08/22/2018 LOWER EXTREMITY DOPPLER STUDY Indications: Claudication.  Performing Technologist: Carlos Levering RVT  Examination Guidelines: A complete evaluation includes at minimum, Doppler waveform signals and systolic blood pressure reading at the level of bilateral brachial, anterior tibial, and posterior tibial arteries, when vessel segments are accessible. Bilateral testing is considered an integral part of a complete examination. Photoelectric Plethysmograph (PPG) waveforms and toe systolic pressure readings are included as required and additional duplex testing as needed. Limited examinations for reoccurring indications may be performed as noted.  ABI Findings: +--------+------------------+-----+---------+--------+ Right   Rt Pressure (mmHg)IndexWaveform Comment  +--------+------------------+-----+---------+--------+ Brachial130                    triphasic         +--------+------------------+-----+---------+--------+ PTA     157               1.05 triphasic         +--------+------------------+-----+---------+--------+ DP      143               0.95 triphasic         +--------+------------------+-----+---------+--------+ +--------+------------------+-----+---------+-------+ Left    Lt Pressure (mmHg)IndexWaveform Comment +--------+------------------+-----+---------+-------+ Brachial150                    triphasic        +--------+------------------+-----+---------+-------+ PTA     171               1.14 triphasic        +--------+------------------+-----+---------+-------+ DP      145               0.97 triphasic        +--------+------------------+-----+---------+-------+ +-------+-----------+-----------+------------+------------+ ABI/TBIToday's ABIToday's TBIPrevious ABIPrevious TBI  +-------+-----------+-----------+------------+------------+ Right  1.05                                           +-------+-----------+-----------+------------+------------+ Left   1.14                                           +-------+-----------+-----------+------------+------------+  Summary: Right: Resting right ankle-brachial index is within normal range. No evidence of significant right lower extremity arterial disease. Left: Resting left ankle-brachial index is within normal range. No evidence  of significant left lower extremity arterial disease.  *See table(s) above for measurements and observations.  Electronically signed by Deitra Mayo MD on 08/22/2018 at 5:27:53 PM.   Final     Lab Data:  CBC: Recent Labs  Lab 08/20/18 2200 08/21/18 6415 08/22/18 0132 08/23/18 0419 08/24/18 0753  WBC 10.6* 9.8 9.1 11.1* 10.2  HGB 11.6* 11.5* 10.8* 11.6* 12.2  HCT 37.2 38.0 35.1* 39.3 40.3  MCV 85.7 87.2 85.6 87.1 86.9  PLT 195 187 135* 165 830   Basic Metabolic Panel: Recent Labs  Lab 08/22/18 0132 08/23/18 0419 08/23/18 1132 08/23/18 1438 08/24/18 0126 08/24/18 0753  NA 134* 136 135 137 140 140  K 5.3* 5.2* 5.0 5.8* 4.6 4.9  CL 108 110 111 113* 112* 117*  CO2 19* 17* 18* 17* 20* 15*  GLUCOSE 430* 260* 387* 240* 161* 160*  BUN 40* 44* 44* 41* 34* 31*  CREATININE 1.76* 1.85* 1.79* 1.63* 1.57* 1.57*  CALCIUM 8.7* 9.0 8.9 8.9 9.1 8.9  MG 1.6* 1.6*  --   --   --  2.3   GFR: Estimated Creatinine Clearance: 32 mL/min (A) (by C-G formula based on SCr of 1.57 mg/dL (H)). Liver Function Tests: Recent Labs  Lab 08/24/18 1000  AST 13*  ALT 14  ALKPHOS 93  BILITOT 0.4  PROT 6.2*  ALBUMIN 2.9*   Cardiac Enzymes: Recent Labs  Lab 08/21/18 0412 08/21/18 1127 08/21/18 1822 08/22/18 0132  TROPONINI <0.03 <0.03 <0.03 <0.03   HbA1C: Recent Labs    08/22/18 0132  HGBA1C 9.3*   CBG: Recent Labs  Lab 08/23/18 1907 08/23/18 2234 08/24/18 0616  08/24/18 0743 08/24/18 1147  GLUCAP 101* 98 158* 153* 270*   Lipid Profile: Recent Labs    08/24/18 0930  CHOL 116  HDL 31*  LDLCALC 51  TRIG 171*  CHOLHDL 3.7   Urine analysis:    Component Value Date/Time   COLORURINE YELLOW 08/24/2018 0856   APPEARANCEUR CLOUDY (A) 08/24/2018 0856   LABSPEC 1.012 08/24/2018 0856   PHURINE 5.0 08/24/2018 0856   GLUCOSEU NEGATIVE 08/24/2018 0856   HGBUR LARGE (A) 08/24/2018 0856   BILIRUBINUR NEGATIVE 08/24/2018 0856   KETONESUR NEGATIVE 08/24/2018 0856   PROTEINUR 100 (A) 08/24/2018 0856   NITRITE NEGATIVE 08/24/2018 0856   LEUKOCYTESUR LARGE (A) 08/24/2018 0856     Vernell Leep, MD, FACP, [facility]. [facility] Pager [phone]  If 7PM-7AM, please contact night-coverage [email or web address] Password [email or web address] 08/24/2018, 5:14 PM

## 2018-08-24 NOTE — Progress Notes (Addendum)
ANTICOAGULATION CONSULT NOTE - Follow Up Consult  Pharmacy Consult for Heparin Indication: chest pain/ACS  No Known Allergies  Patient Measurements: Height: 5\' 1"  (154.9 cm) Weight: 154 lb 12.8 oz (70.2 kg) IBW/kg (Calculated) : 47.8 Heparin Dosing Weight: 62.9 kg  Vital Signs: Temp: 98.2 Boyd (36.8 C) (10/30 0700) Temp Source: Oral (10/30 0700) BP: 184/98 (10/30 0800) Pulse Rate: 69 (10/30 0800)  Labs: Recent Labs    08/21/18 1127 08/21/18 1822  08/22/18 0132 08/23/18 0419  08/23/18 1438 08/24/18 0126 08/24/18 0753  HGB  --   --    < > 10.8* 11.6*  --   --   --  12.2  HCT  --   --   --  35.1* 39.3  --   --   --  40.3  PLT  --   --   --  135* 165  --   --   --  150  HEPARINUNFRC  --   --   --   --   --   --   --   --  0.29*  CREATININE  --   --    < > 1.76* 1.85*   < > 1.63* 1.57* 1.57*  TROPONINI <0.03 <0.03  --  <0.03  --   --   --   --   --    < > = values in this interval not displayed.    Estimated Creatinine Clearance: 32 mL/min (A) (by C-G formula based on SCr of 1.57 mg/dL (H)).   Assessment: Joy Boyd admitted with CP, SOB, and Hx of CAD s/p multiple stents that have restenosed. Initiated heparin while waiting decision regarding CABG v PCI.   HL (0.29) slightly subtherapeutic on Heparin 850 units/hr. CBC stable. No signs of bleeding or infusion issues per nursing. Will increase dose and re-check HL in 6 hours.  Goal of Therapy:  Heparin level 0.3-0.7 units/ml Monitor platelets by anticoagulation protocol: Yes   Plan:  - Increase Heparin to 900 units/hr - Check Anti-Xa level in 6 hours (1600) - Continue to monitor CBC and signs of bleeding  [name] Service, PharmD Candidate 08/24/2018,9:22 AM

## 2018-08-24 NOTE — Progress Notes (Signed)
Pharmacist Heart Failure Core Measure Documentation  Assessment: Joy Boyd has an EF documented as 15-20% on 10/27 by ECHO.  Rationale: Heart failure patients with left ventricular systolic dysfunction (LVSD) and an EF < 40% should be prescribed an angiotensin converting enzyme inhibitor (ACEI) or angiotensin receptor blocker (ARB) at discharge unless a contraindication is documented in the medical record.  This patient is not currently on an ACEI or ARB for HF.  This note is being placed in the record in order to provide documentation that a contraindication to the use of these agents is present for this encounter.  ACE Inhibitor or Angiotensin Receptor Blocker is contraindicated (specify all that apply)  []   ACEI allergy AND ARB allergy []   Angioedema []   Moderate or severe aortic stenosis []   Hyperkalemia []   Hypotension []   Renal artery stenosis [x]   Worsening renal function, preexisting renal disease or dysfunction  *On valsartan PTA - Scr on admission 1.7 (peaked at 1.85 on 10/29), Scr now down to 1.57. Consider restarting agent once renal function stabilizes given reduced EFDoylene Canard, PharmD Clinical Pharmacist  Pager: [phone] Phone: [phone]  08/24/2018 4:03 PM

## 2018-08-24 NOTE — Plan of Care (Signed)
  Problem: Clinical Measurements: Goal: Respiratory complications will improve Outcome: Progressing   Problem: Nutrition: Goal: Adequate nutrition will be maintained Outcome: Progressing   Problem: Coping: Goal: Level of anxiety will decrease Outcome: Progressing   Problem: Elimination: Goal: Will not experience complications related to urinary retention Outcome: Progressing   Problem: Pain Managment: Goal: General experience of comfort will improve Outcome: Progressing   Problem: Safety: Goal: Ability to remain free from injury will improve Outcome: Progressing   Problem: Skin Integrity: Goal: Risk for impaired skin integrity will decrease Outcome: Progressing

## 2018-08-24 NOTE — Progress Notes (Addendum)
Inpatient Diabetes Program Recommendations  AACE/ADA: New Consensus Statement on Inpatient Glycemic Control (2019)  Target Ranges:  Prepandial:   less than 140 mg/dL      Peak postprandial:   less than 180 mg/dL (1-2 hours)      Critically ill patients:  140 - 180 mg/dL  Results for Joy Boyd, Joy Boyd (MRN [id number]) as of 08/24/2018 11:55  Ref. Range 08/23/2018 07:17 08/23/2018 11:21 08/23/2018 18:33 08/23/2018 19:07 08/23/2018 22:34 08/24/2018 06:16 08/24/2018 07:43  Glucose-Capillary Latest Ref Range: 70 - 99 mg/dL 324 (H)  Novolog 17 units @8 :43  NPH 25 units @9 :57 415 (H)  Novolog 12 units @13 :59 48 (L) 101 (H) 98 158 (H) 153 (H)   Results for Joy, Boyd (MRN [id number]) as of 08/24/2018 11:55  Ref. Range 08/22/2018 01:32  Hemoglobin A1C Latest Ref Range: 4.8 - 5.6 % 9.3 (H)   Review of Glycemic Control  Diabetes history: DM2 Outpatient Diabetes medications: 70/30 35 units QAM, 70/30 25 units QPM Current orders for Inpatient glycemic control: 70/30 25 units QAM with breakfast, 70/30 15 units QPM with supper, Novolog 0-9 units TID with meals  Inpatient Diabetes Program Recommendations:  Insulin - Basal: Noted 70/30 has not been given today (reason pt did not eat). If patient is not eating well, would recommend discontinuing 70/30 and using Lantus 28 units Q24H (based on 70 kg x 0.4 units and current 70/30 orders would provide a total of 28 units of basal insulin per day). Correction (SSI): Anticipate hypoglycemia noted on 08/23/18 was due to Novolog insulin (CBG of 415 @ 11:21 am and got Novolog 12 units at 13:50 - 2 hours and 30 mins after CBG obtained). Insulin should be given within 60 mins of when CBG obtained.  Please consider adding Novolog 0-5 units QHS for bedtime correction. HbgA1C: A1C 9.3% on 08/22/18 indicating an average glucose of 220 mg/dl over the past 2-3 months. Patient needs to follow up with PCP regarding improving DM control.  NOTE: Per chart review, patient is not  having consistent meal intake. RN noted 70/30 not given today due to patient not eating. If patient is not eating well, would recommend discontinuing 70/30 and using Lantus Q24H instead.   Addendum 08/24/18@14 :25-Spoke with patient and her husband regarding DM control. Patient's husband states that he understands English fairly well and was able to provide translation.  Patient has had DM since 1987 and has been on insulin since 1998. Patient is taking 70/30 35 units QAM with breakfast and 25 units QPM with supper. Patient's glucose is usually in the 200's and up to 300 mg/dl at times. Patient does not have any issues with hypoglycemia generally but notes low of 48 mg/dl yesterday while inpatient. Inquired about knowledge of an A1C and patient and her husband stated they did not know what an A1C was. Explained what an A1C is and discussed A1C of 9.3% on 08/22/18 (indicating an average glucose of 220 mg/dl. Informed patient A1C concurrent with reported glucose trends.  Discussed glucose and A1C goals. Encouraged patient to work on improving glycemic control and explained that she likely need additional adjustments with insulin to improve DM control and decrease risk of complications. Patient's husband had a question about DM control if patient has CABG. Discussed TCTS protocol with insulin drip and transition to SQ insulin. Explained that following CABG insulin needs are likely to change and insulin will be adjusted as needed. Stressed importance of glycemic control following CABG to decrease risk of complications.  Patient reported she had  no other questions related to DM control and they verbalized understanding of information discussed.  Thanks, Barnie Alderman, RN, MSN, CDE Diabetes Coordinator Inpatient Diabetes Program [phone] (Team Pager from 8am to 5pm)

## 2018-08-25 ENCOUNTER — Inpatient Hospital Stay (HOSPITAL_COMMUNITY): Payer: Medicaid Other

## 2018-08-25 ENCOUNTER — Encounter (HOSPITAL_COMMUNITY)
Admission: EM | Disposition: A | Discharge status: Home / Self Care | Payer: Self-pay | Source: Home / Self Care | Attending: Surgery

## 2018-08-25 DIAGNOSIS — I5043 Acute on chronic combined systolic (congestive) and diastolic (congestive) heart failure: Secondary | ICD-10-CM

## 2018-08-25 DIAGNOSIS — I255 Ischemic cardiomyopathy: Secondary | ICD-10-CM

## 2018-08-25 DIAGNOSIS — I25110 Atherosclerotic heart disease of native coronary artery with unstable angina pectoris: Secondary | ICD-10-CM

## 2018-08-25 HISTORY — PX: RIGHT HEART CATH: CATH118263

## 2018-08-25 LAB — CBC
HCT: 35.5 % — ABNORMAL LOW (ref 36.0–46.0)
HEMOGLOBIN: 10.5 g/dL — AB (ref 12.0–15.0)
MCH: 25.9 pg — ABNORMAL LOW (ref 26.0–34.0)
MCHC: 29.6 g/dL — ABNORMAL LOW (ref 30.0–36.0)
MCV: 87.4 fL (ref 80.0–100.0)
Platelets: 155 10*3/uL (ref 150–400)
RBC: 4.06 MIL/uL (ref 3.87–5.11)
RDW: 14.3 % (ref 11.5–15.5)
WBC: 10.7 10*3/uL — AB (ref 4.0–10.5)
nRBC: 0 % (ref 0.0–0.2)

## 2018-08-25 LAB — BASIC METABOLIC PANEL
ANION GAP: 5 (ref 5–15)
BUN: 29 mg/dL — ABNORMAL HIGH (ref 8–23)
CO2: 18 mmol/L — ABNORMAL LOW (ref 22–32)
Calcium: 8.5 mg/dL — ABNORMAL LOW (ref 8.9–10.3)
Chloride: 112 mmol/L — ABNORMAL HIGH (ref 98–111)
Creatinine, Ser: 1.49 mg/dL — ABNORMAL HIGH (ref 0.44–1.00)
GFR calc Af Amer: 41 mL/min — ABNORMAL LOW (ref >60)
GFR, EST NON AFRICAN AMERICAN: 36 mL/min — AB (ref >60)
Glucose, Bld: 285 mg/dL — ABNORMAL HIGH (ref 70–99)
POTASSIUM: 4.8 mmol/L (ref 3.5–5.1)
SODIUM: 135 mmol/L (ref 135–145)

## 2018-08-25 LAB — GLUCOSE, CAPILLARY
GLUCOSE-CAPILLARY: 271 mg/dL — AB (ref 70–99)
GLUCOSE-CAPILLARY: 193 mg/dL — AB (ref 70–99)
Glucose-Capillary: 388 mg/dL — ABNORMAL HIGH (ref 70–99)
Glucose-Capillary: 227 mg/dL — ABNORMAL HIGH (ref 70–99)
Glucose-Capillary: 223 mg/dL — ABNORMAL HIGH (ref 70–99)
Glucose-Capillary: 179 mg/dL — ABNORMAL HIGH (ref 70–99)

## 2018-08-25 LAB — POCT I-STAT 3, VENOUS BLOOD GAS (G3P V)
Acid-base deficit: 6 mmol/L — ABNORMAL HIGH (ref 0.0–2.0)
Acid-base deficit: 7 mmol/L — ABNORMAL HIGH (ref 0.0–2.0)
BICARBONATE: 18.5 mmol/L — AB (ref 20.0–28.0)
BICARBONATE: 19.3 mmol/L — AB (ref 20.0–28.0)
O2 Saturation: 66 %
O2 Saturation: 67 %
PCO2 VEN: 35.5 mmHg — AB (ref 44.0–60.0)
PCO2 VEN: 36.6 mmHg — AB (ref 44.0–60.0)
PH VEN: 7.324 (ref 7.250–7.430)
PH VEN: 7.33 (ref 7.250–7.430)
PO2 VEN: 36 mmHg (ref 32.0–45.0)
TCO2: 20 mmol/L — AB (ref 22–32)
TCO2: 20 mmol/L — ABNORMAL LOW (ref 22–32)
pO2, Ven: 37 mmHg (ref 32.0–45.0)

## 2018-08-25 LAB — PULMONARY FUNCTION TEST
FEF 25-75 PRE: 1.08 L/s
FEF2575-%Pred-Pre: 59 %
FEV1-%PRED-PRE: 77 %
FEV1-PRE: 1.53 L
FEV1FVC-%Pred-Pre: 92 %
FEV6-Pre: 2.02 L
FVC-%PRED-PRE: 83 %
FVC-PRE: 2.08 L
Pre FEV1/FVC ratio: 74 %
Pre FEV6/FVC Ratio: 97 %

## 2018-08-25 LAB — MRSA PCR SCREENING: MRSA BY PCR: NEGATIVE

## 2018-08-25 LAB — MAGNESIUM: MAGNESIUM: 2.1 mg/dL (ref 1.7–2.4)

## 2018-08-25 LAB — HEPARIN LEVEL (UNFRACTIONATED): HEPARIN UNFRACTIONATED: 0.71 [IU]/mL — AB (ref 0.30–0.70)

## 2018-08-25 SURGERY — RIGHT HEART CATH

## 2018-08-25 MED ORDER — CARVEDILOL 12.5 MG PO TABS
12.5000 mg | ORAL_TABLET | Freq: Two times a day (BID) | ORAL | Status: DC
  Administered 2018-08-25 – 2018-08-26 (×3): 12.5 mg via ORAL
  Filled 2018-08-25 (×3): qty 1

## 2018-08-25 MED ORDER — HEPARIN (PORCINE) IN NACL 1000-0.9 UT/500ML-% IV SOLN
INTRAVENOUS | Status: DC | PRN
  Administered 2018-08-25: 500 mL

## 2018-08-25 MED ORDER — ACETAMINOPHEN 325 MG PO TABS: 650.0000 mg | ORAL_TABLET | ORAL | Status: DC | PRN

## 2018-08-25 MED ORDER — HEPARIN (PORCINE) IN NACL 1000-0.9 UT/500ML-% IV SOLN
INTRAVENOUS | Status: AC
  Filled 2018-08-25: qty 500

## 2018-08-25 MED ORDER — SODIUM CHLORIDE 0.9 % IV SOLN: INTRAVENOUS | Status: DC

## 2018-08-25 MED ORDER — SODIUM CHLORIDE 0.9 % IV SOLN: 250.0000 mL | INTRAVENOUS | Status: DC | PRN

## 2018-08-25 MED ORDER — SODIUM CHLORIDE 0.9% FLUSH: 3.0000 mL | INTRAVENOUS | Status: DC | PRN

## 2018-08-25 MED ORDER — INSULIN GLARGINE 100 UNIT/ML ~~LOC~~ SOLN
28.0000 [IU] | Freq: Every day | SUBCUTANEOUS | Status: DC
  Administered 2018-08-25: 28 [IU] via SUBCUTANEOUS
  Filled 2018-08-25 (×2): qty 0.28

## 2018-08-25 MED ORDER — SODIUM CHLORIDE 0.9% FLUSH: 3.0000 mL | Freq: Two times a day (BID) | INTRAVENOUS | Status: DC

## 2018-08-25 MED ORDER — DIPHENHYDRAMINE-ZINC ACETATE 2-0.1 % EX CREA
TOPICAL_CREAM | Freq: Two times a day (BID) | CUTANEOUS | Status: DC | PRN
  Administered 2018-08-25 – 2018-08-27 (×2): via TOPICAL
  Filled 2018-08-25 (×2): qty 28

## 2018-08-25 MED ORDER — LIDOCAINE HCL (PF) 1 % IJ SOLN
INTRAMUSCULAR | Status: DC | PRN
  Administered 2018-08-25: 2 mL

## 2018-08-25 MED ORDER — HEPARIN (PORCINE) IN NACL 100-0.45 UNIT/ML-% IJ SOLN
950.0000 [IU]/h | INTRAMUSCULAR | Status: DC
  Administered 2018-08-26 (×2): 950 [IU]/h via INTRAVENOUS
  Filled 2018-08-25 (×3): qty 250

## 2018-08-25 MED ORDER — FENTANYL CITRATE (PF) 100 MCG/2ML IJ SOLN
INTRAMUSCULAR | Status: DC | PRN
  Administered 2018-08-25: 25 ug via INTRAVENOUS

## 2018-08-25 MED ORDER — MIDAZOLAM HCL 2 MG/2ML IJ SOLN
INTRAMUSCULAR | Status: AC
  Filled 2018-08-25: qty 2

## 2018-08-25 MED ORDER — ONDANSETRON HCL 4 MG/2ML IJ SOLN: 4.0000 mg | Freq: Four times a day (QID) | INTRAMUSCULAR | Status: DC | PRN

## 2018-08-25 MED ORDER — LIDOCAINE HCL (PF) 1 % IJ SOLN
INTRAMUSCULAR | Status: AC
  Filled 2018-08-25: qty 30

## 2018-08-25 MED ORDER — ISOSORB DINITRATE-HYDRALAZINE 20-37.5 MG PO TABS
1.0000 | ORAL_TABLET | Freq: Three times a day (TID) | ORAL | Status: DC
  Administered 2018-08-25 – 2018-08-26 (×3): 1 via ORAL
  Filled 2018-08-25 (×3): qty 1

## 2018-08-25 MED ORDER — FENTANYL CITRATE (PF) 100 MCG/2ML IJ SOLN
INTRAMUSCULAR | Status: AC
  Filled 2018-08-25: qty 2

## 2018-08-25 MED ORDER — MIDAZOLAM HCL 2 MG/2ML IJ SOLN
INTRAMUSCULAR | Status: DC | PRN
  Administered 2018-08-25: 0.5 mg via INTRAVENOUS

## 2018-08-25 SURGICAL SUPPLY — 9 items
CATH BALLN WEDGE 5F 110CM (CATHETERS) ×3 IMPLANT
ELECT DEFIB PAD ADLT CADENCE (PAD) ×3 IMPLANT
PACK CARDIAC CATHETERIZATION (CUSTOM PROCEDURE TRAY) ×3 IMPLANT
PROTECTION STATION PRESSURIZED (MISCELLANEOUS) ×3
SHEATH GLIDE SLENDER 4/5FR (SHEATH) ×3 IMPLANT
STATION PROTECTION PRESSURIZED (MISCELLANEOUS) ×1 IMPLANT
TRANSDUCER W/STOPCOCK (MISCELLANEOUS) ×3 IMPLANT
TUBING ART PRESS 72  MALE/FEM (TUBING) ×2
TUBING ART PRESS 72 MALE/FEM (TUBING) ×1 IMPLANT

## 2018-08-25 NOTE — Progress Notes (Signed)
ANTICOAGULATION CONSULT NOTE - Follow Up Consult  Pharmacy Consult for Heparin Indication: chest pain/ACS  No Known Allergies  Patient Measurements: Height: 5\' 1"  (154.9 cm) Weight: 154 lb 12.8 oz (70.2 kg) IBW/kg (Calculated) : 47.8 Heparin Dosing Weight: 62.9  Vital Signs: Temp: 99 F (37.2 C) (10/31 0400) Temp Source: Oral (10/31 0400) BP: 156/81 (10/31 0827) Pulse Rate: 95 (10/31 0827)  Labs: Recent Labs    08/23/18 0419  08/24/18 0126 08/24/18 0753 08/24/18 1547 08/25/18 0217  HGB 11.6*  --   --  12.2  --  10.5*  HCT 39.3  --   --  40.3  --  35.5*  PLT 165  --   --  150  --  155  HEPARINUNFRC  --   --   --  0.29* 0.28* 0.71*  CREATININE 1.85*   < > 1.57* 1.57*  --  1.49*   < > = values in this interval not displayed.    Estimated Creatinine Clearance: 33.8 mL/min (A) (by C-G formula based on SCr of 1.49 mg/dL (H)).  Assessment: [name] admitted with CP, SOB, and hx of CAD s/p multiple stents that have restenosed. Initiated heparin while pt decides decision regarding CABG v PCI.  HL (0.71) slightly supratherapeutic on Heparin 1000 units/hr. Hgb (10.5) and HCT (35.5) lower from yesterday, but no signs of bleeding or infusion issues per nursing. Will decrease dose and re-check HL in 6 hours.  Goal of Therapy:  Heparin level 0.3-0.7 units/ml Monitor platelets by anticoagulation protocol: Yes   Plan:  - Decrease Heparin to 950 units/hr - Check anti-Xa level in 6 hours (1700) - Continue to monitor CBC and signs of bleeding  [name] Service, PharmD Candidate 08/25/2018,9:53 AM

## 2018-08-25 NOTE — Progress Notes (Addendum)
Progress Note  Patient Name: Joy Boyd Date of Encounter: 08/25/2018  Primary Cardiologist: Mertie Moores, MD   Subjective  Patient does not speak english. Her son was present and served as Optometrist.  Mrs Stjulien is feeling okay this morning. She endorses some anxiety, relieved by deep breath and belching. She denies chest pain or shortness of breath.  Recommendation to proceed with surgery again discussed with patient and son today. She agrees to [place] today for further work up prior to CABG.  Inpatient Medications    Scheduled Meds: . amLODipine  2.5 mg Oral Daily  . aspirin EC  81 mg Oral Daily  . atorvastatin  80 mg Oral Daily  . azithromycin  250 mg Oral Daily  . bisoprolol  10 mg Oral Daily  . budesonide  0.25 mg Inhalation BID  . guaiFENesin  600 mg Oral BID  . insulin aspart  0-9 Units Subcutaneous TID WC  . insulin glargine  25 Units Subcutaneous QHS  . pantoprazole  40 mg Oral BID AC  . sodium chloride flush  3 mL Intravenous Q12H   Continuous Infusions: . sodium chloride    . cefTRIAXone (ROCEPHIN)  IV Stopped (08/24/18 1848)  . heparin 1,000 Units/hr (08/25/18 0700)   PRN Meds: sodium chloride, alum & mag hydroxide-simeth, guaiFENesin-dextromethorphan, hydrALAZINE, ipratropium-albuterol, nitroGLYCERIN, ondansetron (ZOFRAN) IV, sodium chloride flush   Vital Signs    Vitals:   08/25/18 0400 08/25/18 0500 08/25/18 0600 08/25/18 0700  BP: 140/68 (!) 156/63 129/78 134/76  Pulse: 67 (!) 58 (!) 59 61  Resp: 18 15 16 19   Temp: 99 F (37.2 C)     TempSrc: Oral     SpO2: 97% 95% 96% 93%  Weight:      Height:        Intake/Output Summary (Last 24 hours) at 08/25/2018 0813 Last data filed at 08/25/2018 0700 Gross per 24 hour  Intake 307.65 ml  Output -  Net 307.65 ml   Filed Weights   08/22/18 1600 08/22/18 2133 08/23/18 0621  Weight: 72.6 kg 70.2 kg 70.2 kg    Telemetry    Frequent PVCs, ventricular bigeminy and Trigeminy - Personally  Reviewed  ECG    None today  Physical Exam   GEN: female, in no acute distress.   Neck: No JVD Cardiac: RRR with frequent PVCs, no murmurs, rubs, or gallops.  Respiratory: Clear to auscultation bilaterally. GI: Soft, nontender, non-distended  MS: No edema; No deformity. Neuro:  no focal deficits Psych: Normal affect, endorses anxiety  Labs    Chemistry Recent Labs  Lab 08/24/18 0126 08/24/18 0753 08/24/18 1000 08/25/18 0217  NA 140 140  --  135  K 4.6 4.9  --  4.8  CL 112* 117*  --  112*  CO2 20* 15*  --  18*  GLUCOSE 161* 160*  --  285*  BUN 34* 31*  --  29*  CREATININE 1.57* 1.57*  --  1.49*  CALCIUM 9.1 8.9  --  8.5*  PROT  --   --  6.2*  --   ALBUMIN  --   --  2.9*  --   AST  --   --  13*  --   ALT  --   --  14  --   ALKPHOS  --   --  93  --   BILITOT  --   --  0.4  --   GFRNONAA 34* 34*  --  36*  GFRAA 39* 39*  --  41*  ANIONGAP 8 8  --  5     Hematology Recent Labs  Lab 08/23/18 0419 08/24/18 0753 08/25/18 0217  WBC 11.1* 10.2 10.7*  RBC 4.51 4.64 4.06  HGB 11.6* 12.2 10.5*  HCT 39.3 40.3 35.5*  MCV 87.1 86.9 87.4  MCH 25.7* 26.3 25.9*  MCHC 29.5* 30.3 29.6*  RDW 14.3 14.3 14.3  PLT 165 150 155    Cardiac Enzymes Recent Labs  Lab 08/21/18 0412 08/21/18 1127 08/21/18 1822 08/22/18 0132  TROPONINI <0.03 <0.03 <0.03 <0.03    Recent Labs  Lab 08/20/18 2209  TROPIPOC 0.02     BNP Recent Labs  Lab 08/21/18 0358  BNP 532.8*     DDimer  Recent Labs  Lab 08/21/18 1127  DDIMER 1.07*     Radiology    No results found.  Cardiac Studies   Myoview 08/22/2018:  IMPRESSION: 1. Suspected areas of pharmacologically induced ischemia involving the lateral wall of the left ventricle and septum. 2. No definitive scintigraphic evidence of prior infarction. 3. Moderate left ventricular dilatation. 4. Severe left ventricular global hypokinesia with relative akinesia of the septum. Ejection fraction - 24%  Echocardiogram  08/21/2018:   Study Conclusions - Left ventricle: The cavity size was at the upper limits of   normal. Wall thickness was increased in a pattern of mild LVH.   Systolic function was severely reduced. The estimated ejection   fraction was in the range of 15% to 20%. Diffuse hypokinesis.   Features are consistent with a pseudonormal left ventricular   filling pattern, with concomitant abnormal relaxation and   increased filling pressure (grade 2 diastolic dysfunction). - Mitral valve: There was mild regurgitation. Valve area by   pressure half-time: 1.95 cm^2. - Left atrium: The atrium was moderately dilated. - Right ventricle: Systolic function was mildly reduced. - Right atrium: The atrium was mildly to moderately dilated. - Pulmonary arteries: PA peak pressure: 34 mm Hg (S). - Impressions: There is sevre global LV dysfunction in the setting   of very frequent PVCs. 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  Impressions: - There is sevre global LV dysfunction in the setting of very   frequent PVCs. 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  LHC 08/23/2018:   Prox RCA lesion is 40% stenosed.  Previously placed Prox RCA to Mid RCA stent is 5% stenosed. Mid RCA to Dist RCA stent (unknown type) is widely patent.  Dist RCA lesion is 40% stenosed. Post Atrio lesion is 65% stenosed.  Mid LM to Ost LAD lesion is 85% stenosed with 75% stenosed side branch in Ost Cx.  Previously placed Prox LAD to Mid LAD stent (unknown type) is widely patent. Mid LAD stent is 15% stenosed.  Mid LAD to Dist LAD lesion is 45% stenosed.  Previously placed Prox Cx to Mid Cx stent is 5% stenosed.  LV end diastolic pressure is moderately elevated.   SUMMARY  Severe distal left main disease involving the ostium of both LAD and circumflex.  Mild in-stent restenosis in the LAD, circumflex and RCA stents with moderate disease beyond the stent in the LAD.  Echocardiographically severe Cardiomyopathy with Ejection  Fraction of roughly 20%.    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  Patient Profile     67 y.o. female with PMH of CAD s/p PCI to RCA, LCx, and LAD (most recent one in 01/2018), HTN, uncontrolled T2DM, and HLD who presented with one month of productive cough and 1 week  of atypical chest pain.   Assessment & Plan    1. CAD s/p PCI to RCA, LCx, and LAD (most recent one to RCA in 01/2018) with angina: Ms. Stahle presented with a week of atypical chest pain. Given her cardiac history, stress test was perfoemed showing ischemia, global hypokinesis, and reduced EF. LHC was performed and showed severe LM disease involving the ostium of LAD and LCx; as well as mild in-stent restenosis in all her stents with moderate disease beyond the stent in the LAD. Echo showed EF of 15% with diffuse hypokinesis, G2DD, and reduced RV function. Patient undergoing workup for possible CABG on Monday. CABG discussed again today as preferred option over high-risk PCI; patient tentatively scheduled for surgery on Monday. - Not a candidate for ACEi/ARB/ARNi due to renal insufficiency.  - Appreciate TCTS assistance with this patient. - ASA 81mg  Daily (Plavix held for possible CABG) - Heparin gtt - Change Bisoprolol to Coreg 12.5mg  BID - Change Amlodipine to BiDil  20-37.5mg  TID - Patient to go for [place] cath today  2. 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 New Ischemic changes as above. BNP 532. CXR without pulmonary edema.   Echocardiogram showed EF 15% with global hypokinesis, G2DD, and reduced RV function. This is reduced from 05/2018 when EF was 45%. She appears euvolemic on exam. - Change Bisoprolol to Coreg 12.5mg  BID - Change Amlodipine to BiDil  20-37.5mg  TID  3. HTN: Blood pressure elevated this AM to 160s-190s, likely some component of anxiety.  Not a candidate for  ACEi/ARB/ARNi due to chronic renal insufficiency. - Coreg 12.5mg  BID and BiDil 20-37.5mg  TID as above  4. CKD Stage II/III: No previous data in chart. Baseline appears to be 1.5. Cr ~1.5 this AM. Will continue to monitor.  5. Uncontrolled, insulin-dependent T2DM: On NPH and SSI. Per primary.  6. HLD: Atorvastatin ncreased to 80mg  Daily  7. Dysuria: UA Suspicious for UTI, on Antibiotics per primary.      For questions or updates, please contact [place] Please consult [email or web address] for contact info under      Signed, Neva Seat, MD  08/25/2018, 8:13 AM    I have personally seen and examined this patient. I agree with the assessment and plan as outlined above. She has no chest pain this am. She is awaiting CABG next week. Appreciate Dr. Vivi Martens involvement. I think CABG is her best chance for complete revascularization and long term survival. Plavix is on hold. Will change beta blocker to Coreg. Will stop Norvasc and add Bidil. Right heart cath today to define cardiac output and PA pressures. Pt and her son updated at the bedside.   Lauree Chandler 08/25/2018 10:47 AM

## 2018-08-25 NOTE — Progress Notes (Signed)
Inpatient Diabetes Program Recommendations  AACE/ADA: New Consensus Statement on Inpatient Glycemic Control (2019)  Target Ranges:  Prepandial:   less than 140 mg/dL      Peak postprandial:   less than 180 mg/dL (1-2 hours)      Critically ill patients:  140 - 180 mg/dL   Results for RENLEIGH, OUELLET (MRN [id number]) as of 08/25/2018 11:46  Ref. Range 08/24/2018 07:43 08/24/2018 11:47 08/24/2018 15:39 08/24/2018 22:09 08/25/2018 09:13 08/25/2018 11:28  Glucose-Capillary Latest Ref Range: 70 - 99 mg/dL 153 (H)  Novolog 2 units 270 (H)  Novolog 3 units 388 (H)  Novolog 9 units 319 (H)    Lantus 25 units 271 (H)  Novolog 5 units 227 (H)    Review of Glycemic Control  Diabetes history: DM2 Outpatient Diabetes medications: 70/30 35 units QAM, 70/30 25 units QPM Current orders for Inpatient glycemic control: Lantus 25 units QHS, Novolog 0-9 units TID with meals  Inpatient Diabetes Program Recommendations: Insulin - Basal: Please consider increasing Lantus to 28 units QHS. Correction (SSI): Please consider ordering Novolog 0-5 units QHS for bedtime correction. Insulin - Meal Coverage: Once diet is resumed, please consider ordering Novolog 4 units TID with meals if patient eats at least 50% of meals.  Thanks, Barnie Alderman, RN, MSN, CDE Diabetes Coordinator Inpatient Diabetes Program [phone] (Team Pager from 8am to 5pm)

## 2018-08-25 NOTE — H&P (View-Only) (Signed)
Progress Note  Patient Name: Joy Boyd Date of Encounter: 08/25/2018  Primary Cardiologist: Mertie Moores, MD   Subjective  Patient does not speak english. Her son was present and served as Optometrist.  Joy Boyd is feeling okay this morning. She endorses some anxiety, relieved by deep breath and belching. She denies chest pain or shortness of breath.  Recommendation to proceed with surgery again discussed with patient and son today. She agrees to [place] today for further work up prior to CABG.  Inpatient Medications    Scheduled Meds: . amLODipine  2.5 mg Oral Daily  . aspirin EC  81 mg Oral Daily  . atorvastatin  80 mg Oral Daily  . azithromycin  250 mg Oral Daily  . bisoprolol  10 mg Oral Daily  . budesonide  0.25 mg Inhalation BID  . guaiFENesin  600 mg Oral BID  . insulin aspart  0-9 Units Subcutaneous TID WC  . insulin glargine  25 Units Subcutaneous QHS  . pantoprazole  40 mg Oral BID AC  . sodium chloride flush  3 mL Intravenous Q12H   Continuous Infusions: . sodium chloride    . cefTRIAXone (ROCEPHIN)  IV Stopped (08/24/18 1848)  . heparin 1,000 Units/hr (08/25/18 0700)   PRN Meds: sodium chloride, alum & mag hydroxide-simeth, guaiFENesin-dextromethorphan, hydrALAZINE, ipratropium-albuterol, nitroGLYCERIN, ondansetron (ZOFRAN) IV, sodium chloride flush   Vital Signs    Vitals:   08/25/18 0400 08/25/18 0500 08/25/18 0600 08/25/18 0700  BP: 140/68 (!) 156/63 129/78 134/76  Pulse: 67 (!) 58 (!) 59 61  Resp: 18 15 16 19   Temp: 99 F (37.2 C)     TempSrc: Oral     SpO2: 97% 95% 96% 93%  Weight:      Height:        Intake/Output Summary (Last 24 hours) at 08/25/2018 0813 Last data filed at 08/25/2018 0700 Gross per 24 hour  Intake 307.65 ml  Output -  Net 307.65 ml   Filed Weights   08/22/18 1600 08/22/18 2133 08/23/18 0621  Weight: 72.6 kg 70.2 kg 70.2 kg    Telemetry    Frequent PVCs, ventricular bigeminy and Trigeminy - Personally  Reviewed  ECG    None today  Physical Exam   GEN: female, in no acute distress.   Neck: No JVD Cardiac: RRR with frequent PVCs, no murmurs, rubs, or gallops.  Respiratory: Clear to auscultation bilaterally. GI: Soft, nontender, non-distended  MS: No edema; No deformity. Neuro:  no focal deficits Psych: Normal affect, endorses anxiety  Labs    Chemistry Recent Labs  Lab 08/24/18 0126 08/24/18 0753 08/24/18 1000 08/25/18 0217  NA 140 140  --  135  K 4.6 4.9  --  4.8  CL 112* 117*  --  112*  CO2 20* 15*  --  18*  GLUCOSE 161* 160*  --  285*  BUN 34* 31*  --  29*  CREATININE 1.57* 1.57*  --  1.49*  CALCIUM 9.1 8.9  --  8.5*  PROT  --   --  6.2*  --   ALBUMIN  --   --  2.9*  --   AST  --   --  13*  --   ALT  --   --  14  --   ALKPHOS  --   --  93  --   BILITOT  --   --  0.4  --   GFRNONAA 34* 34*  --  36*  GFRAA 39* 39*  --  41*  ANIONGAP 8 8  --  5     Hematology Recent Labs  Lab 08/23/18 0419 08/24/18 0753 08/25/18 0217  WBC 11.1* 10.2 10.7*  RBC 4.51 4.64 4.06  HGB 11.6* 12.2 10.5*  HCT 39.3 40.3 35.5*  MCV 87.1 86.9 87.4  MCH 25.7* 26.3 25.9*  MCHC 29.5* 30.3 29.6*  RDW 14.3 14.3 14.3  PLT 165 150 155    Cardiac Enzymes Recent Labs  Lab 08/21/18 0412 08/21/18 1127 08/21/18 1822 08/22/18 0132  TROPONINI <0.03 <0.03 <0.03 <0.03    Recent Labs  Lab 08/20/18 2209  TROPIPOC 0.02     BNP Recent Labs  Lab 08/21/18 0358  BNP 532.8*     DDimer  Recent Labs  Lab 08/21/18 1127  DDIMER 1.07*     Radiology    No results found.  Cardiac Studies   Myoview 08/22/2018:  IMPRESSION: 1. Suspected areas of pharmacologically induced ischemia involving the lateral wall of the left ventricle and septum. 2. No definitive scintigraphic evidence of prior infarction. 3. Moderate left ventricular dilatation. 4. Severe left ventricular global hypokinesia with relative akinesia of the septum. Ejection fraction - 24%  Echocardiogram  08/21/2018:   Study Conclusions - Left ventricle: The cavity size was at the upper limits of   normal. Wall thickness was increased in a pattern of mild LVH.   Systolic function was severely reduced. The estimated ejection   fraction was in the range of 15% to 20%. Diffuse hypokinesis.   Features are consistent with a pseudonormal left ventricular   filling pattern, with concomitant abnormal relaxation and   increased filling pressure (grade 2 diastolic dysfunction). - Mitral valve: There was mild regurgitation. Valve area by   pressure half-time: 1.95 cm^2. - Left atrium: The atrium was moderately dilated. - Right ventricle: Systolic function was mildly reduced. - Right atrium: The atrium was mildly to moderately dilated. - Pulmonary arteries: PA peak pressure: 34 mm Hg (S). - Impressions: There is sevre global LV dysfunction in the setting   of very frequent PVCs. 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  Impressions: - There is sevre global LV dysfunction in the setting of very   frequent PVCs. 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  LHC 08/23/2018:   Prox RCA lesion is 40% stenosed.  Previously placed Prox RCA to Mid RCA stent is 5% stenosed. Mid RCA to Dist RCA stent (unknown type) is widely patent.  Dist RCA lesion is 40% stenosed. Post Atrio lesion is 65% stenosed.  Mid LM to Ost LAD lesion is 85% stenosed with 75% stenosed side branch in Ost Cx.  Previously placed Prox LAD to Mid LAD stent (unknown type) is widely patent. Mid LAD stent is 15% stenosed.  Mid LAD to Dist LAD lesion is 45% stenosed.  Previously placed Prox Cx to Mid Cx stent is 5% stenosed.  LV end diastolic pressure is moderately elevated.   SUMMARY  Severe distal left main disease involving the ostium of both LAD and circumflex.  Mild in-stent restenosis in the LAD, circumflex and RCA stents with moderate disease beyond the stent in the LAD.  Echocardiographically severe Cardiomyopathy with Ejection  Fraction of roughly 20%.    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  Patient Profile     67 y.o. female with PMH of CAD s/p PCI to RCA, LCx, and LAD (most recent one in 01/2018), HTN, uncontrolled T2DM, and HLD who presented with one month of productive cough and 1 week  of atypical chest pain.   Assessment & Plan    1. CAD s/p PCI to RCA, LCx, and LAD (most recent one to RCA in 01/2018) with angina: Ms. Beedy presented with a week of atypical chest pain. Given her cardiac history, stress test was perfoemed showing ischemia, global hypokinesis, and reduced EF. LHC was performed and showed severe LM disease involving the ostium of LAD and LCx; as well as mild in-stent restenosis in all her stents with moderate disease beyond the stent in the LAD. Echo showed EF of 15% with diffuse hypokinesis, G2DD, and reduced RV function. Patient undergoing workup for possible CABG on Monday. CABG discussed again today as preferred option over high-risk PCI; patient tentatively scheduled for surgery on Monday. - Not a candidate for ACEi/ARB/ARNi due to renal insufficiency.  - Appreciate TCTS assistance with this patient. - ASA 81mg  Daily (Plavix held for possible CABG) - Heparin gtt - Change Bisoprolol to Coreg 12.5mg  BID - Change Amlodipine to BiDil  20-37.5mg  TID - Patient to go for [place] cath today  2. 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 New Ischemic changes as above. BNP 532. CXR without pulmonary edema.   Echocardiogram showed EF 15% with global hypokinesis, G2DD, and reduced RV function. This is reduced from 05/2018 when EF was 45%. She appears euvolemic on exam. - Change Bisoprolol to Coreg 12.5mg  BID - Change Amlodipine to BiDil  20-37.5mg  TID  3. HTN: Blood pressure elevated this AM to 160s-190s, likely some component of anxiety.  Not a candidate for  ACEi/ARB/ARNi due to chronic renal insufficiency. - Coreg 12.5mg  BID and BiDil 20-37.5mg  TID as above  4. CKD Stage II/III: No previous data in chart. Baseline appears to be 1.5. Cr ~1.5 this AM. Will continue to monitor.  5. Uncontrolled, insulin-dependent T2DM: On NPH and SSI. Per primary.  6. HLD: Atorvastatin ncreased to 80mg  Daily  7. Dysuria: UA Suspicious for UTI, on Antibiotics per primary.      For questions or updates, please contact [place] Please consult [email or web address] for contact info under      Signed, Neva Seat, MD  08/25/2018, 8:13 AM    I have personally seen and examined this patient. I agree with the assessment and plan as outlined above. She has no chest pain this am. She is awaiting CABG next week. Appreciate Dr. Vivi Martens involvement. I think CABG is her best chance for complete revascularization and long term survival. Plavix is on hold. Will change beta blocker to Coreg. Will stop Norvasc and add Bidil. Right heart cath today to define cardiac output and PA pressures. Pt and her son updated at the bedside.   Lauree Chandler 08/25/2018 10:47 AM

## 2018-08-25 NOTE — Consult Note (Signed)
[address]       [place] [address]             [phone]      Cardiothoracic Surgery Consultation  Reason for Consult: High-grade left main and severe multivessel coronary artery disease with severe ischemic cardiomyopathy Referring Physician: Dr. Glenetta Hew  Joy Boyd is an 67 y.o. female.  HPI:   The patient is a 67 year old [name] with a history of poorly controlled diabetes, chronic kidney disease with a creatinine of 1.5-1.7, hypertension, hyperlipidemia, acute on chronic systolic and diastolic congestive heart failure and multivessel coronary disease who underwent multiple stenting procedures in [place].  She reportedly had stenting of the LAD and RCA in 2013.  She apparently had further stents placed in the left circumflex and LAD in April 2019.  She has apparently had 7 stents total.  According to her son who lives in the [place] her left ventricular ejection fraction at that time was 45%.  She was admitted on 08/20/2018 with chest tightness.  She has had increasing shortness of breath and cough for about 1 month.  Troponin levels were all negative on admission.  Letter cardiogram showed sinus rhythm with frequent PVCs.  There is left ventricular hypertrophy with nonspecific ST and T wave changes.  BNP was elevated at 532.8.  2D echocardiogram on 08/21/2018 showed a left ventricular ejection fraction of 15 to 20% with diffuse hypokinesis.  There is grade 2 diastolic dysfunction.  There is mild right ventricular hypokinesis.  There is mild MR.  There is no aortic valve disease.  Left heart catheterization was performed on 08/23/2018.  This showed an 85% distal left main stenosis extending into the ostium of the LAD.  There is about 75% ostial left circumflex stenosis.  There are patent stents in the left circumflex, LAD, and right coronary arteries.  The RCA has about 60% stenosis within the most distal stent.  LVEDP was 20 mmHg.  Right heart  catheterization was not performed for some reason.  The patient has remained free of chest pain and shortness of breath since admission  The patient lives in [place] with her husband but travels frequently to the [place] to visit her son.  She has been here in the past month or so and was planning on returning to [place] about a month.  She does not speak any English but her son who lives here speaks fluent [place] and acts as a Optometrist.  He is with her today.  Past Medical History:  Diagnosis Date  . Acute on chronic combined systolic and diastolic CHF (congestive heart failure) ([facility]) 08/23/2018  . Coronary artery disease   . Diabetes mellitus type 2 in obese ([facility]) 08/23/2018  . Diabetes mellitus without complication ([facility])   . Hypertension   . Pure hypercholesterolemia 08/23/2018  . PVC (premature ventricular contraction) 08/23/2018    Past Surgical History:  Procedure Laterality Date  . LEFT HEART CATH AND CORONARY ANGIOGRAPHY N/A 08/23/2018   Procedure: LEFT HEART CATH AND CORONARY ANGIOGRAPHY;  Surgeon: Leonie Man, MD;  Location: [facility];  Service: Cardiovascular;  Laterality: N/A;    Family History  Problem Relation Age of Onset  . CAD Father   . Hypertension Father     Social History:  reports that she has never smoked. She has never used smokeless tobacco. She reports that she does not drink alcohol or use drugs.  Allergies: No Known Allergies  Medications:  I have reviewed the patient's current medications. Prior to Admission:  Medications Prior to Admission  Medication Sig Dispense Refill Last Dose  . aspirin 325 MG tablet Take 150 mg by mouth 3 (three) times a week.    08/20/2018 at Unknown time  . atorvastatin (LIPITOR) 20 MG tablet Take 20 mg by mouth daily at 6 PM.    08/20/2018 at Unknown time  . bisoprolol (ZEBETA) 5 MG tablet Take 5 mg by mouth daily.   08/20/2018 at Unknown time  . clopidogrel (PLAVIX) 75 MG tablet Take 75 mg by  mouth daily.   08/20/2018 at am  . insulin NPH-regular Human (NOVOLIN 70/30) (70-30) 100 UNIT/ML injection Inject 25-35 Units into the skin See admin instructions. Inject 35 units am, 25 units evening.   08/20/2018 at Unknown time  . nitroGLYCERIN (NITROSTAT) 0.4 MG SL tablet Place 2.6 mg under the tongue at bedtime. **Nitromint**   08/20/2018 at Unknown time  . valsartan-hydrochlorothiazide (DIOVAN-HCT) 160-12.5 MG tablet Take 1 tablet by mouth daily.   08/20/2018 at Unknown time   Scheduled: . amLODipine  2.5 mg Oral Daily  . aspirin EC  81 mg Oral Daily  . atorvastatin  80 mg Oral Daily  . azithromycin  250 mg Oral Daily  . bisoprolol  10 mg Oral Daily  . budesonide  0.25 mg Inhalation BID  . guaiFENesin  600 mg Oral BID  . insulin aspart  0-9 Units Subcutaneous TID WC  . insulin glargine  25 Units Subcutaneous QHS  . pantoprazole  40 mg Oral BID AC  . sodium chloride flush  3 mL Intravenous Q12H   Continuous: . sodium chloride    . cefTRIAXone (ROCEPHIN)  IV Stopped (08/24/18 1848)  . heparin 1,000 Units/hr (08/25/18 0400)   ONG:EXBMWU chloride, alum & mag hydroxide-simeth, guaiFENesin-dextromethorphan, hydrALAZINE, ipratropium-albuterol, nitroGLYCERIN, ondansetron (ZOFRAN) IV, sodium chloride flush Anti-infectives (From admission, onward)   Start     Dose/Rate Route Frequency Ordered Stop   08/24/18 1730  cefTRIAXone (ROCEPHIN) 1 g in sodium chloride 0.9 % 100 mL IVPB     1 g 200 mL/hr over 30 Minutes Intravenous Every 24 hours 08/24/18 1717     08/22/18 1000  azithromycin (ZITHROMAX) tablet 250 mg     250 mg Oral Daily 08/21/18 0405 08/26/18 0959   08/21/18 0500  azithromycin (ZITHROMAX) tablet 500 mg     500 mg Oral Daily 08/21/18 0405 08/21/18 0606      Results for orders placed or performed during the hospital encounter of 08/20/18 (from the past 48 hour(s))  Glucose, capillary     Status: Abnormal   Collection Time: 08/23/18  7:17 AM  Result Value Ref Range    Glucose-Capillary 324 (H) 70 - 99 mg/dL  Glucose, capillary     Status: Abnormal   Collection Time: 08/23/18 11:21 AM  Result Value Ref Range   Glucose-Capillary 415 (H) 70 - 99 mg/dL  Basic metabolic panel     Status: Abnormal   Collection Time: 08/23/18 11:32 AM  Result Value Ref Range   Sodium 135 135 - 145 mmol/L   Potassium 5.0 3.5 - 5.1 mmol/L   Chloride 111 98 - 111 mmol/L   CO2 18 (L) 22 - 32 mmol/L   Glucose, Bld 387 (H) 70 - 99 mg/dL   BUN 44 (H) 8 - 23 mg/dL   Creatinine, Ser 1.79 (H) 0.44 - 1.00 mg/dL   Calcium 8.9 8.9 - 10.3 mg/dL   GFR calc non Af Amer 29 (  L) >60 mL/min   GFR calc Af Amer 33 (L) >60 mL/min    Comment: (NOTE) The eGFR has been calculated using the CKD EPI equation. This calculation has not been validated in all clinical situations. eGFR's persistently <60 mL/min signify possible Chronic Kidney Disease.    Anion gap 6 5 - 15    Comment: Performed at [facility] [address], [place], [place] [address]  Basic metabolic panel     Status: Abnormal   Collection Time: 08/23/18  2:38 PM  Result Value Ref Range   Sodium 137 135 - 145 mmol/L   Potassium 5.8 (H) 3.5 - 5.1 mmol/L    Comment: SPECIMEN HEMOLYZED. HEMOLYSIS MAY AFFECT INTEGRITY OF RESULTS.   Chloride 113 (H) 98 - 111 mmol/L   CO2 17 (L) 22 - 32 mmol/L   Glucose, Bld 240 (H) 70 - 99 mg/dL   BUN 41 (H) 8 - 23 mg/dL   Creatinine, Ser 1.63 (H) 0.44 - 1.00 mg/dL   Calcium 8.9 8.9 - 10.3 mg/dL   GFR calc non Af Amer 32 (L) >60 mL/min   GFR calc Af Amer 37 (L) >60 mL/min    Comment: (NOTE) The eGFR has been calculated using the CKD EPI equation. This calculation has not been validated in all clinical situations. eGFR's persistently <60 mL/min signify possible Chronic Kidney Disease.    Anion gap 7 5 - 15    Comment: Performed at [facility] [address], [place], [place] [address]  Glucose, capillary     Status: Abnormal   Collection Time: 08/23/18  6:33 PM  Result Value  Ref Range   Glucose-Capillary 48 (L) 70 - 99 mg/dL   Comment 1 Capillary Specimen   Glucose, capillary     Status: Abnormal   Collection Time: 08/23/18  7:07 PM  Result Value Ref Range   Glucose-Capillary 101 (H) 70 - 99 mg/dL   Comment 1 Capillary Specimen   Glucose, capillary     Status: None   Collection Time: 08/23/18 10:34 PM  Result Value Ref Range   Glucose-Capillary 98 70 - 99 mg/dL   Comment 1 Capillary Specimen   Basic metabolic panel     Status: Abnormal   Collection Time: 08/24/18  1:26 AM  Result Value Ref Range   Sodium 140 135 - 145 mmol/L   Potassium 4.6 3.5 - 5.1 mmol/L   Chloride 112 (H) 98 - 111 mmol/L   CO2 20 (L) 22 - 32 mmol/L   Glucose, Bld 161 (H) 70 - 99 mg/dL   BUN 34 (H) 8 - 23 mg/dL   Creatinine, Ser 1.57 (H) 0.44 - 1.00 mg/dL   Calcium 9.1 8.9 - 10.3 mg/dL   GFR calc non Af Amer 34 (L) >60 mL/min   GFR calc Af Amer 39 (L) >60 mL/min    Comment: (NOTE) The eGFR has been calculated using the CKD EPI equation. This calculation has not been validated in all clinical situations. eGFR's persistently <60 mL/min signify possible Chronic Kidney Disease.    Anion gap 8 5 - 15    Comment: Performed at [facility] [address], [place], [place] [address]  Glucose, capillary     Status: Abnormal   Collection Time: 08/24/18  6:16 AM  Result Value Ref Range   Glucose-Capillary 158 (H) 70 - 99 mg/dL   Comment 1 Capillary Specimen   Glucose, capillary     Status: Abnormal   Collection Time:  08/24/18  7:43 AM  Result Value Ref Range   Glucose-Capillary 153 (H) 70 - 99 mg/dL   Comment 1 Capillary Specimen   Basic metabolic panel     Status: Abnormal   Collection Time: 08/24/18  7:53 AM  Result Value Ref Range   Sodium 140 135 - 145 mmol/L   Potassium 4.9 3.5 - 5.1 mmol/L   Chloride 117 (H) 98 - 111 mmol/L   CO2 15 (L) 22 - 32 mmol/L   Glucose, Bld 160 (H) 70 - 99 mg/dL   BUN 31 (H) 8 - 23 mg/dL   Creatinine, Ser 1.57 (H) 0.44 - 1.00 mg/dL    Calcium 8.9 8.9 - 10.3 mg/dL   GFR calc non Af Amer 34 (L) >60 mL/min   GFR calc Af Amer 39 (L) >60 mL/min    Comment: (NOTE) The eGFR has been calculated using the CKD EPI equation. This calculation has not been validated in all clinical situations. eGFR's persistently <60 mL/min signify possible Chronic Kidney Disease.    Anion gap 8 5 - 15    Comment: Performed at [facility] [address], [place], [place] [address]  CBC     Status: None   Collection Time: 08/24/18  7:53 AM  Result Value Ref Range   WBC 10.2 4.0 - 10.5 K/uL   RBC 4.64 3.87 - 5.11 MIL/uL   Hemoglobin 12.2 12.0 - 15.0 g/dL   HCT 40.3 36.0 - 46.0 %   MCV 86.9 80.0 - 100.0 fL   MCH 26.3 26.0 - 34.0 pg   MCHC 30.3 30.0 - 36.0 g/dL   RDW 14.3 11.5 - 15.5 %   Platelets 150 150 - 400 K/uL   nRBC 0.0 0.0 - 0.2 %    Comment: Performed at [facility], [place] [address] [place], [place] [address]  Magnesium     Status: None   Collection Time: 08/24/18  7:53 AM  Result Value Ref Range   Magnesium 2.3 1.7 - 2.4 mg/dL    Comment: Performed at [facility] [address] [place], [place] [address]  Heparin level (unfractionated)     Status: Abnormal   Collection Time: 08/24/18  7:53 AM  Result Value Ref Range   Heparin Unfractionated 0.29 (L) 0.30 - 0.70 IU/mL    Comment: (NOTE) If heparin results are below expected values, and patient dosage has  been confirmed, suggest follow up testing of antithrombin III levels. Performed at [facility], [place] [address], [place], [place] [address]   Urinalysis, Routine w reflex microscopic     Status: Abnormal   Collection Time: 08/24/18  8:56 AM  Result Value Ref Range   Color, Urine YELLOW YELLOW   APPearance CLOUDY (A) CLEAR   Specific Gravity, Urine 1.012 1.005 - 1.030   pH 5.0 5.0 - 8.0   Glucose, UA NEGATIVE NEGATIVE mg/dL   Hgb urine dipstick LARGE (A) NEGATIVE   Bilirubin Urine NEGATIVE NEGATIVE   Ketones, ur NEGATIVE NEGATIVE mg/dL    Protein, ur 100 (A) NEGATIVE mg/dL   Nitrite NEGATIVE NEGATIVE   Leukocytes, UA LARGE (A) NEGATIVE   RBC / HPF >50 (H) 0 - 5 RBC/hpf   WBC, UA >50 (H) 0 - 5 WBC/hpf   Bacteria, UA MANY (A) NONE SEEN   Squamous Epithelial / LPF 6-10 0 - 5   Mucus PRESENT     Comment: Performed at [facility], [place],  [place] [address]  Lipid panel     Status: Abnormal   Collection Time: 08/24/18  9:30 AM  Result Value Ref Range   Cholesterol 116 0 - 200 mg/dL   Triglycerides 171 (H) <150 mg/dL   HDL 31 (L) >40 mg/dL   Total CHOL/HDL Ratio 3.7 RATIO   VLDL 34 0 - 40 mg/dL   LDL Cholesterol 51 0 - 99 mg/dL    Comment:        Total Cholesterol/HDL:CHD Risk Coronary Heart Disease Risk Table                     Men   Women  1/2 Average Risk   3.4   3.3  Average Risk       5.0   4.4  2 X Average Risk   9.6   7.1  3 X Average Risk  23.4   11.0        Use the calculated Patient Ratio above and the CHD Risk Table to determine the patient's CHD Risk.        ATP III CLASSIFICATION (LDL):  <100     mg/dL   Optimal  100-129  mg/dL   Near or Above                    Optimal  130-159  mg/dL   Borderline  160-189  mg/dL   High  >190     mg/dL   Very High Performed at [facility] [address], [place], [place] [address]   Hepatic function panel     Status: Abnormal   Collection Time: 08/24/18 10:00 AM  Result Value Ref Range   Total Protein 6.2 (L) 6.5 - 8.1 g/dL   Albumin 2.9 (L) 3.5 - 5.0 g/dL   AST 13 (L) 15 - 41 U/L   ALT 14 0 - 44 U/L   Alkaline Phosphatase 93 38 - 126 U/L   Total Bilirubin 0.4 0.3 - 1.2 mg/dL   Bilirubin, Direct <0.1 0.0 - 0.2 mg/dL   Indirect Bilirubin NOT CALCULATED 0.3 - 0.9 mg/dL    Comment: Performed at [facility] [address], [place], [place] [address]  Glucose, capillary     Status: Abnormal   Collection Time: 08/24/18 11:47 AM  Result Value Ref Range   Glucose-Capillary 270 (H) 70 - 99 mg/dL   Comment 1 Capillary  Specimen   Heparin level (unfractionated)     Status: Abnormal   Collection Time: 08/24/18  3:47 PM  Result Value Ref Range   Heparin Unfractionated 0.28 (L) 0.30 - 0.70 IU/mL    Comment: (NOTE) If heparin results are below expected values, and patient dosage has  been confirmed, suggest follow up testing of antithrombin III levels. Performed at [facility], [place] [address], [place], [place] [address]   Glucose, capillary     Status: Abnormal   Collection Time: 08/24/18 10:09 PM  Result Value Ref Range   Glucose-Capillary 319 (H) 70 - 99 mg/dL   Comment 1 Capillary Specimen   Basic metabolic panel     Status: Abnormal   Collection Time: 08/25/18  2:17 AM  Result Value Ref Range   Sodium 135 135 - 145 mmol/L   Potassium 4.8 3.5 - 5.1 mmol/L   Chloride 112 (H) 98 - 111 mmol/L   CO2 18 (L) 22 - 32 mmol/L   Glucose, Bld 285 (H) 70 - 99 mg/dL  BUN 29 (H) 8 - 23 mg/dL   Creatinine, Ser 1.49 (H) 0.44 - 1.00 mg/dL   Calcium 8.5 (L) 8.9 - 10.3 mg/dL   GFR calc non Af Amer 36 (L) >60 mL/min   GFR calc Af Amer 41 (L) >60 mL/min    Comment: (NOTE) The eGFR has been calculated using the CKD EPI equation. This calculation has not been validated in all clinical situations. eGFR's persistently <60 mL/min signify possible Chronic Kidney Disease.    Anion gap 5 5 - 15    Comment: Performed at [facility] [address], [place], [place] [address]  CBC     Status: Abnormal   Collection Time: 08/25/18  2:17 AM  Result Value Ref Range   WBC 10.7 (H) 4.0 - 10.5 K/uL   RBC 4.06 3.87 - 5.11 MIL/uL   Hemoglobin 10.5 (L) 12.0 - 15.0 g/dL   HCT 35.5 (L) 36.0 - 46.0 %   MCV 87.4 80.0 - 100.0 fL   MCH 25.9 (L) 26.0 - 34.0 pg   MCHC 29.6 (L) 30.0 - 36.0 g/dL   RDW 14.3 11.5 - 15.5 %   Platelets 155 150 - 400 K/uL   nRBC 0.0 0.0 - 0.2 %    Comment: Performed at [facility], [place] [address], [place], [place] [address]  Magnesium     Status: None   Collection Time: 08/25/18   2:17 AM  Result Value Ref Range   Magnesium 2.1 1.7 - 2.4 mg/dL    Comment: Performed at [facility] [address], [place], [place] [address]  Heparin level (unfractionated)     Status: Abnormal   Collection Time: 08/25/18  2:17 AM  Result Value Ref Range   Heparin Unfractionated 0.71 (H) 0.30 - 0.70 IU/mL    Comment: (NOTE) If heparin results are below expected values, and patient dosage has  been confirmed, suggest follow up testing of antithrombin III levels. Performed at [facility], [place] [address], [place], [place] [address]     No results found.  Review of Systems  Constitutional: Positive for malaise/fatigue.  HENT: Negative.   Eyes: Negative.   Respiratory: Positive for cough, sputum production and shortness of breath.   Cardiovascular: Positive for chest pain, palpitations and orthopnea. Negative for leg swelling.  Gastrointestinal: Negative.   Genitourinary: Positive for dysuria.  Musculoskeletal: Negative.   Skin: Negative.   Neurological: Negative.   Endo/Heme/Allergies: Negative.   Psychiatric/Behavioral: Negative.    Blood pressure 140/68, pulse 67, temperature 99 F (37.2 C), temperature source Oral, resp. rate 18, height '5\' 1"'  (1.549 m), weight 70.2 kg, SpO2 97 %. Physical Exam  Constitutional: She is oriented to person, place, and time. She appears well-developed and well-nourished. No distress.  HENT:  Head: Normocephalic and atraumatic.  Mouth/Throat: Oropharynx is clear and moist.  Eyes: Pupils are equal, round, and reactive to light. EOM are normal.  Neck: Normal range of motion. Neck supple. No JVD present. No thyromegaly present.  Cardiovascular: Normal rate, regular rhythm, normal heart sounds and intact distal pulses.  No murmur heard. Respiratory: Effort normal and breath sounds normal. No respiratory distress. She has no wheezes. She has no rales.  GI: Soft. Bowel sounds are normal. She exhibits no distension. There is no  tenderness.  Musculoskeletal: Normal range of motion. She exhibits no edema.  Lymphadenopathy:    She has no cervical adenopathy.  Neurological: She is alert and oriented to person, place, and time.  Skin: Skin is warm and dry.  Psychiatric: She has a normal mood and affect.             *[facility]*                         [place], [place] [address]                            [phone]  ------------------------------------------------------------------- Transthoracic Echocardiography  Patient:    Suzane, Vanderweide MR #:       [id number] Study Date: 08/21/2018 Gender:     F Age:        31 Height:     154.9 cm Weight:     72.7 kg BSA:        1.8 m^2 Pt. Status: Room:       5C02C   ATTENDING    Rai, Ripudeep K  PERFORMING   [facility], Inpatient  SONOGRAPHER  Cardell Peach, RDCS  ORDERING     Park, Derenda Mis  [place]  cc:  ------------------------------------------------------------------- LV EF: 15% -   20%  ------------------------------------------------------------------- Indications:      Chest pain 786.51.  ------------------------------------------------------------------- History:   PMH:   Coronary artery disease.  Risk factors: Hypertension. Diabetes mellitus.  ------------------------------------------------------------------- Study Conclusions  - Left ventricle: The cavity size was at the upper limits of   normal. Wall thickness was increased in a pattern of mild LVH.   Systolic function was severely reduced. The estimated ejection   fraction was in the range of 15% to 20%. Diffuse hypokinesis.   Features are consistent with a pseudonormal left ventricular   filling pattern, with concomitant abnormal relaxation and   increased filling pressure (grade 2 diastolic dysfunction). - Mitral valve: There was mild regurgitation. Valve area by   pressure  half-time: 1.95 cm^2. - Left atrium: The atrium was moderately dilated. - Right ventricle: Systolic function was mildly reduced. - Right atrium: The atrium was mildly to moderately dilated. - Pulmonary arteries: PA peak pressure: 34 mm Hg (S). - Impressions: There is sevre global LV dysfunction in the setting   of very frequent PVCs. 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  Impressions:  - There is sevre global LV dysfunction in the setting of very   frequent PVCs. 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  ------------------------------------------------------------------- Study data:  No prior study was available for comparison.  Study status:  Routine.  Procedure:  Transthoracic echocardiography. Image quality was adequate.          Transthoracic echocardiography.  M-mode, complete 2D, spectral Doppler, and color Doppler.  Birthdate:  Patient birthdate: 1951/04/19.  Age:  Patient is 67 yr old.  Sex:  Gender: female.    BMI: 30.3 kg/m^2.  Blood pressure:     174/107  Patient status:  Inpatient.  Study date: Study date: 08/21/2018. Study time: 09:21 AM.  Location:  Emergency department.  -------------------------------------------------------------------  ------------------------------------------------------------------- Left ventricle:  The cavity size was at the upper limits of normal. Wall thickness was increased in a pattern of mild LVH. Systolic function was severely reduced. The estimated ejection fraction was in the range of 15% to  20%. Diffuse hypokinesis. Features are consistent with a pseudonormal left ventricular filling pattern, with concomitant abnormal relaxation and increased filling pressure (grade 2 diastolic dysfunction).  ------------------------------------------------------------------- Aortic valve:   Structurally normal valve.   Cusp separation was normal.  Doppler:  Transvalvular velocity was within the normal range. There was no stenosis. There was no  regurgitation.  ------------------------------------------------------------------- Mitral valve:   Structurally normal valve.   Leaflet separation was normal.  Doppler:  Transvalvular velocity was within the normal range. There was no evidence for stenosis. There was mild regurgitation.    Valve area by pressure half-time: 1.95 cm^2. Indexed valve area by pressure half-time: 1.09 cm^2/m^2.    Peak gradient (D): 3 mm Hg.  ------------------------------------------------------------------- Left atrium:  The atrium was moderately dilated.  ------------------------------------------------------------------- Right ventricle:  The cavity size was normal. Wall thickness was normal. Systolic function was mildly reduced.  ------------------------------------------------------------------- Pulmonic valve:    The valve appears to be grossly normal. Doppler:  There was mild regurgitation.  ------------------------------------------------------------------- Tricuspid valve:   Structurally normal valve.   Leaflet separation was normal.  Doppler:  Transvalvular velocity was within the normal range. There was mild regurgitation.  ------------------------------------------------------------------- Right atrium:  The atrium was mildly to moderately dilated.  ------------------------------------------------------------------- Pericardium:  There was no pericardial effusion.  ------------------------------------------------------------------- Systemic veins: Inferior vena cava: The vessel was normal in size. The respirophasic diameter changes were in the normal range (>= 50%), consistent with normal central venous pressure.  ------------------------------------------------------------------- Measurements   Left ventricle                          Value          Reference  LV ID, ED, PLAX chordal                 51    mm       43 - 52  LV ID, ES, PLAX chordal         (H)     45    mm        23 - 38  LV fx shortening, PLAX chordal  (L)     12    %        >=29  LV PW thickness, ED                     11    mm       ----------  IVS/LV PW ratio, ED                     1.09           <=1.3  Stroke volume, 2D                       37    ml       ----------  Stroke volume/bsa, 2D                   21    ml/m^2   ----------  LV e&', lateral                          4.38  cm/s     ----------  LV E/e&', lateral                        20.68          ----------  LV e&', medial                           3.99  cm/s     ----------  LV E/e&', medial                         22.71          ----------  LV e&', average                          4.19  cm/s     ----------  LV E/e&', average                        21.65          ----------    Ventricular septum                      Value          Reference  IVS thickness, ED                       12    mm       ----------    LVOT                                    Value          Reference  LVOT ID, S                              19    mm       ----------  LVOT area                               2.84  cm^2     ----------  LVOT peak velocity, S                   69.8  cm/s     ----------  LVOT mean velocity, S                   40.6  cm/s     ----------  LVOT VTI, S                             13.2  cm       ----------  LVOT peak gradient, S                   2     mm Hg    ----------    Aorta                                   Value          Reference  Aortic root ID, ED                      26    mm       ----------    Left atrium  Value          Reference  LA ID, A-P, ES                          47    mm       ----------  LA ID/bsa, A-P                  (H)     2.62  cm/m^2   <=2.2  LA volume, S                            41.1  ml       ----------  LA volume/bsa, S                        22.9  ml/m^2   ----------  LA volume, ES, 1-p A4C                  44.1  ml       ----------  LA volume/bsa, ES, 1-p A4C               24.6  ml/m^2   ----------  LA volume, ES, 1-p A2C                  33.5  ml       ----------  LA volume/bsa, ES, 1-p A2C              18.6  ml/m^2   ----------    Mitral valve                            Value          Reference  Mitral E-wave peak velocity             90.6  cm/s     ----------  Mitral A-wave peak velocity             75.9  cm/s     ----------  Mitral deceleration time        (H)     243   ms       150 - 230  Mitral pressure half-time               71    ms       ----------  Mitral peak gradient, D                 3     mm Hg    ----------  Mitral E/A ratio, peak                  1.2            ----------  Mitral valve area, PHT, DP              1.95  cm^2     ----------  Mitral valve area/bsa, PHT, DP          1.09  cm^2/m^2 ----------    Pulmonary arteries                      Value          Reference  PA pressure, S, DP              (H)  34    mm Hg    <=30    Tricuspid valve                         Value          Reference  Tricuspid regurg peak velocity          278   cm/s     ----------  Tricuspid peak RV-RA gradient           31    mm Hg    ----------    Right atrium                            Value          Reference  RA ID, S-I, ES, A4C                     44.9  mm       34 - 49  RA area, ES, A4C                        12.3  cm^2     8.3 - 19.5  RA volume, ES, A/L                      27    ml       ----------  RA volume/bsa, ES, A/L                  15    ml/m^2   ----------    Systemic veins                          Value          Reference  Estimated CVP                           3     mm Hg    ----------    Right ventricle                         Value          Reference  RV ID, minor axis, ED, A4C base         31    mm       ----------  TAPSE                                   20.8  mm       ----------  RV pressure, S, DP              (H)     34    mm Hg    <=30  RV s&', lateral, S                       10.5  cm/s      ----------  Legend: (L)  and  (H)  mark values outside specified reference range.  ------------------------------------------------------------------- Prepared and Electronically Authenticated by  Pierre Bali, MD 2019-10-27T12:20:06    Physicians   Panel Physicians Referring Physician Case Authorizing Physician  Leonie Man, MD (Primary)    Procedures  LEFT HEART CATH AND CORONARY ANGIOGRAPHY  Conclusion     Prox RCA lesion is 40% stenosed.  Previously placed Prox RCA to Mid RCA stent is 5% stenosed. Mid RCA to Dist RCA stent (unknown type) is widely patent.  Dist RCA lesion is 40% stenosed. Post Atrio lesion is 65% stenosed.  Mid LM to Ost LAD lesion is 85% stenosed with 75% stenosed side branch in Ost Cx.  Previously placed Prox LAD to Mid LAD stent (unknown type) is widely patent. Mid LAD stent is 15% stenosed.  Mid LAD to Dist LAD lesion is 45% stenosed.  Previously placed Prox Cx to Mid Cx stent is 5% stenosed.  LV end diastolic pressure is moderately elevated.   SUMMARY  Severe distal left main disease involving the ostium of both LAD and circumflex.  Mild in-stent restenosis in the LAD, circumflex and RCA stents with moderate disease beyond the stent in the LAD.  Echocardiographically severe Cardiomyopathy with Ejection Fraction of roughly 20%.    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   RECOMMENDATIONS  The patient will be transferred to 2 H CCU for closer monitoring.  Since she remains hemodynamically stable and not having active angina, I chose not to place balloon pump.  Restart heparin 8 hours after sheath removal.  Have consult to CVTS (Dr. Cyndia Bent) to weigh in on possible surgical options.  I will also change her rounding MD to Dr. Angelena Form to allow for interventional input. ? As discussed with the family: Options are as follows: 1. Medical management which  would lead to worsening symptoms and heart failure 2. CABG -Dr. Cyndia Bent voices concerns given reduced EF that this may not provide enough benefit for the risks 3. UNPROTECTED LEFT MAIN PCI -which would likely require Impella support given reduced EF.  (Will need to review with interventional colleagues to see if this is a feasible option.   For now, we will discontinue Plavix and continue aspirin.  Otherwise continue aggressive cardiac risk factor modification.  Recommend Aspirin 82m daily for severe CAD -Plavix currently on hold pending CT surgical evaluation, if not deemed to be surgical option, would restart/reload Plavix..Glenetta Hew M.D., M.S. Interventional Cardiologist   Pager # [phone] # [phone] [address] [place] [id number]   Procedural Details/Technique   Technical Details Primary Cardiologist: No primary care provider on file. [place] Referring Cardiologist: Dr. ROval Linsey Ms. [place] 46[place] with CAD s/p multiple PCIs (presumably most recently in [place] 89[place]), hypertension, diabetes, and CKD 3 here with unstable angina. She was found to have severely reduced EF by echocardiogram if 10 to 20% and an abnormal Myoview stress test. She is therefore referred for catheterization with possible PCI.\\Time Out: Verified patient identification, verified procedure, site/side was marked, verified correct patient position, special equipment/implants available, medications/allergies/relevent history reviewed, required imaging and test results available. Performed. Consent Signed.   Access:  * RIGHG Radial Artery & Ulnar arteries were prepped and draped. Lidocaine was administered, and direct ultrasound guidance was used however I was not able to adequately access either artery for sheath placement.  Therefore radial/ulnar access was banded and converted to femoral access.  *Right Common Femoral Artery: 5 Fr Sheath - fluoroscopically guided modified Seldinger Technique   Left Heart  Catheterization: 5 Fr Catheters advanced or exchanged over a J-wire under direct fluoroscopic guidance into the ascending aorta; JR4 catheter advanced first.  * Left Coronary Artery Cineangiography: JL4 catheter  * Right Coronary Artery Cineangiography: JR4 catheter  * LV Hemodynamics (no LV Gram in order to conserve contrast.): JR4 catheter  - After completion of angiography, the catheter was removed completely out of the body over wire without complication.  Femoral sheath(s) removed in the Cath Lab with Mynx closure device used for hemostasis --1755 hrs.   MEDICATIONS * SQ Lidocaine 30m for radial and ulnar access, additional 20 mL for femoral * Isovue Contrast: 80 mL  Fluoro time: 3 minutes. Dose Area Product: [id number]. Cumulative Air Kerma: 133 mGy.   Estimated blood loss <50 mL.  During this procedure the patient was administered the following to achieve and maintain moderate conscious sedation: Versed 3 mg, Fentanyl 50 mcg, while the patient's heart rate, blood pressure, and oxygen saturation were continuously monitored. The period of conscious sedation was 61 minutes, of which I was present face-to-face 100% of this time.  Coronary Findings   Diagnostic  Dominance: Right  Left Main  Vessel is moderate in size.  Mid LM to Ost LAD lesion 85% stenosed with side branch in Ost Cx 75% stenosed  Mid LM to Ost LAD lesion is 85% stenosed with 75% stenosed side branch in Ost Cx. The lesion is located at the bifurcation and concentric.  Left Anterior Descending  Prox LAD to Mid LAD lesion 0% stenosed  Previously placed Prox LAD to Mid LAD stent (unknown type) is widely patent.  Mid LAD lesion 15% stenosed  Mid LAD lesion is 15% stenosed. The lesion was previously treated using a stent (unknown type)  at an unknown date. Previously placed stent displays restenosis.  Mid LAD to Dist LAD lesion 45% stenosed  Mid LAD to Dist LAD lesion is 45% stenosed. The lesion is tubular and eccentric.  First Diagonal Branch  Vessel is small in size.  Third Diagonal Branch  Vessel is small in size.  Left Circumflex  Prox Cx to Mid Cx lesion 5% stenosed  Prox Cx to Mid Cx lesion is 5% stenosed. The lesion was previously treated using a stent (unknown type) at an unknown date. Previously placed stent displays restenosis.  First Obtuse Marginal Branch  Vessel is small in size.  Second Obtuse Marginal Branch  Vessel is small in size.  First Left Posterolateral Branch  Vessel is small in size.  Left Atrioventricular Groove Continuation  Vessel is moderate in size.  Right Coronary Artery  Prox RCA lesion 40% stenosed  Prox RCA lesion is 40% stenosed.  Prox RCA to Mid RCA lesion 5% stenosed  Prox RCA to Mid RCA lesion is 5% stenosed. The lesion is concentric. The lesion was previously treated using a stent (unknown type) between 1-2 years ago. Previously placed stent displays restenosis.  Mid RCA to Dist RCA lesion 0% stenosed  Previously placed Mid RCA to Dist RCA stent (unknown type) is widely patent.  Dist RCA lesion 40% stenosed  Dist RCA lesion is 40% stenosed. The lesion is focal.  Acute Marginal Branch  Vessel is small in size.  Inferior Septal  Vessel is small in size.  Right Posterior Atrioventricular Branch  Vessel is small in size.  Post Atrio lesion 65% stenosed  Post Atrio lesion is 65% stenosed. The lesion is discrete.  First Right Posterolateral  Vessel is small in size.  Intervention   No interventions  have been documented.  Wall Motion   Resting    No LV gram        Left Heart   Left Ventricle Severe systolic dysfunction by echocardiogram LV end diastolic pressure is moderately elevated.  Coronary Diagrams   Diagnostic Diagram       Implants    Vascular Products   Closure Mynx Control [id number] Implanted    Inventory item: CLOSURE [facility] LLCCONTROL 27F Model/Cat number: [id number] Manufacturer: [place]: [id number] Device identifier: [id number] identifier type: GS1  Area Of Implantation: Groin    GUDID Information   Request status Successful    Brand name: MYNX CONTROL Version/Model: [id number] Company name: ASullysafety info as of 08/23/18: MR Safe  Contains dry or latex rubber: No    GMDN P.T. name: Wound hydrogel dressing, sterile    As of 08/23/2018   Status: Implanted      MERGE Images   Show images for CARDIAC CATHETERIZATION   Link to Procedure Log   Procedure Log    Hemo Data    Most Recent Value  AO Systolic Pressure 1456mmHg  AO Diastolic Pressure 56 mmHg  AO Mean 88 mmHg  LV Systolic Pressure 1256mmHg  LV Diastolic Pressure 10 mmHg  LV EDP 20 mmHg  AOp Systolic Pressure 1389mmHg  AOp Diastolic Pressure 52 mmHg  AOp Mean Pressure 76 mmHg  LVp Systolic Pressure 1373mmHg  LVp Diastolic Pressure 11 mmHg  LVp EDP Pressure 21 mmHg    Assessment/Plan:  This 67year old woman has high-grade distal left main stenosis extending into the ostium of the LAD and left circumflex coronary arteries with ejection fraction 15 to 20% by echocardiogram, reportedly down from 45% in April at the time of her last stenting procedure.  This is most likely ischemic cardiomyopathy although there is some question about her frequent PVCs and if this could be causing a cardiomyopathy.  She presented with chest discomfort as well as congestive heart failure symptoms with an elevated BNP of 532 consistent with acute on chronic combined systolic and diastolic congestive heart failure.  She has poorly controlled diabetes as well as stage III chronic kidney disease with a creatinine of 1.5-1.7.  I think coronary bypass graft surgery is probably the best treatment for her although she is at high risk due to  her low ejection fraction and comorbid risk factors.  It is unknown if her left ventricular systolic function will improve after revascularization and she could continue to have a low ejection fraction with congestive heart failure.  She could require a temporary left ventricular assist device postoperatively and is at high risk for postoperative renal failure and need for temporary or permanent dialysis.  PCI may be an option although it would still be at high risk due to the anatomy and location of the stenosis with poor ejection fraction and would require Impella support.  Her long-term results with left main stenting may not be very good with diabetes.  I discussed the surgical alternative with the patient and her son as interpreter as well as her husband.  Her son had lots of good questions and would like to think about treatment further and discuss it again with cardiology.  She has been on Plavix previously which was stopped after her catheterization and she would have to wait until Monday before having coronary bypass graft surgery.  I think would have been helpful to  have a right heart catheterization with her low ejection fraction because if her cardiac output and pulmonary pressures are fairly normal it would bode well for coronary bypass graft surgery.  I will return to talk with the family again after they have had some time to think about things and if they decided to proceed with coronary bypass surgery I would tentatively plan to do it on Monday.  I spent 60 minutes performing this consultation and > 50% of this time was spent face to face counseling and coordinating the care of this patient's high-grade left main and severe multivessel coronary disease with severe ischemic cardiomyopathy.  Gaye Pollack 08/25/2018, 5:56 AM

## 2018-08-25 NOTE — Interval H&P Note (Signed)
Cath Lab Visit (complete for each Cath Lab visit)  Clinical Evaluation Leading to the Procedure:   ACS: No.  Non-ACS:    Anginal Classification: CCS Boyd  Anti-ischemic medical therapy: Maximal Therapy (2 or more classes of medications)  Non-Invasive Test Results: No non-invasive testing performed  Prior CABG: No previous CABG      History and Physical Interval Note:  08/25/2018 3:36 PM  Joy Boyd  has presented today for surgery, with the diagnosis of hf  The various methods of treatment have been discussed with the patient and family. After consideration of risks, benefits and other options for treatment, the patient has consented to  Procedure(s): RIGHT HEART CATH (N/A) as a surgical intervention .  The patient's history has been reviewed, patient examined, no change in status, stable for surgery.  I have reviewed the patient's chart and labs.  Questions were answered to the patient's satisfaction.     Joy Boyd

## 2018-08-25 NOTE — Progress Notes (Signed)
ANTICOAGULATION CONSULT NOTE - Follow Up Consult  Pharmacy Consult for Heparin Indication: multiple stent restenosis  No Known Allergies  Patient Measurements: Height: 5\' 1"  (154.9 cm) Weight: 154 lb 12.8 oz (70.2 kg) IBW/kg (Calculated) : 47.8 Heparin Dosing Weight: 62.9  Vital Signs: Temp: 97.8 F (36.6 C) (10/31 1626) Temp Source: Oral (10/31 1626) BP: 138/62 (10/31 1626) Pulse Rate: 68 (10/31 1626)  Labs: Recent Labs    08/23/18 0419  08/24/18 0126 08/24/18 0753 08/24/18 1547 08/25/18 0217  HGB 11.6*  --   --  12.2  --  10.5*  HCT 39.3  --   --  40.3  --  35.5*  PLT 165  --   --  150  --  155  HEPARINUNFRC  --   --   --  0.29* 0.28* 0.71*  CREATININE 1.85*   < > 1.57* 1.57*  --  1.49*   < > = values in this interval not displayed.    Estimated Creatinine Clearance: 33.8 mL/min (A) (by C-G formula based on SCr of 1.49 mg/dL (H)).  Assessment: [name] admitted with CP, SOB, and hx of CAD s/p multiple stents that have restenosed. Pt on heparin while pt decides decision regarding CABG v PCI. Tentative plans for CABG on Monday 11/4.  Heparin held on call to cath lab. Pt s/p RHC (10/31) for further eval prior to possible surgery. Sheath removed ~1600. Pharmacy consulted to restart heparin 8 hours post sheath removal.  Goal of Therapy:  Heparin level 0.3-0.7 units/ml Monitor platelets by anticoagulation protocol: Yes   Plan:  - At 2400, restart Heparin 950 units/hr - Check heparin level 8 hours post restart - Continue to monitor CBC and heparin level daily and signs of bleeding  Sherlon Handing, PharmD, BCPS Clinical pharmacist  **Pharmacist phone directory can now be found on [email or web address] ([email or web address]).  Listed under [place]. 08/25/2018,4:37 PM

## 2018-08-25 NOTE — Progress Notes (Signed)
[facility]                                                                              Patient Demographics  Joy Boyd, is a 67 y.o. female, DOB - December 25, 1950, PPI:[id number]  Admit date - 08/20/2018   Admitting Physician Colbert Ewing, MD  Outpatient Primary MD for the patient is Patient, No Pcp Per  Outpatient specialists:   LOS - 3  days   Medical records reviewed and are as summarized below:   Chief Complaint  Patient presents with  . Chest Pain  . Cough       Brief summary    Briefly 67 year old female, visiting her son and daughter-in-law from [place], came a month ago, with history of hypertension, multivessel CAD with recent stents to RCA, left circumflex and LAD,  cath in April 2019 presented with increasing dyspnea, productive cough for 1 months, atypical chest pain, at times pleuritic and radiating to the left arm.  She was admitted for unstable angina.  Cardiology was consulted, she underwent ischemic work-up including abnormal stress test and left heart cath. TCTS consulted and considering CABG.  Cardiology plans right heart cath on 10/31.  Course complicated by acute lower UTI.  Assessment & Plan    Principal Problem:    Unstable angina [facility]): In the setting of recent stents, multivessel CAD, acute bronchitis, GERD - d-dimer was elevated, CT angiogram of the chest negative for PE  - 2D echo showed EF of 15 to 20%, global hypokinesis, per son at the bedside EF was 45% in April 2019  - Patient underwent stress test which showed pharmacologically induced ischemia in the lateral wall of left ventricle and septum, moderate left ventricular dilatation, severe left ventricular global hypokinesia with relative akinesia of the septum, EF 24%. - Continue aspirin, atorvastatin, bisoprolol changed to carvedilol, amlodipine changed to BiDil and IV heparin.  Plavix held 10/29 and kept on hold for possible CABG 11/3. -Cardiac cath 10/29 showed severe  stenosis in the distal left main artery involving the ostium of the LAD and circumflex.  Severe restenosis of distal RCA stent.  Cardiology felt that CABG was her best revascularization option.  - TCTS input appreciated and are considering CABG on 11/4.  Cardiology plans [place] 10/31 for further work-up prior to CABG.  Cardiology has discussed with patient and family and advised that CABG would be the preferred option over high risk PCI.  Active problems Acute bronchitis Improving, continue nebs, Zithromax, Robitussin, Mucinex.  Improved and stable.  GERD -Continue PPI, Maalox as needed.  Patient's epigastric pain resolves with Maalox.  Increased Protonix to twice daily.  Uncontrolled diabetes mellitus with hyperglycemia, type II, insulin-dependent Hemoglobin A1c 9.3 Diabetes coordinator assisting with management.  Had hypoglycemic episode/CBG 48 on 10/29 afternoon, possibly related to poor oral intake for cath, receiving insulins later than supposed to.  As per diabetes coordinator recommendation, given fluctuating oral intake, transitioned to Lantus 24 units at bedtime along with SSI.  CBGs in the 200s this morning.  Increase Lantus to 28 units at bedtime and added bedtime SSI.  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  Creatinine has improved to 1.4.  Follow BMP in a.m.  Bicarbonate however has dropped from 17-15, unclear etiology-could be related to hyperchloremia.  Bicarbonate up to 18 today.  Anion gap normal.  Essential hypertension Bisoprolol changed to carvedilol 12.5 mg twice daily, amlodipine changed to BiDil 20-37.5 mg 3 times daily.  Monitor closely.  Hyperlipidemia Continue atorvastatin.  Chronic combined systolic and diastolic CHF: Cardiomyopathy possibly due to ischemia and a component of PVC induced CM.  Now on bisoprolol and BiDil.  Clinically euvolemic.  E. coli acute cystitis. Reported  on 10/30.  Urine microscopy suspicious for UTI.  Started IV ceftriaxone pending culture results.  Urine culture shows >100,000 K of gram-negative rods, follow final sensitivities.   Code Status: Full code DVT Prophylaxis: Currently on IV heparin infusion. Family Communication: Discussed in detail with patient's son at bedside.  Updated care and answered questions.  Disposition Plan: To be determined pending further evaluation and management.  Time Spent in minutes   35 minutes  Procedures:  2D echo Nuclear medicine stress test; results as above Cardiac cath 10/29  Consultants:   Cardiology TCTS  Antimicrobials:  Zithromax IV ceftriaxone 10/30 >  Medications  Scheduled Meds: . [MAR Hold] aspirin EC  81 mg Oral Daily  . [MAR Hold] atorvastatin  80 mg Oral Daily  . [MAR Hold] budesonide  0.25 mg Inhalation BID  . [MAR Hold] carvedilol  12.5 mg Oral BID WC  . [MAR Hold] guaiFENesin  600 mg Oral BID  . [MAR Hold] insulin aspart  0-9 Units Subcutaneous TID WC  . [MAR Hold] insulin glargine  25 Units Subcutaneous QHS  . [MAR Hold] isosorbide-hydrALAZINE  1 tablet Oral TID  . [MAR Hold] pantoprazole  40 mg Oral BID AC  . [MAR Hold] sodium chloride flush  3 mL Intravenous Q12H  . sodium chloride flush  3 mL Intravenous Q12H   Continuous Infusions: . [MAR Hold] sodium chloride    . sodium chloride    . sodium chloride    . [MAR Hold] cefTRIAXone (ROCEPHIN)  IV Stopped (08/24/18 1848)  . heparin Stopped (08/25/18 1500)   PRN Meds:.[MAR Hold] sodium chloride, sodium chloride, [MAR Hold] alum & mag hydroxide-simeth, [MAR Hold] diphenhydrAMINE-zinc acetate, [MAR Hold] guaiFENesin-dextromethorphan, [MAR Hold] hydrALAZINE, [MAR Hold] ipratropium-albuterol, [MAR Hold] nitroGLYCERIN, [MAR Hold] ondansetron (ZOFRAN) IV, [MAR Hold] sodium chloride flush, sodium chloride flush   Antibiotics   Anti-infectives (From admission, onward)   Start     Dose/Rate Route Frequency Ordered Stop    08/24/18 1730  [MAR Hold]  cefTRIAXone (ROCEPHIN) 1 g in sodium chloride 0.9 % 100 mL IVPB     (MAR Hold since Thu 08/25/2018 at 1502. Reason: Transfer to a Procedural area.)   1 g 200 mL/hr over 30 Minutes Intravenous Every 24 hours 08/24/18 1717     08/22/18 1000  azithromycin (ZITHROMAX) tablet 250 mg     250 mg Oral Daily 08/21/18 0405 08/25/18 0935   08/21/18 0500  azithromycin (ZITHROMAX) tablet 500 mg     500 mg Oral Daily 08/21/18 0405 08/21/18 0606        Subjective:   Seen this morning prior to procedure.  Interviewed and examined along with son at bedside who assisted with interpretation.  Patient feels much better.  No anginal type of chest pain.  Epigastric pain yesterday and resolves with Maalox after 15 to 20 minutes.  Patient has periods of anxiety pertaining to her hospital course.  Dysuria much improved.  Had minimal hematuria yesterday which has also cleared.  Menopausal.  Some itching at IV site right forearm without rash.  Objective:   Vitals:   08/25/18 1300 08/25/18 1400 08/25/18 1500 08/25/18 1523  BP: 135/85 (!) 150/133 (!) 166/132   Pulse: (!) 58 62 (!) 37   Resp: 15 17 (!) 21   Temp:      TempSrc:      SpO2: 94% 98% 100% 98%  Weight:      Height:        Intake/Output Summary (Last 24 hours) at 08/25/2018 1529 Last data filed at 08/25/2018 1501 Gross per 24 hour  Intake 338.12 ml  Output -  Net 338.12 ml     Wt Readings from Last 3 Encounters:  08/23/18 70.2 kg     Exam  General: Pleasant middle-aged female, moderately built and overweight lying comfortably propped up in bed.  Stable  Cardiovascular: S1 S2 auscultated, Regular rate and rhythm.  No JVD, murmurs or pedal edema.  Telemetry personally reviewed: SR with BBB morphology and few PVCs.  Respiratory: Clear to auscultation bilaterally, no wheezing, rales or rhonchi.  No increased work of breathing.  Stable  Gastrointestinal: Nondistended and soft.  Mild epigastric tenderness without  rigidity, guarding or rebound.  No organomegaly or masses appreciated.  Normal bowel sounds heard.  Stable  Ext: Symmetric 5 x 5 power.  Mild bruising at right cubital fossa, possibly at site of IV access but no rash.  Neuro: Alert and oriented x3.  No focal neurological deficits.  Stable  Psych: Normal affect and demeanor.   Data Reviewed:  I have personally reviewed following labs and imaging studies  Micro Results Recent Results (from the past 240 hour(s))  Culture, Urine     Status: Abnormal (Preliminary result)   Collection Time: 08/24/18  8:56 AM  Result Value Ref Range Status   Specimen Description URINE, RANDOM  Final   Special Requests NONE  Final   Culture (A)  Final    >=100,000 COLONIES/mL ESCHERICHIA COLI SUSCEPTIBILITIES TO FOLLOW Performed at [facility], [address] [place], [place] [address]    Report Status PENDING  Incomplete    Radiology Reports Dg Chest 2 View  Result Date: 08/20/2018 CLINICAL DATA:  Cough and chest pain EXAM: CHEST - 2 VIEW COMPARISON:  None. FINDINGS: Lungs are clear. Heart size and pulmonary vascularity are normal. No adenopathy. There is aortic atherosclerosis. There are multiple foci of coronary artery calcification. No bone lesions. IMPRESSION: Multifocal coronary artery calcification. Aortic atherosclerosis. No edema or consolidation. Heart size normal. Electronically Signed   By: Lowella Grip III M.D.   On: 08/20/2018 23:09   Ct Angio Chest Pe W Or Wo Contrast  Result Date: 08/21/2018 CLINICAL DATA:  Increased dyspnea and chest pain over the past month. Pt also having productive cough. Elevated d-dimer. Symptoms began after a long plane ride from [place] to here to visit her son. EXAM: CT ANGIOGRAPHY CHEST WITH CONTRAST TECHNIQUE: Multidetector CT imaging of the chest was performed using the standard protocol during bolus administration of intravenous contrast. Multiplanar CT image reconstructions and MIPs were obtained  to evaluate the vascular anatomy. CONTRAST:  70mL ISOVUE-370 IOPAMIDOL (ISOVUE-370) INJECTION 76% COMPARISON:  Chest radiographs, 08/20/2018. FINDINGS: Cardiovascular: There is satisfactory opacification of the pulmonary arteries to the segmental level. There is no evidence of a pulmonary embolism. Heart is mildly enlarged. Three-vessel coronary artery calcifications. No pericardial effusion. Mild aortic atherosclerotic calcifications. Mediastinum/Nodes:  No neck base, axillary, mediastinal or hilar masses or enlarged lymph nodes. Trachea and esophagus are unremarkable. Lungs/Pleura: No evidence of pneumonia or pulmonary edema. Mild bronchial wall thickening in the lower lobes. Mild dependent subsegmental atelectasis most evident at the left posterolateral lung base. No lung mass or suspicious nodule. No pleural effusion or pneumothorax. Upper Abdomen: No acute abnormality. Musculoskeletal: No chest wall abnormality. No acute or significant osseous findings. Review of the MIP images confirms the above findings. IMPRESSION: 1. No evidence of a pulmonary embolism. 2. Mild cardiomegaly. Coronary artery calcifications and aortic atherosclerosis. 3. No evidence of pneumonia or pulmonary edema. Mild bronchial wall thickening in the lower lobes. This is consistent with acute bronchial infection/inflammation given the history of productive cough. Aortic Atherosclerosis (ICD10-I70.0). Electronically Signed   By: Lajean Manes M.D.   On: 08/21/2018 17:02   Nm Myocar Multi W/spect W/wall Motion / Ef  Result Date: 08/22/2018 CLINICAL DATA:  Chest pain and shortness of breath. History of CAD post previous coronary stent placement. History of diabetes and hypertension. EXAM: MYOCARDIAL IMAGING WITH SPECT (REST AND PHARMACOLOGIC-STRESS) GATED LEFT VENTRICULAR WALL MOTION STUDY LEFT VENTRICULAR EJECTION FRACTION TECHNIQUE: Standard myocardial SPECT imaging was performed after resting intravenous injection of 10 mCi Tc-43m  tetrofosmin. Subsequently, intravenous infusion of Lexiscan was performed under the supervision of the Cardiology staff. At peak effect of the drug, 30 mCi Tc-1m tetrofosmin was injected intravenously and standard myocardial SPECT imaging was performed. Quantitative gated imaging was also performed to evaluate left ventricular wall motion, and estimate left ventricular ejection fraction. COMPARISON:  Chest CT-08/21/2018 FINDINGS: Raw images: Mild GI attenuation is seen worse on the provided stress images. No significant breast or chest wall attenuation. No significant patient motion artifact. Perfusion: There is a moderate sized area of decreased perfusion involving the lateral wall of the left ventricle as well as a small area of decreased profusion involving the apical aspect of the septum on the provided stress images worrisome for areas of pharmacologically induced ischemia. No definitive matched areas of decreased perfusion to suggest prior infarction. Wall Motion: The left ventricle is moderately dilated. There is severe global hypokinesia with relative akinesia involving the septum. Left Ventricular Ejection Fraction: 24 % End diastolic volume 182 ml End systolic volume 96 ml IMPRESSION: 1. Suspected areas of pharmacologically induced ischemia involving the lateral wall of the left ventricle and septum. 2. No definitive scintigraphic evidence of prior infarction. 3. Moderate left ventricular dilatation. 4. Severe left ventricular global hypokinesia with relative akinesia of the septum. Ejection fraction - 24% Electronically Signed   By: Sandi Mariscal M.D.   On: 08/22/2018 14:27   Vas [place] Burnard Bunting With/wo Tbi  Result Date: 08/22/2018 LOWER EXTREMITY DOPPLER STUDY Indications: Claudication.  Performing Technologist: Carlos Levering RVT  Examination Guidelines: A complete evaluation includes at minimum, Doppler waveform signals and systolic blood pressure reading at the level of bilateral brachial, anterior tibial,  and posterior tibial arteries, when vessel segments are accessible. Bilateral testing is considered an integral part of a complete examination. Photoelectric Plethysmograph (PPG) waveforms and toe systolic pressure readings are included as required and additional duplex testing as needed. Limited examinations for reoccurring indications may be performed as noted.  ABI Findings: +--------+------------------+-----+---------+--------+ Right   Rt Pressure (mmHg)IndexWaveform Comment  +--------+------------------+-----+---------+--------+ Brachial130                    triphasic         +--------+------------------+-----+---------+--------+ PTA     157  1.05 triphasic         +--------+------------------+-----+---------+--------+ DP      143               0.95 triphasic         +--------+------------------+-----+---------+--------+ +--------+------------------+-----+---------+-------+ Left    Lt Pressure (mmHg)IndexWaveform Comment +--------+------------------+-----+---------+-------+ Brachial150                    triphasic        +--------+------------------+-----+---------+-------+ PTA     171               1.14 triphasic        +--------+------------------+-----+---------+-------+ DP      145               0.97 triphasic        +--------+------------------+-----+---------+-------+ +-------+-----------+-----------+------------+------------+ ABI/TBIToday's ABIToday's TBIPrevious ABIPrevious TBI +-------+-----------+-----------+------------+------------+ Right  1.05                                           +-------+-----------+-----------+------------+------------+ Left   1.14                                           +-------+-----------+-----------+------------+------------+  Summary: Right: Resting right ankle-brachial index is within normal range. No evidence of significant right lower extremity arterial disease. Left: Resting left  ankle-brachial index is within normal range. No evidence of significant left lower extremity arterial disease.  *See table(s) above for measurements and observations.  Electronically signed by Deitra Mayo MD on 08/22/2018 at 5:27:53 PM.   Final     Lab Data:  CBC: Recent Labs  Lab 08/21/18 0412 08/22/18 0132 08/23/18 0419 08/24/18 0753 08/25/18 0217  WBC 9.8 9.1 11.1* 10.2 10.7*  HGB 11.5* 10.8* 11.6* 12.2 10.5*  HCT 38.0 35.1* 39.3 40.3 35.5*  MCV 87.2 85.6 87.1 86.9 87.4  PLT 187 135* 165 150 937   Basic Metabolic Panel: Recent Labs  Lab 08/22/18 0132 08/23/18 0419 08/23/18 1132 08/23/18 1438 08/24/18 0126 08/24/18 0753 08/25/18 0217  NA 134* 136 135 137 140 140 135  K 5.3* 5.2* 5.0 5.8* 4.6 4.9 4.8  CL 108 110 111 113* 112* 117* 112*  CO2 19* 17* 18* 17* 20* 15* 18*  GLUCOSE 430* 260* 387* 240* 161* 160* 285*  BUN 40* 44* 44* 41* 34* 31* 29*  CREATININE 1.76* 1.85* 1.79* 1.63* 1.57* 1.57* 1.49*  CALCIUM 8.7* 9.0 8.9 8.9 9.1 8.9 8.5*  MG 1.6* 1.6*  --   --   --  2.3 2.1   GFR: Estimated Creatinine Clearance: 33.8 mL/min (A) (by C-G formula based on SCr of 1.49 mg/dL (H)). Liver Function Tests: Recent Labs  Lab 08/24/18 1000  AST 13*  ALT 14  ALKPHOS 93  BILITOT 0.4  PROT 6.2*  ALBUMIN 2.9*   Cardiac Enzymes: Recent Labs  Lab 08/21/18 0412 08/21/18 1127 08/21/18 1822 08/22/18 0132  TROPONINI <0.03 <0.03 <0.03 <0.03   HbA1C: No results for input(s): HGBA1C in the last 72 hours. CBG: Recent Labs  Lab 08/24/18 2209 08/25/18 0913 08/25/18 1128 08/25/18 1152 08/25/18 1426  GLUCAP 319* 271* 227* 223* 179*   Lipid Profile: Recent Labs    08/24/18 0930  CHOL 116  HDL 31*  LDLCALC 51  TRIG 171*  CHOLHDL  3.7   Urine analysis:    Component Value Date/Time   COLORURINE YELLOW 08/24/2018 0856   APPEARANCEUR CLOUDY (A) 08/24/2018 0856   LABSPEC 1.012 08/24/2018 0856   PHURINE 5.0 08/24/2018 0856   GLUCOSEU NEGATIVE 08/24/2018 0856     HGBUR LARGE (A) 08/24/2018 0856   BILIRUBINUR NEGATIVE 08/24/2018 0856   KETONESUR NEGATIVE 08/24/2018 0856   PROTEINUR 100 (A) 08/24/2018 0856   NITRITE NEGATIVE 08/24/2018 0856   LEUKOCYTESUR LARGE (A) 08/24/2018 0856     Vernell Leep, MD, FACP, [facility]. [facility] Pager [phone]  If 7PM-7AM, please contact night-coverage [email or web address] Password [email or web address] 08/25/2018, 3:29 PM

## 2018-08-26 ENCOUNTER — Inpatient Hospital Stay (HOSPITAL_COMMUNITY): Payer: Medicaid Other

## 2018-08-26 ENCOUNTER — Encounter (HOSPITAL_COMMUNITY): Payer: Self-pay | Admitting: Interventional Cardiology

## 2018-08-26 DIAGNOSIS — Z01810 Encounter for preprocedural cardiovascular examination: Secondary | ICD-10-CM

## 2018-08-26 DIAGNOSIS — I25119 Atherosclerotic heart disease of native coronary artery with unspecified angina pectoris: Secondary | ICD-10-CM

## 2018-08-26 LAB — BASIC METABOLIC PANEL
ANION GAP: 6 (ref 5–15)
BUN: 28 mg/dL — ABNORMAL HIGH (ref 8–23)
CALCIUM: 8.7 mg/dL — AB (ref 8.9–10.3)
CO2: 17 mmol/L — AB (ref 22–32)
CREATININE: 1.55 mg/dL — AB (ref 0.44–1.00)
Chloride: 113 mmol/L — ABNORMAL HIGH (ref 98–111)
GFR calc non Af Amer: 34 mL/min — ABNORMAL LOW (ref >60)
GFR, EST AFRICAN AMERICAN: 39 mL/min — AB (ref >60)
GLUCOSE: 290 mg/dL — AB (ref 70–99)
Potassium: 4.5 mmol/L (ref 3.5–5.1)
Sodium: 136 mmol/L (ref 135–145)

## 2018-08-26 LAB — GLUCOSE, CAPILLARY
GLUCOSE-CAPILLARY: 137 mg/dL — AB (ref 70–99)
Glucose-Capillary: 366 mg/dL — ABNORMAL HIGH (ref 70–99)
Glucose-Capillary: 246 mg/dL — ABNORMAL HIGH (ref 70–99)
Glucose-Capillary: 316 mg/dL — ABNORMAL HIGH (ref 70–99)
Glucose-Capillary: 229 mg/dL — ABNORMAL HIGH (ref 70–99)

## 2018-08-26 LAB — HEPARIN LEVEL (UNFRACTIONATED): Heparin Unfractionated: 0.5 IU/mL (ref 0.30–0.70)

## 2018-08-26 LAB — CBC
HEMATOCRIT: 31.9 % — AB (ref 36.0–46.0)
Hemoglobin: 9.8 g/dL — ABNORMAL LOW (ref 12.0–15.0)
MCH: 26.4 pg (ref 26.0–34.0)
MCHC: 30.7 g/dL (ref 30.0–36.0)
MCV: 86 fL (ref 80.0–100.0)
NRBC: 0 % (ref 0.0–0.2)
Platelets: 151 10*3/uL (ref 150–400)
RBC: 3.71 MIL/uL — ABNORMAL LOW (ref 3.87–5.11)
RDW: 14.4 % (ref 11.5–15.5)
WBC: 9 10*3/uL (ref 4.0–10.5)

## 2018-08-26 LAB — URINE CULTURE: Culture: >=100000 — AB

## 2018-08-26 LAB — MAGNESIUM: Magnesium: 1.7 mg/dL (ref 1.7–2.4)

## 2018-08-26 MED ORDER — NITROGLYCERIN IN D5W 200-5 MCG/ML-% IV SOLN
2.0000 ug/min | INTRAVENOUS | Status: DC
  Filled 2018-08-26: qty 250

## 2018-08-26 MED ORDER — PLASMA-LYTE 148 IV SOLN
INTRAVENOUS | Status: AC
  Administered 2018-08-29: 500 mL
  Filled 2018-08-26: qty 2.5

## 2018-08-26 MED ORDER — SODIUM CHLORIDE 0.9 % IV SOLN
INTRAVENOUS | Status: DC
  Filled 2018-08-26: qty 30

## 2018-08-26 MED ORDER — MAGNESIUM SULFATE 50 % IJ SOLN
40.0000 meq | INTRAMUSCULAR | Status: DC
  Filled 2018-08-26: qty 9.85

## 2018-08-26 MED ORDER — TRANEXAMIC ACID (OHS) PUMP PRIME SOLUTION
2.0000 mg/kg | INTRAVENOUS | Status: DC
  Filled 2018-08-26: qty 1.4

## 2018-08-26 MED ORDER — VANCOMYCIN HCL 10 G IV SOLR
1250.0000 mg | INTRAVENOUS | Status: AC
  Administered 2018-08-29: 1250 mg via INTRAVENOUS
  Filled 2018-08-26: qty 1250

## 2018-08-26 MED ORDER — SODIUM CHLORIDE 0.9 % IV SOLN
750.0000 mg | INTRAVENOUS | Status: DC
  Filled 2018-08-26: qty 750

## 2018-08-26 MED ORDER — TRANEXAMIC ACID 1000 MG/10ML IV SOLN
1.5000 mg/kg/h | INTRAVENOUS | Status: AC
  Administered 2018-08-29: 1.5 mg/kg/h via INTRAVENOUS
  Filled 2018-08-26: qty 25

## 2018-08-26 MED ORDER — DOPAMINE-DEXTROSE 3.2-5 MG/ML-% IV SOLN
0.0000 ug/kg/min | INTRAVENOUS | Status: AC
  Administered 2018-08-29: 2 ug/kg/min via INTRAVENOUS
  Filled 2018-08-26: qty 250

## 2018-08-26 MED ORDER — NOREPINEPHRINE 4 MG/250ML-% IV SOLN
0.0000 ug/min | INTRAVENOUS | Status: DC
  Administered 2018-08-29: 2 ug/min via INTRAVENOUS
  Filled 2018-08-26: qty 250

## 2018-08-26 MED ORDER — MAGNESIUM SULFATE 2 GM/50ML IV SOLN
2.0000 g | Freq: Once | INTRAVENOUS | Status: AC
  Administered 2018-08-26: 2 g via INTRAVENOUS
  Filled 2018-08-26: qty 50

## 2018-08-26 MED ORDER — TRANEXAMIC ACID (OHS) BOLUS VIA INFUSION
15.0000 mg/kg | INTRAVENOUS | Status: AC
  Administered 2018-08-29: 1053 mg via INTRAVENOUS
  Filled 2018-08-26: qty 1053

## 2018-08-26 MED ORDER — ACETAMINOPHEN 325 MG PO TABS
650.0000 mg | ORAL_TABLET | Freq: Once | ORAL | Status: AC
  Administered 2018-08-26: 650 mg via ORAL
  Filled 2018-08-26: qty 2

## 2018-08-26 MED ORDER — INSULIN ASPART 100 UNIT/ML ~~LOC~~ SOLN: 0.0000 [IU] | Freq: Every day | SUBCUTANEOUS | Status: DC

## 2018-08-26 MED ORDER — INSULIN REGULAR(HUMAN) IN NACL 100-0.9 UT/100ML-% IV SOLN
INTRAVENOUS | Status: AC
  Administered 2018-08-29: 2.3 [IU]/h via INTRAVENOUS
  Filled 2018-08-26: qty 100

## 2018-08-26 MED ORDER — INSULIN ASPART 100 UNIT/ML ~~LOC~~ SOLN
0.0000 [IU] | Freq: Three times a day (TID) | SUBCUTANEOUS | Status: DC
  Administered 2018-08-27: 2 [IU] via SUBCUTANEOUS
  Administered 2018-08-27: 8 [IU] via SUBCUTANEOUS
  Administered 2018-08-27 – 2018-08-28 (×2): 3 [IU] via SUBCUTANEOUS

## 2018-08-26 MED ORDER — PHENYLEPHRINE HCL-NACL 20-0.9 MG/250ML-% IV SOLN
30.0000 ug/min | INTRAVENOUS | Status: DC
  Filled 2018-08-26: qty 250

## 2018-08-26 MED ORDER — MILRINONE LACTATE IN DEXTROSE 20-5 MG/100ML-% IV SOLN
0.3000 ug/kg/min | INTRAVENOUS | Status: AC
  Administered 2018-08-29: 0.375 ug/kg/min via INTRAVENOUS
  Filled 2018-08-26: qty 100

## 2018-08-26 MED ORDER — DEXMEDETOMIDINE HCL IN NACL 400 MCG/100ML IV SOLN
0.1000 ug/kg/h | INTRAVENOUS | Status: AC
  Administered 2018-08-29: .2 ug/kg/h via INTRAVENOUS
  Filled 2018-08-26: qty 100

## 2018-08-26 MED ORDER — CARVEDILOL 25 MG PO TABS
25.0000 mg | ORAL_TABLET | Freq: Two times a day (BID) | ORAL | Status: DC
  Administered 2018-08-26 – 2018-08-28 (×5): 25 mg via ORAL
  Filled 2018-08-26 (×5): qty 1

## 2018-08-26 MED ORDER — INSULIN GLARGINE 100 UNIT/ML ~~LOC~~ SOLN
35.0000 [IU] | Freq: Every day | SUBCUTANEOUS | Status: DC
  Administered 2018-08-26 – 2018-08-28 (×3): 35 [IU] via SUBCUTANEOUS
  Filled 2018-08-26 (×4): qty 0.35

## 2018-08-26 MED ORDER — SODIUM CHLORIDE 0.9 % IV SOLN
1.5000 g | INTRAVENOUS | Status: AC
  Administered 2018-08-29: 1.5 g via INTRAVENOUS
  Filled 2018-08-26: qty 1.5

## 2018-08-26 MED ORDER — CARVEDILOL 12.5 MG PO TABS
12.5000 mg | ORAL_TABLET | Freq: Once | ORAL | Status: AC
  Administered 2018-08-26: 12.5 mg via ORAL
  Filled 2018-08-26: qty 1

## 2018-08-26 MED ORDER — HYDRALAZINE HCL 25 MG PO TABS
25.0000 mg | ORAL_TABLET | Freq: Three times a day (TID) | ORAL | Status: DC
  Administered 2018-08-26 – 2018-08-29 (×9): 25 mg via ORAL
  Filled 2018-08-26 (×9): qty 1

## 2018-08-26 MED ORDER — POTASSIUM CHLORIDE 2 MEQ/ML IV SOLN
80.0000 meq | INTRAVENOUS | Status: DC
  Filled 2018-08-26: qty 40

## 2018-08-26 MED ORDER — EPINEPHRINE PF 1 MG/ML IJ SOLN
0.0000 ug/min | INTRAVENOUS | Status: AC
  Administered 2018-08-29: 3 ug/min via INTRAVENOUS
  Filled 2018-08-26: qty 4

## 2018-08-26 NOTE — Progress Notes (Signed)
[facility]                                                                              Patient Demographics  Joy Boyd, is a 67 y.o. female, DOB - 1951/07/27, XTK:[id number]  Admit date - 08/20/2018   Admitting Physician Colbert Ewing, MD  Outpatient Primary MD for the patient is Patient, No Pcp Per  Outpatient specialists:   LOS - 4  days   Medical records reviewed and are as summarized below:   Chief Complaint  Patient presents with  . Chest Pain  . Cough       Brief summary    Briefly 67 year old female, visiting her son and daughter-in-law from [place], came a month ago, with history of hypertension, multivessel CAD with recent stents to RCA, left circumflex and LAD,  cath in April 2019 presented with increasing dyspnea, productive cough for 1 months, atypical chest pain, at times pleuritic and radiating to the left arm.  She was admitted for unstable angina.  Cardiology was consulted, she underwent ischemic work-up including abnormal stress test and left heart cath. TCTS consulted and considering CABG.  Cardiology plans right heart cath on 10/31.  Course complicated by acute lower UTI.  Assessment & Plan    Principal Problem:    Unstable angina [facility]): In the setting of recent stents, multivessel CAD, acute bronchitis, GERD - d-dimer was elevated, CT angiogram of the chest negative for PE  - 2D echo showed EF of 15 to 20%, global hypokinesis, per son at the bedside EF was 45% in April 2019  - Patient underwent stress test which showed pharmacologically induced ischemia in the lateral wall of left ventricle and septum, moderate left ventricular dilatation, severe left ventricular global hypokinesia with relative akinesia of the septum, EF 24%. - Continue aspirin, atorvastatin, bisoprolol changed to carvedilol and increased to 25 mg twice daily, amlodipine discontinued, did not tolerate BiDil, started hydralazine and IV heparin.  Plavix held 10/29 and  kept on hold for possible CABG 11/3. -Cardiac cath 10/29 showed severe stenosis in the distal left main artery involving the ostium of the LAD and circumflex.  Severe restenosis of distal RCA stent.  Cardiology felt that CABG was her best revascularization option.  - TCTS input appreciated and are considering CABG on 11/4.  [place] 10/31 showed mean pulmonary capillary wedge pressure 19 mmHg, cardiac output 4.8 L/min and cardiac index 2.82.  Cardiology has discussed with patient and family and advised that CABG would be the preferred option over high risk PCI. -No recurrence of chest pain.  Active problems Acute bronchitis Improving, continue nebs, Zithromax, Robitussin, Mucinex.  Seems to have resolved.  GERD -Continue PPI, Maalox as needed.  Patient's epigastric pain resolves with Maalox.  Increased Protonix to twice daily.  Improved and stable.  Uncontrolled diabetes mellitus with hyperglycemia, type II, insulin-dependent Hemoglobin A1c 9.3 Diabetes coordinator assisting with management.  Had hypoglycemic episode/CBG 48 on 10/29 afternoon, possibly related to poor oral intake for cath, receiving insulins later than supposed to.  As per diabetes coordinator recommendation, given fluctuating oral intake, transitioned to Lantus 24 units at bedtime along with SSI.  CBGs persistently elevated as below (229- 366 today).  Increase Lantus to 35 units at bedtime, change SSI to moderate and consider adding mealtime NovoLog tomorrow.  Monitor closely.  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  Creatinine has improved to 1.4.  Follow BMP in a.m.  Bicarbonate however has dropped from 17-15, unclear etiology-could be related to hyperchloremia.  Bicarbonate up to 18 today.  Anion gap normal.  Creatinine stable.  Essential hypertension Bisoprolol changed to carvedilol 12.5 mg twice daily, BiDil discontinued and hydralazine  initiated.  Monitor closely.  Hyperlipidemia Continue atorvastatin.  Chronic combined systolic and diastolic CHF: Cardiomyopathy possibly due to ischemia and a component of PVC induced CM.  Now on bisoprolol and BiDil.  Clinically euvolemic.  E. coli acute cystitis. Reported on 10/30.  Urine microscopy suspicious for UTI.  Started IV ceftriaxone, sensitive.     Code Status: Full code DVT Prophylaxis: Currently on IV heparin infusion. Family Communication: Discussed in detail with patient's son at bedside.  Updated care and answered questions.  Disposition Plan: To be determined pending further evaluation and management.  Time Spent in minutes   35 minutes  Procedures:  2D echo Nuclear medicine stress test; results as above Cardiac cath 10/29  Consultants:   Cardiology TCTS  Antimicrobials:  Zithromax IV ceftriaxone 10/30 >  Medications  Scheduled Meds: . aspirin EC  81 mg Oral Daily  . atorvastatin  80 mg Oral Daily  . budesonide  0.25 mg Inhalation BID  . carvedilol  25 mg Oral BID WC  . guaiFENesin  600 mg Oral BID  . [START ON 08/29/2018] heparin-papaverine-plasmalyte irrigation   Irrigation To OR  . hydrALAZINE  25 mg Oral Q8H  . insulin aspart  0-9 Units Subcutaneous TID WC  . insulin glargine  28 Units Subcutaneous QHS  . [START ON 08/29/2018] insulin   Intravenous To OR  . [START ON 08/29/2018] magnesium sulfate  40 mEq Other To OR  . pantoprazole  40 mg Oral BID AC  . [START ON 08/29/2018] phenylephrine  30-200 mcg/min Intravenous To OR  . [START ON 08/29/2018] potassium chloride  80 mEq Other To OR  . sodium chloride flush  3 mL Intravenous Q12H  . [START ON 08/29/2018] tranexamic acid  15 mg/kg Intravenous To OR  . [START ON 08/29/2018] tranexamic acid  2 mg/kg Intracatheter To OR   Continuous Infusions: . sodium chloride    . cefTRIAXone (ROCEPHIN)  IV 1 g (08/26/18 1735)  . [START ON 08/29/2018] cefUROXime (ZINACEF)  IV    . [START ON 08/29/2018]  cefUROXime (ZINACEF)  IV    . [START ON 08/29/2018] dexmedetomidine    . [START ON 08/29/2018] DOPamine    . [START ON 08/29/2018] epinephrine    . [START ON 08/29/2018] heparin 30,000 units/NS 1000 mL solution for CELLSAVER    . heparin 950 Units/hr (08/26/18 0630)  . magnesium sulfate 1 - 4 g bolus IVPB 2 g (08/26/18 1906)  . [START ON 08/29/2018] milrinone    . [START ON 08/29/2018] nitroGLYCERIN    . [START ON 08/29/2018] norepinephrine (LEVOPHED) Adult infusion    . [START ON 08/29/2018] tranexamic acid (CYKLOKAPRON) infusion ([facility])    . [START ON 08/29/2018] vancomycin     PRN Meds:.sodium chloride, acetaminophen, alum & mag hydroxide-simeth, diphenhydrAMINE-zinc acetate, guaiFENesin-dextromethorphan, hydrALAZINE, ipratropium-albuterol, nitroGLYCERIN, ondansetron (ZOFRAN) IV, sodium chloride flush   Antibiotics   Anti-infectives (From admission, onward)   Start  Dose/Rate Route Frequency Ordered Stop   08/29/18 0400  vancomycin (VANCOCIN) 1,250 mg in sodium chloride 0.9 % 250 mL IVPB     1,250 mg 166.7 mL/hr over 90 Minutes Intravenous To Surgery 08/26/18 1031 08/30/18 0400   08/29/18 0400  cefUROXime (ZINACEF) 1.5 g in sodium chloride 0.9 % 100 mL IVPB     1.5 g 200 mL/hr over 30 Minutes Intravenous To Surgery 08/26/18 1031 08/30/18 0400   08/29/18 0400  cefUROXime (ZINACEF) 750 mg in sodium chloride 0.9 % 100 mL IVPB     750 mg 200 mL/hr over 30 Minutes Intravenous To Surgery 08/26/18 1031 08/30/18 0400   08/24/18 1730  cefTRIAXone (ROCEPHIN) 1 g in sodium chloride 0.9 % 100 mL IVPB     1 g 200 mL/hr over 30 Minutes Intravenous Every 24 hours 08/24/18 1717     08/22/18 1000  azithromycin (ZITHROMAX) tablet 250 mg     250 mg Oral Daily 08/21/18 0405 08/25/18 0935   08/21/18 0500  azithromycin (ZITHROMAX) tablet 500 mg     500 mg Oral Daily 08/21/18 0405 08/21/18 0606        Subjective:   Seen this morning along with cardiologist.  Son at bedside.  Patient reports headache,  generalized weakness after taking BiDil, which lasted a couple of hours yesterday and then improved.  No chest pain.  No dysuria.  Objective:   Vitals:   08/26/18 0500 08/26/18 0734 08/26/18 1154 08/26/18 1702  BP: 131/86 (!) 146/70 (!) 141/71 (!) 160/50  Pulse: 67 60 87 74  Resp: 18 16  17   Temp: 98.4 F (36.9 C) 97.8 F (36.6 C) 98 F (36.7 C) 98.2 F (36.8 C)  TempSrc: Oral Oral Oral Oral  SpO2: 99% 98% 96% 97%  Weight:      Height:        Intake/Output Summary (Last 24 hours) at 08/26/2018 1954 Last data filed at 08/26/2018 1500 Gross per 24 hour  Intake 904.62 ml  Output 500 ml  Net 404.62 ml     Wt Readings from Last 3 Encounters:  08/23/18 70.2 kg     Exam  General: Pleasant middle-aged female, moderately built and overweight lying comfortably propped up in bed.  Stable  Cardiovascular: S1 S2 auscultated, Regular rate and rhythm.  No JVD, murmurs or pedal edema.  Telemetry personally reviewed: Sinus rhythm with frequent PVCs.  Respiratory: Clear to auscultation bilaterally, no wheezing, rales or rhonchi.  No increased work of breathing.  Stable  Gastrointestinal: Nondistended and soft.  Mild epigastric tenderness without rigidity, guarding or rebound.  No organomegaly or masses appreciated.  Normal bowel sounds heard.  Stable  Ext: Symmetric 5 x 5 power.  Mild bruising at right cubital fossa, possibly at site of IV access but no rash.  Neuro: Alert and oriented x3.  No focal neurological deficits.  Stable  Psych: Normal affect and demeanor.   Data Reviewed:  I have personally reviewed following labs and imaging studies  Micro Results Recent Results (from the past 240 hour(s))  Culture, Urine     Status: Abnormal   Collection Time: 08/24/18  8:56 AM  Result Value Ref Range Status   Specimen Description URINE, RANDOM  Final   Special Requests   Final    NONE Performed at [facility], [address], [place], [place] [address]    Culture  >=100,000 COLONIES/mL ESCHERICHIA COLI (A)  Final   Report Status 08/26/2018 FINAL  Final   Organism ID,  Bacteria ESCHERICHIA COLI (A)  Final      Susceptibility   Escherichia coli - MIC*    AMPICILLIN <=2 SENSITIVE Sensitive     CEFAZOLIN <=4 SENSITIVE Sensitive     CEFTRIAXONE <=1 SENSITIVE Sensitive     CIPROFLOXACIN <=0.25 SENSITIVE Sensitive     GENTAMICIN <=1 SENSITIVE Sensitive     IMIPENEM <=0.25 SENSITIVE Sensitive     NITROFURANTOIN <=16 SENSITIVE Sensitive     TRIMETH/SULFA <=20 SENSITIVE Sensitive     AMPICILLIN/SULBACTAM <=2 SENSITIVE Sensitive     PIP/TAZO <=4 SENSITIVE Sensitive     Extended ESBL NEGATIVE Sensitive     * >=100,000 COLONIES/mL ESCHERICHIA COLI  MRSA PCR Screening     Status: None   Collection Time: 08/25/18  2:55 PM  Result Value Ref Range Status   MRSA by PCR NEGATIVE NEGATIVE Final    Comment:        The GeneXpert MRSA Assay (FDA approved for NASAL specimens only), is one component of a comprehensive MRSA colonization surveillance program. It is not intended to diagnose MRSA infection nor to guide or monitor treatment for MRSA infections. Performed at [facility], [place] [address] [place], [place] [address]     Radiology Reports Dg Chest 2 View  Result Date: 08/20/2018 CLINICAL DATA:  Cough and chest pain EXAM: CHEST - 2 VIEW COMPARISON:  None. FINDINGS: Lungs are clear. Heart size and pulmonary vascularity are normal. No adenopathy. There is aortic atherosclerosis. There are multiple foci of coronary artery calcification. No bone lesions. IMPRESSION: Multifocal coronary artery calcification. Aortic atherosclerosis. No edema or consolidation. Heart size normal. Electronically Signed   By: Lowella Grip III M.D.   On: 08/20/2018 23:09   Ct Angio Chest Pe W Or Wo Contrast  Result Date: 08/21/2018 CLINICAL DATA:  Increased dyspnea and chest pain over the past month. Pt also having productive cough. Elevated d-dimer. Symptoms began  after a long plane ride from [place] to here to visit her son. EXAM: CT ANGIOGRAPHY CHEST WITH CONTRAST TECHNIQUE: Multidetector CT imaging of the chest was performed using the standard protocol during bolus administration of intravenous contrast. Multiplanar CT image reconstructions and MIPs were obtained to evaluate the vascular anatomy. CONTRAST:  72mL ISOVUE-370 IOPAMIDOL (ISOVUE-370) INJECTION 76% COMPARISON:  Chest radiographs, 08/20/2018. FINDINGS: Cardiovascular: There is satisfactory opacification of the pulmonary arteries to the segmental level. There is no evidence of a pulmonary embolism. Heart is mildly enlarged. Three-vessel coronary artery calcifications. No pericardial effusion. Mild aortic atherosclerotic calcifications. Mediastinum/Nodes: No neck base, axillary, mediastinal or hilar masses or enlarged lymph nodes. Trachea and esophagus are unremarkable. Lungs/Pleura: No evidence of pneumonia or pulmonary edema. Mild bronchial wall thickening in the lower lobes. Mild dependent subsegmental atelectasis most evident at the left posterolateral lung base. No lung mass or suspicious nodule. No pleural effusion or pneumothorax. Upper Abdomen: No acute abnormality. Musculoskeletal: No chest wall abnormality. No acute or significant osseous findings. Review of the MIP images confirms the above findings. IMPRESSION: 1. No evidence of a pulmonary embolism. 2. Mild cardiomegaly. Coronary artery calcifications and aortic atherosclerosis. 3. No evidence of pneumonia or pulmonary edema. Mild bronchial wall thickening in the lower lobes. This is consistent with acute bronchial infection/inflammation given the history of productive cough. Aortic Atherosclerosis (ICD10-I70.0). Electronically Signed   By: Lajean Manes M.D.   On: 08/21/2018 17:02   Nm Myocar Multi W/spect W/wall Motion / Ef  Result Date: 08/22/2018 CLINICAL DATA:  Chest pain and shortness of breath. History of  CAD post previous coronary stent  placement. History of diabetes and hypertension. EXAM: MYOCARDIAL IMAGING WITH SPECT (REST AND PHARMACOLOGIC-STRESS) GATED LEFT VENTRICULAR WALL MOTION STUDY LEFT VENTRICULAR EJECTION FRACTION TECHNIQUE: Standard myocardial SPECT imaging was performed after resting intravenous injection of 10 mCi Tc-74m tetrofosmin. Subsequently, intravenous infusion of Lexiscan was performed under the supervision of the Cardiology staff. At peak effect of the drug, 30 mCi Tc-16m tetrofosmin was injected intravenously and standard myocardial SPECT imaging was performed. Quantitative gated imaging was also performed to evaluate left ventricular wall motion, and estimate left ventricular ejection fraction. COMPARISON:  Chest CT-08/21/2018 FINDINGS: Raw images: Mild GI attenuation is seen worse on the provided stress images. No significant breast or chest wall attenuation. No significant patient motion artifact. Perfusion: There is a moderate sized area of decreased perfusion involving the lateral wall of the left ventricle as well as a small area of decreased profusion involving the apical aspect of the septum on the provided stress images worrisome for areas of pharmacologically induced ischemia. No definitive matched areas of decreased perfusion to suggest prior infarction. Wall Motion: The left ventricle is moderately dilated. There is severe global hypokinesia with relative akinesia involving the septum. Left Ventricular Ejection Fraction: 24 % End diastolic volume 347 ml End systolic volume 96 ml IMPRESSION: 1. Suspected areas of pharmacologically induced ischemia involving the lateral wall of the left ventricle and septum. 2. No definitive scintigraphic evidence of prior infarction. 3. Moderate left ventricular dilatation. 4. Severe left ventricular global hypokinesia with relative akinesia of the septum. Ejection fraction - 24% Electronically Signed   By: Sandi Mariscal M.D.   On: 08/22/2018 14:27   Vas [place] Burnard Bunting With/wo  Tbi  Result Date: 08/22/2018 LOWER EXTREMITY DOPPLER STUDY Indications: Claudication.  Performing Technologist: Carlos Levering RVT  Examination Guidelines: A complete evaluation includes at minimum, Doppler waveform signals and systolic blood pressure reading at the level of bilateral brachial, anterior tibial, and posterior tibial arteries, when vessel segments are accessible. Bilateral testing is considered an integral part of a complete examination. Photoelectric Plethysmograph (PPG) waveforms and toe systolic pressure readings are included as required and additional duplex testing as needed. Limited examinations for reoccurring indications may be performed as noted.  ABI Findings: +--------+------------------+-----+---------+--------+ Right   Rt Pressure (mmHg)IndexWaveform Comment  +--------+------------------+-----+---------+--------+ Brachial130                    triphasic         +--------+------------------+-----+---------+--------+ PTA     157               1.05 triphasic         +--------+------------------+-----+---------+--------+ DP      143               0.95 triphasic         +--------+------------------+-----+---------+--------+ +--------+------------------+-----+---------+-------+ Left    Lt Pressure (mmHg)IndexWaveform Comment +--------+------------------+-----+---------+-------+ Brachial150                    triphasic        +--------+------------------+-----+---------+-------+ PTA     171               1.14 triphasic        +--------+------------------+-----+---------+-------+ DP      145               0.97 triphasic        +--------+------------------+-----+---------+-------+ +-------+-----------+-----------+------------+------------+ ABI/TBIToday's ABIToday's TBIPrevious ABIPrevious TBI +-------+-----------+-----------+------------+------------+ Right  1.05                                            +-------+-----------+-----------+------------+------------+  Left   1.14                                           +-------+-----------+-----------+------------+------------+  Summary: Right: Resting right ankle-brachial index is within normal range. No evidence of significant right lower extremity arterial disease. Left: Resting left ankle-brachial index is within normal range. No evidence of significant left lower extremity arterial disease.  *See table(s) above for measurements and observations.  Electronically signed by Deitra Mayo MD on 08/22/2018 at 5:27:53 PM.   Final     Lab Data:  CBC: Recent Labs  Lab 08/22/18 0132 08/23/18 0419 08/24/18 0753 08/25/18 0217 08/26/18 0449  WBC 9.1 11.1* 10.2 10.7* 9.0  HGB 10.8* 11.6* 12.2 10.5* 9.8*  HCT 35.1* 39.3 40.3 35.5* 31.9*  MCV 85.6 87.1 86.9 87.4 86.0  PLT 135* 165 150 155 250   Basic Metabolic Panel: Recent Labs  Lab 08/22/18 0132 08/23/18 0419  08/23/18 1438 08/24/18 0126 08/24/18 0753 08/25/18 0217 08/26/18 0449  NA 134* 136   < > 137 140 140 135 136  K 5.3* 5.2*   < > 5.8* 4.6 4.9 4.8 4.5  CL 108 110   < > 113* 112* 117* 112* 113*  CO2 19* 17*   < > 17* 20* 15* 18* 17*  GLUCOSE 430* 260*   < > 240* 161* 160* 285* 290*  BUN 40* 44*   < > 41* 34* 31* 29* 28*  CREATININE 1.76* 1.85*   < > 1.63* 1.57* 1.57* 1.49* 1.55*  CALCIUM 8.7* 9.0   < > 8.9 9.1 8.9 8.5* 8.7*  MG 1.6* 1.6*  --   --   --  2.3 2.1 1.7   < > = values in this interval not displayed.   GFR: Estimated Creatinine Clearance: 32.4 mL/min (A) (by C-G formula based on SCr of 1.55 mg/dL (H)). Liver Function Tests: Recent Labs  Lab 08/24/18 1000  AST 13*  ALT 14  ALKPHOS 93  BILITOT 0.4  PROT 6.2*  ALBUMIN 2.9*   Cardiac Enzymes: Recent Labs  Lab 08/21/18 0412 08/21/18 1127 08/21/18 1822 08/22/18 0132  TROPONINI <0.03 <0.03 <0.03 <0.03   HbA1C: No results for input(s): HGBA1C in the last 72 hours. CBG: Recent Labs  Lab  08/25/18 1649 08/26/18 0103 08/26/18 0732 08/26/18 1150 08/26/18 1700  GLUCAP 193* 366* 246* 316* 229*   Lipid Profile: Recent Labs    08/24/18 0930  CHOL 116  HDL 31*  LDLCALC 51  TRIG 171*  CHOLHDL 3.7   Urine analysis:    Component Value Date/Time   COLORURINE YELLOW 08/24/2018 0856   APPEARANCEUR CLOUDY (A) 08/24/2018 0856   LABSPEC 1.012 08/24/2018 0856   PHURINE 5.0 08/24/2018 0856   GLUCOSEU NEGATIVE 08/24/2018 0856   HGBUR LARGE (A) 08/24/2018 0856   BILIRUBINUR NEGATIVE 08/24/2018 0856   KETONESUR NEGATIVE 08/24/2018 0856   PROTEINUR 100 (A) 08/24/2018 0856   NITRITE NEGATIVE 08/24/2018 0856   LEUKOCYTESUR LARGE (A) 08/24/2018 0856     Vernell Leep, MD, FACP, [facility]. [facility] Pager [phone]  If 7PM-7AM, please contact night-coverage [email or web address] Password [facility] 08/26/2018, 7:54 PM

## 2018-08-26 NOTE — Progress Notes (Signed)
CARDIAC REHAB PHASE I   Attempted to do pre-op education. No one at bedside to translate. Pts husband requests we come back when son is here. Left note for son we will try to come back tomorrow morning around 10a to see. IS, [facility] care guide, in-the-tube sheet and Cardiac surgery book left at bedside. Will continue to follow.  [id number] Rufina Falco, RN BSN 08/26/2018 2:58 PM

## 2018-08-26 NOTE — Progress Notes (Signed)
Pre CABG eval   Right Carotid:Velocities in the right ICA are consistent with a 40-59% stenosis.  Left Carotid: Velocities in the left ICA are consistent with a 40-59% stenosis.    Right Upper Extremity: Doppler waveforms decrease 50% with right radial compression. Doppler waveforms remain within normal limits with right ulnar compression.  Left Upper Extremity: Doppler waveforms remain within normal limits with left radial compression. Doppler waveforms remain within normal limits with left ulnar compression.   ABI done 08/22/18 WNL.   Landry Mellow, RDMS, RVT

## 2018-08-26 NOTE — Progress Notes (Signed)
Pt have several PVC's and runs of vent bigeminy. Cardiology pa made aware. New order received. Will cont to monitor pt.

## 2018-08-26 NOTE — Progress Notes (Addendum)
Progress Note  Patient Name: Joy Boyd Date of Encounter: 08/26/2018  Primary Cardiologist: Mertie Moores, MD   Subjective   No chest pain but complains of headache. No dyspnea. No weakness or slurred speech. Symptoms different than presentation.   Inpatient Medications    Scheduled Meds: . aspirin EC  81 mg Oral Daily  . atorvastatin  80 mg Oral Daily  . budesonide  0.25 mg Inhalation BID  . carvedilol  12.5 mg Oral BID WC  . guaiFENesin  600 mg Oral BID  . insulin aspart  0-9 Units Subcutaneous TID WC  . insulin glargine  28 Units Subcutaneous QHS  . isosorbide-hydrALAZINE  1 tablet Oral TID  . pantoprazole  40 mg Oral BID AC  . sodium chloride flush  3 mL Intravenous Q12H   Continuous Infusions: . sodium chloride    . cefTRIAXone (ROCEPHIN)  IV Stopped (08/25/18 2213)  . heparin 950 Units/hr (08/26/18 0630)   PRN Meds: sodium chloride, acetaminophen, alum & mag hydroxide-simeth, diphenhydrAMINE-zinc acetate, guaiFENesin-dextromethorphan, hydrALAZINE, ipratropium-albuterol, nitroGLYCERIN, ondansetron (ZOFRAN) IV, sodium chloride flush   Vital Signs    Vitals:   08/25/18 2117 08/26/18 0100 08/26/18 0500 08/26/18 0734  BP:  (!) 126/97 131/86 (!) 146/70  Pulse:  69 67 60  Resp:  (!) 24 18 16   Temp:  98.2 F (36.8 C) 98.4 F (36.9 C) 97.8 F (36.6 C)  TempSrc:  Oral Oral Oral  SpO2: 97% 98% 99% 98%  Weight:      Height:        Intake/Output Summary (Last 24 hours) at 08/26/2018 0959 Last data filed at 08/26/2018 0630 Gross per 24 hour  Intake 522.44 ml  Output 500 ml  Net 22.44 ml   Filed Weights   08/22/18 1600 08/22/18 2133 08/23/18 0621  Weight: 72.6 kg 70.2 kg 70.2 kg    Telemetry    Sinus rhythm with - Personally Reviewed  ECG    N/A  Physical Exam   GEN: No acute distress.   Neck: No JVD Cardiac: RRR, no murmurs, rubs, or gallops.  Respiratory: Clear to auscultation bilaterally. GI: Soft, nontender, non-distended  MS: No edema; No  deformity. Neuro:  Nonfocal  Psych: Normal affect   Labs    Chemistry Recent Labs  Lab 08/24/18 0753 08/24/18 1000 08/25/18 0217 08/26/18 0449  NA 140  --  135 136  K 4.9  --  4.8 4.5  CL 117*  --  112* 113*  CO2 15*  --  18* 17*  GLUCOSE 160*  --  285* 290*  BUN 31*  --  29* 28*  CREATININE 1.57*  --  1.49* 1.55*  CALCIUM 8.9  --  8.5* 8.7*  PROT  --  6.2*  --   --   ALBUMIN  --  2.9*  --   --   AST  --  13*  --   --   ALT  --  14  --   --   ALKPHOS  --  93  --   --   BILITOT  --  0.4  --   --   GFRNONAA 34*  --  36* 34*  GFRAA 39*  --  41* 39*  ANIONGAP 8  --  5 6     Hematology Recent Labs  Lab 08/24/18 0753 08/25/18 0217 08/26/18 0449  WBC 10.2 10.7* 9.0  RBC 4.64 4.06 3.71*  HGB 12.2 10.5* 9.8*  HCT 40.3 35.5* 31.9*  MCV 86.9 87.4 86.0  MCH 26.3 25.9* 26.4  MCHC 30.3 29.6* 30.7  RDW 14.3 14.3 14.4  PLT 150 155 151    Cardiac Enzymes Recent Labs  Lab 08/21/18 0412 08/21/18 1127 08/21/18 1822 08/22/18 0132  TROPONINI <0.03 <0.03 <0.03 <0.03    Recent Labs  Lab 08/20/18 2209  TROPIPOC 0.02     BNP Recent Labs  Lab 08/21/18 0358  BNP 532.8*     DDimer  Recent Labs  Lab 08/21/18 1127  DDIMER 1.07*     Radiology    No results found.  Cardiac Studies   Myoview 08/22/2018:  IMPRESSION: 1. Suspected areas of pharmacologically induced ischemia involving the lateral wall of the left ventricle and septum. 2. No definitive scintigraphic evidence of prior infarction. 3. Moderate left ventricular dilatation. 4. Severe left ventricular global hypokinesia with relative akinesia of the septum. Ejection fraction - 24%  Echocardiogram 08/21/2018:   Study Conclusions - Left ventricle: The cavity size was at the upper limits of normal. Wall thickness was increased in a pattern of mild LVH. Systolic function was severely reduced. The estimated ejection fraction was in the range of 15% to 20%. Diffuse hypokinesis. Features  are consistent with a pseudonormal left ventricular filling pattern, with concomitant abnormal relaxation and increased filling pressure (grade 2 diastolic dysfunction). - Mitral valve: There was mild regurgitation. Valve area by pressure half-time: 1.95 cm^2. - Left atrium: The atrium was moderately dilated. - Right ventricle: Systolic function was mildly reduced. - Right atrium: The atrium was mildly to moderately dilated. - Pulmonary arteries: PA peak pressure: 34 mm Hg (S). - Impressions: There is sevre global LV dysfunction in the setting of very frequent PVCs. 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  Impressions: - There is sevre global LV dysfunction in the setting of very frequent PVCs. 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  LHC 08/23/2018:   Prox RCA lesion is 40% stenosed.  Previously placed Prox RCA to Mid RCA stent is 5% stenosed. Mid RCA to Dist RCA stent (unknown type) is widely patent.  Dist RCA lesion is 40% stenosed. Post Atrio lesion is 65% stenosed.  Mid LM to Ost LAD lesion is 85% stenosed with 75% stenosed side branch in Ost Cx.  Previously placed Prox LAD to Mid LAD stent (unknown type) is widely patent. Mid LAD stent is 15% stenosed.  Mid LAD to Dist LAD lesion is 45% stenosed.  Previously placed Prox Cx to Mid Cx stent is 5% stenosed.  LV end diastolic pressure is moderately elevated.  SUMMARY  Severe distal left main disease involving the ostium of both LAD and circumflex.  Mild in-stent restenosis in the LAD, circumflex and RCA stents with moderate disease beyond the stent in the LAD.  Echocardiographically severe Cardiomyopathy with Ejection Fraction of roughly 20%.   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   RIGHT HEART CATH  08/25/18  Conclusion     Hemodynamic findings consistent with mild pulmonary hypertension. Recorded pulmonary artery pressure 42 over 17  mmHg. Mean PA pressure 31 mmHg  Mean pulmonary capillary wedge pressure 19 mmHg.  Cardiac output 4.8 L/min; cardiac index 2.82 L/min/m      Patient Profile     67 y.o. female with PMH of CAD s/p PCI to RCA, LCx, and LAD (most recent one in 01/2018), HTN, uncontrolled T2DM, and HLD who presented with one month of productive cough and 1 week of atypical chest pain.   Assessment & Plan    1. CAD s/p PCI  to RCA, LCx, and LAD (most recent one to RCA in 01/2018) with angina: Ms. Ohlrich presented with a week of atypical chest pain. Given her cardiac history, stress test was perfoemed showing ischemia, global hypokinesis, and reduced EF. LHC was performed and showed severe LM disease involving the ostium of LAD and LCx; as well as mild in-stent restenosis in all her stents with moderate disease beyond the stent in the LAD. Echo showed EF of 15% with diffuse hypokinesis, G2DD, and reduced RV function. Seen by CTCS and tentative CABG on Monday. - Not a candidate for ACEi/ARB/ARNi due to renal insufficiency.  - Appreciate TCTS assistance with this patient. - ASA 81mg  Daily (Plavix held for possible CABG) - Heparin gtt - Changed Bisoprolol to Coreg 12.5mg  BID >>> BP still elevated >> increase to 25mg  BID - Changed Amlodipine to BiDil  20-37.5mg  TID - Continue Lipitor 80mg  QD  2. 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 New Ischemic changes as above. BNP 532. CXR without pulmonary edema.  - Echocardiogram showed EF 15% with global hypokinesis, G2DD, and reduced RV function. This is reduced from 05/2018 when EF was 45%. She appears euvolemic on exam. - RHC yesterday showed Mean pulmonary capillary wedge pressure 19 mmHg. Cardiac output 4.8 L/min; cardiac index 2.82 L/min/m - Continue BiDil  20-37.5mg  TID. Increase to Coreg 25mg  BID   3. HTN: Elevated. Improving. Medication changes as above.    4. CKD Stage II/III: No previous  data in chart. Baseline appears to be 1.5. Scr stable.   5. Uncontrolled, insulin-dependent T2DM: A1c 9.3. On NPH and SSI. Per primary.  6. HLD: 08/24/2018: Cholesterol 116; HDL 31; LDL Cholesterol 51; Triglycerides 171; VLDL 34  - Atorvastatin Increased to 80mg  Daily  7. Dysuria: UA Suspicious for UTI, on Antibiotics per primary.  8. Headache: occurred after morning meds yesterday AM and today. Likely due to BiDil . Trial of Tylenol. If no improvement, will change only to hydralazine. No slurred speech or weakness.     For questions or updates, please contact [place] Please consult [email or web address] for contact info under        Signed, Leanor Kail, PA  08/26/2018, 9:59 AM    I have personally seen and examined this patient. I agree with the assessment and plan as outlined above.  She is stable this am. Awaiting CABG Monday. Will change Bidil to hydralazine only due to headache. Coreg will be increased today due to PVCs and HTN.   Lauree Chandler 08/26/2018 10:40 AM

## 2018-08-26 NOTE — Progress Notes (Signed)
ANTICOAGULATION CONSULT NOTE - Follow Up Consult  Pharmacy Consult for Heparin Indication: multiple stent restenosis  No Known Allergies  Patient Measurements: Height: 5\' 1"  (154.9 cm) Weight: 154 lb 12.8 oz (70.2 kg) IBW/kg (Calculated) : 47.8 Heparin Dosing Weight: 62.9  Vital Signs: Temp: 97.8 F (36.6 C) (11/01 0734) Temp Source: Oral (11/01 0734) BP: 146/70 (11/01 0734) Pulse Rate: 60 (11/01 0734)  Labs: Recent Labs    08/24/18 0753 08/24/18 1547 08/25/18 0217 08/26/18 0449 08/26/18 0750  HGB 12.2  --  10.5* 9.8*  --   HCT 40.3  --  35.5* 31.9*  --   PLT 150  --  155 151  --   HEPARINUNFRC 0.29* 0.28* 0.71*  --  0.50  CREATININE 1.57*  --  1.49* 1.55*  --     Estimated Creatinine Clearance: 32.4 mL/min (A) (by C-G formula based on SCr of 1.55 mg/dL (H)).  Assessment: [name] admitted with CP, SOB, and hx of CAD s/p multiple stents that have restenosed. Pt on heparin while pt decides decision regarding CABG v PCI. Tentative plans for CABG on Monday 11/4. -heparin level at goal on 950 units/hr   Goal of Therapy:  Heparin level 0.3-0.7 units/ml Monitor platelets by anticoagulation protocol: Yes   Plan:  --No heparin changes needed -Daily heparin level and CBC  Hildred Laser, PharmD Clinical Pharmacist Please check Amion for pharmacy contact number

## 2018-08-27 LAB — GLUCOSE, CAPILLARY
GLUCOSE-CAPILLARY: 146 mg/dL — AB (ref 70–99)
GLUCOSE-CAPILLARY: 265 mg/dL — AB (ref 70–99)
GLUCOSE-CAPILLARY: 191 mg/dL — AB (ref 70–99)
GLUCOSE-CAPILLARY: 117 mg/dL — AB (ref 70–99)

## 2018-08-27 LAB — CBC
HCT: 33.5 % — ABNORMAL LOW (ref 36.0–46.0)
HEMOGLOBIN: 10 g/dL — AB (ref 12.0–15.0)
MCH: 25.6 pg — ABNORMAL LOW (ref 26.0–34.0)
MCHC: 29.9 g/dL — ABNORMAL LOW (ref 30.0–36.0)
MCV: 85.7 fL (ref 80.0–100.0)
Platelets: 141 10*3/uL — ABNORMAL LOW (ref 150–400)
RBC: 3.91 MIL/uL (ref 3.87–5.11)
RDW: 14.5 % (ref 11.5–15.5)
WBC: 7.9 10*3/uL (ref 4.0–10.5)
nRBC: 0 % (ref 0.0–0.2)

## 2018-08-27 LAB — HEPARIN LEVEL (UNFRACTIONATED): Heparin Unfractionated: 0.62 IU/mL (ref 0.30–0.70)

## 2018-08-27 MED ORDER — INSULIN ASPART 100 UNIT/ML ~~LOC~~ SOLN
4.0000 [IU] | Freq: Three times a day (TID) | SUBCUTANEOUS | Status: DC
  Administered 2018-08-27 – 2018-08-28 (×2): 4 [IU] via SUBCUTANEOUS

## 2018-08-27 NOTE — Progress Notes (Signed)
[facility]                                                                              Patient Demographics  Joy Boyd, is a 67 y.o. female, DOB - 1950/11/22, WYO:[id number]  Admit date - 08/20/2018   Admitting Physician Colbert Ewing, MD  Outpatient Primary MD for the patient is Patient, No Pcp Per  Outpatient specialists:   LOS - 5  days   Medical records reviewed and are as summarized below:   Chief Complaint  Patient presents with  . Chest Pain  . Cough       Brief summary    Briefly 67 year old female, visiting her son and daughter-in-law from [place], came a month ago, with history of hypertension, multivessel CAD with recent stents to RCA, left circumflex and LAD,  cath in April 2019 presented with increasing dyspnea, productive cough for 1 months, atypical chest pain, at times pleuritic and radiating to the left arm.  She was admitted for unstable angina.  Cardiology was consulted, she underwent ischemic work-up including abnormal stress test and left heart cath. TCTS consulted and considering CABG.  Cardiology plans right heart cath on 10/31.  Course complicated by acute lower UTI.  Assessment & Plan    Principal Problem:    Unstable angina [facility]): In the setting of recent stents, multivessel CAD, acute bronchitis, GERD - d-dimer was elevated, CT angiogram of the chest negative for PE  - 2D echo showed EF of 15 to 20%, global hypokinesis, per son at the bedside EF was 45% in April 2019  - Patient underwent stress test which showed pharmacologically induced ischemia in the lateral wall of left ventricle and septum, moderate left ventricular dilatation, severe left ventricular global hypokinesia with relative akinesia of the septum, EF 24%. - Continue aspirin, atorvastatin, bisoprolol changed to carvedilol and increased to 25 mg twice daily, amlodipine discontinued, did not tolerate BiDil, started hydralazine and IV heparin.  Plavix held 10/29 and  kept on hold for tentative CABG 11/3. - Cardiac cath 10/29 showed severe stenosis in the distal left main artery involving the ostium of the LAD and circumflex.  Severe restenosis of distal RCA stent.  Cardiology felt that CABG was her best revascularization option.  - TCTS input appreciated and are considering CABG on 11/4.  [place] 10/31 showed mean pulmonary capillary wedge pressure 19 mmHg, cardiac output 4.8 L/min and cardiac index 2.82.  Cardiology has discussed with patient and family and advised that CABG would be the preferred option over high risk PCI. - No recurrence of chest pain.  Stable.  Active problems Acute bronchitis Improving, continue nebs, Zithromax, Robitussin, Mucinex.  Resolved.  GERD -Continue PPI, Maalox as needed.  Continue Protonix 40 mg twice daily.  Uncontrolled diabetes mellitus with hyperglycemia, type II, insulin-dependent Hemoglobin A1c 9.3 Diabetes coordinator assisting with management.  Had hypoglycemic episode/CBG 48 on 10/29 afternoon, possibly related to poor oral intake for cath, receiving insulins later than supposed to.   Diabetes coordinator has seen this admission.  Monitoring CBGs closely and adjusting insulins as needed.  Currently on Lantus 35 units nightly, NovoLog moderate SSI.  NovoLog  mealtime 4 units added.  Monitor closely.  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  Mild acidosis/bicarbonate 17-18.  Anion gap normal.  Follow BMP closely.  Essential hypertension Bisoprolol changed to carvedilol 12.5 mg twice daily, BiDil discontinued and hydralazine initiated.  Blood pressures better.  Hyperlipidemia Continue atorvastatin.  Chronic combined systolic and diastolic CHF: Cardiomyopathy possibly due to ischemia and a component of PVC induced CM.  Now on bisoprolol and hydralazine.  Clinically euvolemic.  E. coli acute  cystitis. Reported on 10/30.  Urine microscopy suspicious for UTI.  Completed 3 days of IV ceftriaxone, completed course, discontinued.   Code Status: Full code DVT Prophylaxis: Currently on IV heparin infusion. Family Communication: Discussed in detail with patient's son at bedside.  Disposition Plan: To be determined pending further evaluation and management.  Time Spent in minutes   35 minutes  Procedures:  2D echo Nuclear medicine stress test; results as above Cardiac cath 10/29  Consultants:   Cardiology TCTS  Antimicrobials:  Zithromax IV ceftriaxone 10/30 > 11/1  Medications  Scheduled Meds: . aspirin EC  81 mg Oral Daily  . atorvastatin  80 mg Oral Daily  . budesonide  0.25 mg Inhalation BID  . carvedilol  25 mg Oral BID WC  . guaiFENesin  600 mg Oral BID  . [START ON 08/29/2018] heparin-papaverine-plasmalyte irrigation   Irrigation To OR  . hydrALAZINE  25 mg Oral Q8H  . insulin aspart  0-15 Units Subcutaneous TID WC  . insulin aspart  0-5 Units Subcutaneous QHS  . insulin aspart  4 Units Subcutaneous TID WC  . insulin glargine  35 Units Subcutaneous QHS  . [START ON 08/29/2018] insulin   Intravenous To OR  . [START ON 08/29/2018] magnesium sulfate  40 mEq Other To OR  . pantoprazole  40 mg Oral BID AC  . [START ON 08/29/2018] phenylephrine  30-200 mcg/min Intravenous To OR  . [START ON 08/29/2018] potassium chloride  80 mEq Other To OR  . sodium chloride flush  3 mL Intravenous Q12H  . [START ON 08/29/2018] tranexamic acid  15 mg/kg Intravenous To OR  . [START ON 08/29/2018] tranexamic acid  2 mg/kg Intracatheter To OR   Continuous Infusions: . sodium chloride    . cefTRIAXone (ROCEPHIN)  IV 1 g (08/26/18 1735)  . [START ON 08/29/2018] cefUROXime (ZINACEF)  IV    . [START ON 08/29/2018] cefUROXime (ZINACEF)  IV    . [START ON 08/29/2018] dexmedetomidine    . [START ON 08/29/2018] DOPamine    . [START ON 08/29/2018] epinephrine    . [START ON 08/29/2018] heparin  30,000 units/NS 1000 mL solution for CELLSAVER    . heparin 950 Units/hr (08/26/18 2232)  . [START ON 08/29/2018] milrinone    . [START ON 08/29/2018] nitroGLYCERIN    . [START ON 08/29/2018] norepinephrine (LEVOPHED) Adult infusion    . [START ON 08/29/2018] tranexamic acid (CYKLOKAPRON) infusion ([facility])    . [START ON 08/29/2018] vancomycin     PRN Meds:.sodium chloride, acetaminophen, alum & mag hydroxide-simeth, diphenhydrAMINE-zinc acetate, guaiFENesin-dextromethorphan, hydrALAZINE, ipratropium-albuterol, nitroGLYCERIN, ondansetron (ZOFRAN) IV, sodium chloride flush   Antibiotics   Anti-infectives (From admission, onward)   Start     Dose/Rate Route Frequency Ordered Stop   08/29/18 0400  vancomycin (VANCOCIN) 1,250 mg in sodium chloride 0.9 % 250 mL IVPB     1,250 mg 166.7 mL/hr over 90 Minutes Intravenous To Surgery 08/26/18  1031 08/30/18 0400   08/29/18 0400  cefUROXime (ZINACEF) 1.5 g in sodium chloride 0.9 % 100 mL IVPB     1.5 g 200 mL/hr over 30 Minutes Intravenous To Surgery 08/26/18 1031 08/30/18 0400   08/29/18 0400  cefUROXime (ZINACEF) 750 mg in sodium chloride 0.9 % 100 mL IVPB     750 mg 200 mL/hr over 30 Minutes Intravenous To Surgery 08/26/18 1031 08/30/18 0400   08/24/18 1730  cefTRIAXone (ROCEPHIN) 1 g in sodium chloride 0.9 % 100 mL IVPB     1 g 200 mL/hr over 30 Minutes Intravenous Every 24 hours 08/24/18 1717     08/22/18 1000  azithromycin (ZITHROMAX) tablet 250 mg     250 mg Oral Daily 08/21/18 0405 08/25/18 0935   08/21/18 0500  azithromycin (ZITHROMAX) tablet 500 mg     500 mg Oral Daily 08/21/18 0405 08/21/18 0606        Subjective:   Feels much better.  Has periods of anxiety.  No chest pain.  No further dysuria or blood in urine.  No headache.  Objective:   Vitals:   08/27/18 0551 08/27/18 0744 08/27/18 0800 08/27/18 1131  BP: (!) 168/79 (!) 155/53  (!) 155/50  Pulse: 81 (!) 35 72 (!) 36  Resp:   16   Temp: 98.2 F (36.8 C) 98 F (36.7 C)   97.7 F (36.5 C)  TempSrc: Oral Oral  Oral  SpO2: 99% 98% 100% 99%  Weight: 68.5 kg     Height:        Intake/Output Summary (Last 24 hours) at 08/27/2018 1219 Last data filed at 08/26/2018 1500 Gross per 24 hour  Intake 240 ml  Output -  Net 240 ml     Wt Readings from Last 3 Encounters:  08/27/18 68.5 kg     Exam  General: Pleasant middle-aged female, moderately built and overweight lying comfortably propped up in bed.  Stable  Cardiovascular: S1 S2 auscultated, Regular rate and rhythm.  No JVD, murmurs or pedal edema.  Telemetry personally reviewed: Sinus rhythm, few bigemini and PVCs but less compared to before.  Respiratory: Clear to auscultation bilaterally, no wheezing, rales or rhonchi.  No increased work of breathing.  Stable  Gastrointestinal: Nondistended and soft.  Mild epigastric tenderness without rigidity, guarding or rebound.  No organomegaly or masses appreciated.  Normal bowel sounds heard.  Stable  Ext: Symmetric 5 x 5 power.  Mild bruising at right cubital fossa, possibly at site of IV access but no rash.  Neuro: Alert and oriented x3.  No focal neurological deficits.  Stable  Psych: Normal affect and demeanor.   Data Reviewed:  I have personally reviewed following labs and imaging studies  Micro Results Recent Results (from the past 240 hour(s))  Culture, Urine     Status: Abnormal   Collection Time: 08/24/18  8:56 AM  Result Value Ref Range Status   Specimen Description URINE, RANDOM  Final   Special Requests   Final    NONE Performed at [facility], [address] [place], [place] [address]    Culture >=100,000 COLONIES/mL ESCHERICHIA COLI (A)  Final   Report Status 08/26/2018 FINAL  Final   Organism ID, Bacteria ESCHERICHIA COLI (A)  Final      Susceptibility   Escherichia coli - MIC*    AMPICILLIN <=2 SENSITIVE Sensitive     CEFAZOLIN <=4 SENSITIVE Sensitive     CEFTRIAXONE <=1 SENSITIVE Sensitive     CIPROFLOXACIN <=  0.25  SENSITIVE Sensitive     GENTAMICIN <=1 SENSITIVE Sensitive     IMIPENEM <=0.25 SENSITIVE Sensitive     NITROFURANTOIN <=16 SENSITIVE Sensitive     TRIMETH/SULFA <=20 SENSITIVE Sensitive     AMPICILLIN/SULBACTAM <=2 SENSITIVE Sensitive     PIP/TAZO <=4 SENSITIVE Sensitive     Extended ESBL NEGATIVE Sensitive     * >=100,000 COLONIES/mL ESCHERICHIA COLI  MRSA PCR Screening     Status: None   Collection Time: 08/25/18  2:55 PM  Result Value Ref Range Status   MRSA by PCR NEGATIVE NEGATIVE Final    Comment:        The GeneXpert MRSA Assay (FDA approved for NASAL specimens only), is one component of a comprehensive MRSA colonization surveillance program. It is not intended to diagnose MRSA infection nor to guide or monitor treatment for MRSA infections. Performed at [facility], [place] [address], [place], [place] [address]     Radiology Reports Dg Chest 2 View  Result Date: 08/20/2018 CLINICAL DATA:  Cough and chest pain EXAM: CHEST - 2 VIEW COMPARISON:  None. FINDINGS: Lungs are clear. Heart size and pulmonary vascularity are normal. No adenopathy. There is aortic atherosclerosis. There are multiple foci of coronary artery calcification. No bone lesions. IMPRESSION: Multifocal coronary artery calcification. Aortic atherosclerosis. No edema or consolidation. Heart size normal. Electronically Signed   By: Lowella Grip III M.D.   On: 08/20/2018 23:09   Ct Angio Chest Pe W Or Wo Contrast  Result Date: 08/21/2018 CLINICAL DATA:  Increased dyspnea and chest pain over the past month. Pt also having productive cough. Elevated d-dimer. Symptoms began after a long plane ride from [place] to here to visit her son. EXAM: CT ANGIOGRAPHY CHEST WITH CONTRAST TECHNIQUE: Multidetector CT imaging of the chest was performed using the standard protocol during bolus administration of intravenous contrast. Multiplanar CT image reconstructions and MIPs were obtained to evaluate the vascular  anatomy. CONTRAST:  56mL ISOVUE-370 IOPAMIDOL (ISOVUE-370) INJECTION 76% COMPARISON:  Chest radiographs, 08/20/2018. FINDINGS: Cardiovascular: There is satisfactory opacification of the pulmonary arteries to the segmental level. There is no evidence of a pulmonary embolism. Heart is mildly enlarged. Three-vessel coronary artery calcifications. No pericardial effusion. Mild aortic atherosclerotic calcifications. Mediastinum/Nodes: No neck base, axillary, mediastinal or hilar masses or enlarged lymph nodes. Trachea and esophagus are unremarkable. Lungs/Pleura: No evidence of pneumonia or pulmonary edema. Mild bronchial wall thickening in the lower lobes. Mild dependent subsegmental atelectasis most evident at the left posterolateral lung base. No lung mass or suspicious nodule. No pleural effusion or pneumothorax. Upper Abdomen: No acute abnormality. Musculoskeletal: No chest wall abnormality. No acute or significant osseous findings. Review of the MIP images confirms the above findings. IMPRESSION: 1. No evidence of a pulmonary embolism. 2. Mild cardiomegaly. Coronary artery calcifications and aortic atherosclerosis. 3. No evidence of pneumonia or pulmonary edema. Mild bronchial wall thickening in the lower lobes. This is consistent with acute bronchial infection/inflammation given the history of productive cough. Aortic Atherosclerosis (ICD10-I70.0). Electronically Signed   By: Lajean Manes M.D.   On: 08/21/2018 17:02   Nm Myocar Multi W/spect W/wall Motion / Ef  Result Date: 08/22/2018 CLINICAL DATA:  Chest pain and shortness of breath. History of CAD post previous coronary stent placement. History of diabetes and hypertension. EXAM: MYOCARDIAL IMAGING WITH SPECT (REST AND PHARMACOLOGIC-STRESS) GATED LEFT VENTRICULAR WALL MOTION STUDY LEFT VENTRICULAR EJECTION FRACTION TECHNIQUE: Standard myocardial SPECT imaging was performed after resting intravenous injection of 10 mCi Tc-72m tetrofosmin.  Subsequently,  intravenous infusion of Lexiscan was performed under the supervision of the Cardiology staff. At peak effect of the drug, 30 mCi Tc-65m tetrofosmin was injected intravenously and standard myocardial SPECT imaging was performed. Quantitative gated imaging was also performed to evaluate left ventricular wall motion, and estimate left ventricular ejection fraction. COMPARISON:  Chest CT-08/21/2018 FINDINGS: Raw images: Mild GI attenuation is seen worse on the provided stress images. No significant breast or chest wall attenuation. No significant patient motion artifact. Perfusion: There is a moderate sized area of decreased perfusion involving the lateral wall of the left ventricle as well as a small area of decreased profusion involving the apical aspect of the septum on the provided stress images worrisome for areas of pharmacologically induced ischemia. No definitive matched areas of decreased perfusion to suggest prior infarction. Wall Motion: The left ventricle is moderately dilated. There is severe global hypokinesia with relative akinesia involving the septum. Left Ventricular Ejection Fraction: 24 % End diastolic volume 409 ml End systolic volume 96 ml IMPRESSION: 1. Suspected areas of pharmacologically induced ischemia involving the lateral wall of the left ventricle and septum. 2. No definitive scintigraphic evidence of prior infarction. 3. Moderate left ventricular dilatation. 4. Severe left ventricular global hypokinesia with relative akinesia of the septum. Ejection fraction - 24% Electronically Signed   By: Sandi Mariscal M.D.   On: 08/22/2018 14:27   Vas [place] Burnard Bunting With/wo Tbi  Result Date: 08/22/2018 LOWER EXTREMITY DOPPLER STUDY Indications: Claudication.  Performing Technologist: Carlos Levering RVT  Examination Guidelines: A complete evaluation includes at minimum, Doppler waveform signals and systolic blood pressure reading at the level of bilateral brachial, anterior tibial, and posterior tibial  arteries, when vessel segments are accessible. Bilateral testing is considered an integral part of a complete examination. Photoelectric Plethysmograph (PPG) waveforms and toe systolic pressure readings are included as required and additional duplex testing as needed. Limited examinations for reoccurring indications may be performed as noted.  ABI Findings: +--------+------------------+-----+---------+--------+ Right   Rt Pressure (mmHg)IndexWaveform Comment  +--------+------------------+-----+---------+--------+ Brachial130                    triphasic         +--------+------------------+-----+---------+--------+ PTA     157               1.05 triphasic         +--------+------------------+-----+---------+--------+ DP      143               0.95 triphasic         +--------+------------------+-----+---------+--------+ +--------+------------------+-----+---------+-------+ Left    Lt Pressure (mmHg)IndexWaveform Comment +--------+------------------+-----+---------+-------+ Brachial150                    triphasic        +--------+------------------+-----+---------+-------+ PTA     171               1.14 triphasic        +--------+------------------+-----+---------+-------+ DP      145               0.97 triphasic        +--------+------------------+-----+---------+-------+ +-------+-----------+-----------+------------+------------+ ABI/TBIToday's ABIToday's TBIPrevious ABIPrevious TBI +-------+-----------+-----------+------------+------------+ Right  1.05                                           +-------+-----------+-----------+------------+------------+ Left   1.14                                           +-------+-----------+-----------+------------+------------+  Summary: Right: Resting right ankle-brachial index is within normal range. No evidence of significant right lower extremity arterial disease. Left: Resting left ankle-brachial index is  within normal range. No evidence of significant left lower extremity arterial disease.  *See table(s) above for measurements and observations.  Electronically signed by Deitra Mayo MD on 08/22/2018 at 5:27:53 PM.   Final     Lab Data:  CBC: Recent Labs  Lab 08/23/18 0419 08/24/18 0753 08/25/18 0217 08/26/18 0449 08/27/18 0448  WBC 11.1* 10.2 10.7* 9.0 7.9  HGB 11.6* 12.2 10.5* 9.8* 10.0*  HCT 39.3 40.3 35.5* 31.9* 33.5*  MCV 87.1 86.9 87.4 86.0 85.7  PLT 165 150 155 151 892*   Basic Metabolic Panel: Recent Labs  Lab 08/22/18 0132 08/23/18 0419  08/23/18 1438 08/24/18 0126 08/24/18 0753 08/25/18 0217 08/26/18 0449  NA 134* 136   < > 137 140 140 135 136  K 5.3* 5.2*   < > 5.8* 4.6 4.9 4.8 4.5  CL 108 110   < > 113* 112* 117* 112* 113*  CO2 19* 17*   < > 17* 20* 15* 18* 17*  GLUCOSE 430* 260*   < > 240* 161* 160* 285* 290*  BUN 40* 44*   < > 41* 34* 31* 29* 28*  CREATININE 1.76* 1.85*   < > 1.63* 1.57* 1.57* 1.49* 1.55*  CALCIUM 8.7* 9.0   < > 8.9 9.1 8.9 8.5* 8.7*  MG 1.6* 1.6*  --   --   --  2.3 2.1 1.7   < > = values in this interval not displayed.   GFR: Estimated Creatinine Clearance: 32 mL/min (A) (by C-G formula based on SCr of 1.55 mg/dL (H)). Liver Function Tests: Recent Labs  Lab 08/24/18 1000  AST 13*  ALT 14  ALKPHOS 93  BILITOT 0.4  PROT 6.2*  ALBUMIN 2.9*   Cardiac Enzymes: Recent Labs  Lab 08/21/18 0412 08/21/18 1127 08/21/18 1822 08/22/18 0132  TROPONINI <0.03 <0.03 <0.03 <0.03   HbA1C: No results for input(s): HGBA1C in the last 72 hours. CBG: Recent Labs  Lab 08/26/18 1150 08/26/18 1700 08/26/18 2038 08/27/18 0741 08/27/18 1128  GLUCAP 316* 229* 137* 146* 265*   Lipid Profile: No results for input(s): CHOL, HDL, LDLCALC, TRIG, CHOLHDL, LDLDIRECT in the last 72 hours. Urine analysis:    Component Value Date/Time   COLORURINE YELLOW 08/24/2018 0856   APPEARANCEUR CLOUDY (A) 08/24/2018 0856   LABSPEC 1.012 08/24/2018  0856   PHURINE 5.0 08/24/2018 0856   GLUCOSEU NEGATIVE 08/24/2018 0856   HGBUR LARGE (A) 08/24/2018 0856   BILIRUBINUR NEGATIVE 08/24/2018 0856   KETONESUR NEGATIVE 08/24/2018 0856   PROTEINUR 100 (A) 08/24/2018 0856   NITRITE NEGATIVE 08/24/2018 0856   LEUKOCYTESUR LARGE (A) 08/24/2018 0856     Vernell Leep, MD, FACP, [facility]. [facility] Pager [phone]  If 7PM-7AM, please contact night-coverage [email or web address] Password [facility] 08/27/2018, 12:19 PM

## 2018-08-27 NOTE — Progress Notes (Signed)
ANTICOAGULATION CONSULT NOTE - Follow Up Consult  Pharmacy Consult for Heparin Indication: multiple stent restenosis  No Known Allergies  Patient Measurements: Height: 5\' 1"  (154.9 cm) Weight: 151 lb 1.6 oz (68.5 kg) IBW/kg (Calculated) : 47.8 Heparin Dosing Weight: 62.9  Vital Signs: Temp: 98.2 F (36.8 C) (11/02 0551) Temp Source: Oral (11/02 0551) BP: 168/79 (11/02 0551) Pulse Rate: 81 (11/02 0551)  Labs: Recent Labs    08/24/18 0753  08/25/18 0217 08/26/18 0449 08/26/18 0750 08/27/18 0448  HGB 12.2  --  10.5* 9.8*  --  10.0*  HCT 40.3  --  35.5* 31.9*  --  33.5*  PLT 150  --  155 151  --  141*  HEPARINUNFRC 0.29*   < > 0.71*  --  0.50 0.62  CREATININE 1.57*  --  1.49* 1.55*  --   --    < > = values in this interval not displayed.    Estimated Creatinine Clearance: 32 mL/min (A) (by C-G formula based on SCr of 1.55 mg/dL (H)).  Assessment: [name] admitted with CP, SOB, and hx of CAD s/p multiple stents that have restenosed. Pt on heparin while pt decides decision regarding CABG v PCI. Tentative plans for CABG on Monday 11/4.  Heparin level within goal range at 0.62 on 950 units/hr. CBC stable   Goal of Therapy:  Heparin level 0.3-0.7 units/ml Monitor platelets by anticoagulation protocol: Yes   Plan:  Continue heparin IV infusion at 950 units/hr Daily heparin level and CBC  Harrietta Guardian, PharmD PGY1 Pharmacy Resident 08/27/2018    7:34 AM  Please check Amion for pharmacy contact number

## 2018-08-27 NOTE — Progress Notes (Signed)
Progress Note  Patient Name: Joy Boyd Date of Encounter: 08/27/2018  Primary Cardiologist: Mertie Moores, MD   Subjective   No chest pain or SOB. Feeling well.  Inpatient Medications    Scheduled Meds: . aspirin EC  81 mg Oral Daily  . atorvastatin  80 mg Oral Daily  . budesonide  0.25 mg Inhalation BID  . carvedilol  25 mg Oral BID WC  . guaiFENesin  600 mg Oral BID  . [START ON 08/29/2018] heparin-papaverine-plasmalyte irrigation   Irrigation To OR  . hydrALAZINE  25 mg Oral Q8H  . insulin aspart  0-15 Units Subcutaneous TID WC  . insulin aspart  0-5 Units Subcutaneous QHS  . insulin glargine  35 Units Subcutaneous QHS  . [START ON 08/29/2018] insulin   Intravenous To OR  . [START ON 08/29/2018] magnesium sulfate  40 mEq Other To OR  . pantoprazole  40 mg Oral BID AC  . [START ON 08/29/2018] phenylephrine  30-200 mcg/min Intravenous To OR  . [START ON 08/29/2018] potassium chloride  80 mEq Other To OR  . sodium chloride flush  3 mL Intravenous Q12H  . [START ON 08/29/2018] tranexamic acid  15 mg/kg Intravenous To OR  . [START ON 08/29/2018] tranexamic acid  2 mg/kg Intracatheter To OR   Continuous Infusions: . sodium chloride    . cefTRIAXone (ROCEPHIN)  IV 1 g (08/26/18 1735)  . [START ON 08/29/2018] cefUROXime (ZINACEF)  IV    . [START ON 08/29/2018] cefUROXime (ZINACEF)  IV    . [START ON 08/29/2018] dexmedetomidine    . [START ON 08/29/2018] DOPamine    . [START ON 08/29/2018] epinephrine    . [START ON 08/29/2018] heparin 30,000 units/NS 1000 mL solution for CELLSAVER    . heparin 950 Units/hr (08/26/18 2232)  . [START ON 08/29/2018] milrinone    . [START ON 08/29/2018] nitroGLYCERIN    . [START ON 08/29/2018] norepinephrine (LEVOPHED) Adult infusion    . [START ON 08/29/2018] tranexamic acid (CYKLOKAPRON) infusion ([facility])    . [START ON 08/29/2018] vancomycin     PRN Meds: sodium chloride, acetaminophen, alum & mag hydroxide-simeth, diphenhydrAMINE-zinc acetate,  guaiFENesin-dextromethorphan, hydrALAZINE, ipratropium-albuterol, nitroGLYCERIN, ondansetron (ZOFRAN) IV, sodium chloride flush   Vital Signs    Vitals:   08/27/18 0551 08/27/18 0744 08/27/18 0800 08/27/18 1131  BP: (!) 168/79 (!) 155/53  (!) 155/50  Pulse: 81 (!) 35 72 (!) 36  Resp:   16   Temp: 98.2 F (36.8 C) 98 F (36.7 C)  97.7 F (36.5 C)  TempSrc: Oral Oral  Oral  SpO2: 99% 98% 100% 99%  Weight: 68.5 kg     Height:        Intake/Output Summary (Last 24 hours) at 08/27/2018 1206 Last data filed at 08/26/2018 1500 Gross per 24 hour  Intake 240 ml  Output -  Net 240 ml   Filed Weights   08/22/18 2133 08/23/18 0621 08/27/18 0551  Weight: 70.2 kg 70.2 kg 68.5 kg    Telemetry    SR, ventricular bigeminy - Personally Reviewed  ECG    N/A  Physical Exam   GEN: Well nourished, well developed, in no acute distress  HEENT: normal  Neck: no JVD, carotid bruits, or masses Cardiac: iRRR; no murmurs, rubs, or gallops,no edema  Respiratory:  clear to auscultation bilaterally, normal work of breathing GI: soft, nontender, nondistended, + BS MS: no deformity or atrophy  Skin: warm and dry Neuro:  Strength and sensation are intact Psych: euthymic  mood, full affect   Labs    Chemistry Recent Labs  Lab 08/24/18 0753 08/24/18 1000 08/25/18 0217 08/26/18 0449  NA 140  --  135 136  K 4.9  --  4.8 4.5  CL 117*  --  112* 113*  CO2 15*  --  18* 17*  GLUCOSE 160*  --  285* 290*  BUN 31*  --  29* 28*  CREATININE 1.57*  --  1.49* 1.55*  CALCIUM 8.9  --  8.5* 8.7*  PROT  --  6.2*  --   --   ALBUMIN  --  2.9*  --   --   AST  --  13*  --   --   ALT  --  14  --   --   ALKPHOS  --  93  --   --   BILITOT  --  0.4  --   --   GFRNONAA 34*  --  36* 34*  GFRAA 39*  --  41* 39*  ANIONGAP 8  --  5 6     Hematology Recent Labs  Lab 08/25/18 0217 08/26/18 0449 08/27/18 0448  WBC 10.7* 9.0 7.9  RBC 4.06 3.71* 3.91  HGB 10.5* 9.8* 10.0*  HCT 35.5* 31.9* 33.5*  MCV  87.4 86.0 85.7  MCH 25.9* 26.4 25.6*  MCHC 29.6* 30.7 29.9*  RDW 14.3 14.4 14.5  PLT 155 151 141*    Cardiac Enzymes Recent Labs  Lab 08/21/18 0412 08/21/18 1127 08/21/18 1822 08/22/18 0132  TROPONINI <0.03 <0.03 <0.03 <0.03    Recent Labs  Lab 08/20/18 2209  TROPIPOC 0.02     BNP Recent Labs  Lab 08/21/18 0358  BNP 532.8*     DDimer  Recent Labs  Lab 08/21/18 1127  DDIMER 1.07*     Radiology    No results found.  Cardiac Studies   Myoview 08/22/2018:  IMPRESSION: 1. Suspected areas of pharmacologically induced ischemia involving the lateral wall of the left ventricle and septum. 2. No definitive scintigraphic evidence of prior infarction. 3. Moderate left ventricular dilatation. 4. Severe left ventricular global hypokinesia with relative akinesia of the septum. Ejection fraction - 24%  Echocardiogram 08/21/2018:   Study Conclusions - Left ventricle: The cavity size was at the upper limits of normal. Wall thickness was increased in a pattern of mild LVH. Systolic function was severely reduced. The estimated ejection fraction was in the range of 15% to 20%. Diffuse hypokinesis. Features are consistent with a pseudonormal left ventricular filling pattern, with concomitant abnormal relaxation and increased filling pressure (grade 2 diastolic dysfunction). - Mitral valve: There was mild regurgitation. Valve area by pressure half-time: 1.95 cm^2. - Left atrium: The atrium was moderately dilated. - Right ventricle: Systolic function was mildly reduced. - Right atrium: The atrium was mildly to moderately dilated. - Pulmonary arteries: PA peak pressure: 34 mm Hg (S). - Impressions: There is sevre global LV dysfunction in the setting of very frequent PVCs. 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  Impressions: - There is sevre global LV dysfunction in the setting of very frequent PVCs. 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  LHC  08/23/2018:   Prox RCA lesion is 40% stenosed.  Previously placed Prox RCA to Mid RCA stent is 5% stenosed. Mid RCA to Dist RCA stent (unknown type) is widely patent.  Dist RCA lesion is 40% stenosed. Post Atrio lesion is 65% stenosed.  Mid LM to Ost LAD lesion is 85% stenosed with 75% stenosed side branch in Ost Cx.  Previously placed  Prox LAD to Mid LAD stent (unknown type) is widely patent. Mid LAD stent is 15% stenosed.  Mid LAD to Dist LAD lesion is 45% stenosed.  Previously placed Prox Cx to Mid Cx stent is 5% stenosed.  LV end diastolic pressure is moderately elevated.  SUMMARY  Severe distal left main disease involving the ostium of both LAD and circumflex.  Mild in-stent restenosis in the LAD, circumflex and RCA stents with moderate disease beyond the stent in the LAD.  Echocardiographically severe Cardiomyopathy with Ejection Fraction of roughly 20%.   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   RIGHT HEART CATH  08/25/18  Conclusion     Hemodynamic findings consistent with mild pulmonary hypertension. Recorded pulmonary artery pressure 42 over 17 mmHg. Mean PA pressure 31 mmHg  Mean pulmonary capillary wedge pressure 19 mmHg.  Cardiac output 4.8 L/min; cardiac index 2.82 L/min/m      Patient Profile     67 y.o. female with PMH of CAD s/p PCI to RCA, LCx, and LAD (most recent one in 01/2018), HTN, uncontrolled T2DM, and HLD who presented with one month of productive cough and 1 week of atypical chest pain.   Assessment & Plan    1. CAD s/p PCI to RCA, LCx, and LAD (most recent one to RCA in 01/2018) with angina: Plan for CABG Monday. No a candidate for ACE/ARB due to CKD. Continue coreg, bidil, lipitor.   2. 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 EF 15%. Continue BiDil and coreg.  3. HTN: elevated and improving. Limited titration due to renal  disease.   4. CKD Stage II/III: stable  5. Uncontrolled, insulin-dependent T2DM: per primary team  6. HLD: 08/24/2018: Cholesterol 116; HDL 31; LDL Cholesterol 51; Triglycerides 171; VLDL 34  Continue atorvastatin 7. Dysuria: antibiotics per primary team   For questions or updates, please contact [facility] Please consult [email or web address] for contact info under        Signed, Alessa Mazur Meredith Leeds, MD  08/27/2018, 12:06 PM    Cardiology to see as needed over the weekend.

## 2018-08-27 NOTE — Progress Notes (Signed)
Pts son and daughter came for preop education which he translated for pt (he signed waiver for education purposes). Discussed sternal precautions, mobility, IS (1200 mL), and d/c planning. She will have care at d/c then hopes to return to [place] in 6 weeks. Many questions asked. She was able to do bed mobility following sternal precautions. Highly encouraged IS. No amb due to LM and in bigeminy. [place] 10:55 AM 08/27/2018

## 2018-08-28 ENCOUNTER — Inpatient Hospital Stay (HOSPITAL_COMMUNITY): Payer: Medicaid Other

## 2018-08-28 DIAGNOSIS — I25110 Atherosclerotic heart disease of native coronary artery with unstable angina pectoris: Secondary | ICD-10-CM

## 2018-08-28 LAB — BLOOD GAS, ARTERIAL
Acid-base deficit: 7.4 mmol/L — ABNORMAL HIGH (ref 0.0–2.0)
Bicarbonate: 16.9 mmol/L — ABNORMAL LOW (ref 20.0–28.0)
Drawn by: 31394
O2 SAT: 96.7 %
Patient temperature: 98
pCO2 arterial: 30.2 mmHg — ABNORMAL LOW (ref 32.0–48.0)
pH, Arterial: 7.366 (ref 7.350–7.450)
pO2, Arterial: 98.4 mmHg (ref 83.0–108.0)

## 2018-08-28 LAB — GLUCOSE, CAPILLARY
GLUCOSE-CAPILLARY: 135 mg/dL — AB (ref 70–99)
Glucose-Capillary: 83 mg/dL (ref 70–99)
Glucose-Capillary: 187 mg/dL — ABNORMAL HIGH (ref 70–99)
Glucose-Capillary: 129 mg/dL — ABNORMAL HIGH (ref 70–99)

## 2018-08-28 LAB — BASIC METABOLIC PANEL
ANION GAP: 5 (ref 5–15)
BUN: 28 mg/dL — ABNORMAL HIGH (ref 8–23)
CALCIUM: 8.8 mg/dL — AB (ref 8.9–10.3)
CO2: 19 mmol/L — ABNORMAL LOW (ref 22–32)
CREATININE: 1.65 mg/dL — AB (ref 0.44–1.00)
Chloride: 116 mmol/L — ABNORMAL HIGH (ref 98–111)
GFR calc Af Amer: 37 mL/min — ABNORMAL LOW (ref >60)
GFR calc non Af Amer: 32 mL/min — ABNORMAL LOW (ref >60)
GLUCOSE: 98 mg/dL (ref 70–99)
Potassium: 4.5 mmol/L (ref 3.5–5.1)
Sodium: 140 mmol/L (ref 135–145)

## 2018-08-28 LAB — CBC
HCT: 32.5 % — ABNORMAL LOW (ref 36.0–46.0)
Hemoglobin: 10 g/dL — ABNORMAL LOW (ref 12.0–15.0)
MCH: 26.5 pg (ref 26.0–34.0)
MCHC: 30.8 g/dL (ref 30.0–36.0)
MCV: 86 fL (ref 80.0–100.0)
PLATELETS: 164 10*3/uL (ref 150–400)
RBC: 3.78 MIL/uL — ABNORMAL LOW (ref 3.87–5.11)
RDW: 14.5 % (ref 11.5–15.5)
WBC: 8.6 10*3/uL (ref 4.0–10.5)
nRBC: 0 % (ref 0.0–0.2)

## 2018-08-28 LAB — ABO/RH: ABO/RH(D): O POS

## 2018-08-28 LAB — HEPARIN LEVEL (UNFRACTIONATED): Heparin Unfractionated: 0.61 IU/mL (ref 0.30–0.70)

## 2018-08-28 LAB — PREPARE RBC (CROSSMATCH)

## 2018-08-28 MED ORDER — TEMAZEPAM 15 MG PO CAPS: 15.0000 mg | ORAL_CAPSULE | Freq: Once | ORAL | Status: DC | PRN

## 2018-08-28 MED ORDER — CHLORHEXIDINE GLUCONATE 0.12 % MT SOLN
15.0000 mL | Freq: Once | OROMUCOSAL | Status: AC
  Administered 2018-08-29: 15 mL via OROMUCOSAL
  Filled 2018-08-28: qty 15

## 2018-08-28 MED ORDER — METOPROLOL TARTRATE 12.5 MG HALF TABLET
12.5000 mg | ORAL_TABLET | Freq: Once | ORAL | Status: AC
  Administered 2018-08-29: 12.5 mg via ORAL
  Filled 2018-08-28: qty 1

## 2018-08-28 MED ORDER — DIAZEPAM 5 MG PO TABS
5.0000 mg | ORAL_TABLET | Freq: Once | ORAL | Status: AC
  Administered 2018-08-29: 5 mg via ORAL
  Filled 2018-08-28: qty 1

## 2018-08-28 MED ORDER — CHLORHEXIDINE GLUCONATE CLOTH 2 % EX PADS
6.0000 | MEDICATED_PAD | Freq: Once | CUTANEOUS | Status: AC
  Administered 2018-08-29 (×2): 6 via TOPICAL

## 2018-08-28 MED ORDER — CHLORHEXIDINE GLUCONATE CLOTH 2 % EX PADS: 6.0000 | MEDICATED_PAD | Freq: Once | CUTANEOUS | Status: AC

## 2018-08-28 NOTE — Progress Notes (Signed)
ANTICOAGULATION CONSULT NOTE - Follow Up Consult  Pharmacy Consult for Heparin Indication: multiple stent restenosis  No Known Allergies  Patient Measurements: Height: 5\' 1"  (154.9 cm) Weight: 153 lb 8 oz (69.6 kg) IBW/kg (Calculated) : 47.8 Heparin Dosing Weight: 62.9  Vital Signs: Temp: 97.4 F (36.3 C) (11/03 0827) Temp Source: Oral (11/03 0827) BP: 148/101 (11/03 0827) Pulse Rate: 143 (11/03 0827)  Labs: Recent Labs    08/26/18 0449 08/26/18 0750 08/27/18 0448 08/28/18 0347  HGB 9.8*  --  10.0* 10.0*  HCT 31.9*  --  33.5* 32.5*  PLT 151  --  141* 164  HEPARINUNFRC  --  0.50 0.62 0.61  CREATININE 1.55*  --   --  1.65*    Estimated Creatinine Clearance: 30.3 mL/min (A) (by C-G formula based on SCr of 1.65 mg/dL (H)).  Assessment: [name] admitted with CP, SOB, and hx of CAD s/p multiple stents that have restenosed. Pt on heparin withplans for CABG on Monday 11/4.  Heparin level within goal range at 0.61 on 950 units/hr. CBC stable   Goal of Therapy:  Heparin level 0.3-0.7 units/ml Monitor platelets by anticoagulation protocol: Yes   Plan:  Continue heparin IV infusion at 950 units/hr Daily heparin level and CBC CABG on 11/4  Hildred Laser, PharmD Clinical Pharmacist **Pharmacist phone directory can now be found on [email or web address] ([email or web address]).  Listed under [place].

## 2018-08-28 NOTE — Progress Notes (Signed)
[facility]                                                                              Patient Demographics  Joy Boyd, is a 67 y.o. female, DOB - 20-Jun-1951, ZES:[id number]  Admit date - 08/20/2018   Admitting Physician Colbert Ewing, MD  Outpatient Primary MD for the patient is Patient, No Pcp Per  Outpatient specialists:   LOS - 6  days   Medical records reviewed and are as summarized below:   Chief Complaint  Patient presents with  . Chest Pain  . Cough       Brief summary    Briefly 67 year old female, visiting her son and daughter-in-law from [place], came a month ago, with history of hypertension, multivessel CAD with recent stents to RCA, left circumflex and LAD,  cath in April 2019 presented with increasing dyspnea, productive cough for 1 months, atypical chest pain, at times pleuritic and radiating to the left arm.  She was admitted for unstable angina.  Cardiology was consulted, she underwent ischemic work-up including abnormal stress test and left heart cath. TCTS consulted and considering CABG.  Cardiology plans right heart cath on 10/31.  Course complicated by acute lower UTI.  Assessment & Plan    Principal Problem:    Unstable angina [facility]): In the setting of recent stents, multivessel CAD, acute bronchitis, GERD - d-dimer was elevated, CT angiogram of the chest negative for PE  - 2D echo showed EF of 15 to 20%, global hypokinesis, per son at the bedside EF was 45% in April 2019  - Patient underwent stress test which showed pharmacologically induced ischemia in the lateral wall of left ventricle and septum, moderate left ventricular dilatation, severe left ventricular global hypokinesia with relative akinesia of the septum, EF 24%. - Continue aspirin, atorvastatin, bisoprolol changed to carvedilol and increased to 25 mg twice daily, amlodipine discontinued, did not tolerate BiDil, started hydralazine and IV heparin.  Plavix held 10/29 and  kept on hold for tentative CABG 11/3. - Cardiac cath 10/29 showed severe stenosis in the distal left main artery involving the ostium of the LAD and circumflex.  Severe restenosis of distal RCA stent.  Cardiology felt that CABG was her best revascularization option.  - TCTS input appreciated and are considering CABG on 11/4.  [place]pton 10/31 showed mean pulmonary capillary wedge pressure 19 mmHg, cardiac output 4.8 L/min and cardiac index 2.82.  Cardiology has discussed with patient and family and advised that CABG would be the preferred option over high risk PCI. -No recurrence of chest pain for couple of days now.  Stable and awaiting CABG 11/4.  Active problems Acute bronchitis Improving, continue nebs, Zithromax, Robitussin, Mucinex.  Resolved.  GERD -Continue PPI, Maalox as needed.  Continue Protonix 40 mg twice daily.  Uncontrolled diabetes mellitus with hyperglycemia, type II, insulin-dependent Hemoglobin A1c 9.3 Diabetes coordinator assisting with management.  Had hypoglycemic episode/CBG 48 on 10/29 afternoon, possibly related to poor oral intake for cath, receiving insulins later than supposed to.   Diabetes coordinator has seen this admission.  Monitoring CBGs closely and adjusting insulins as needed.  Currently on Lantus  35 units nightly, NovoLog moderate SSI.  NovoLog mealtime 4 units added.  CBGs better as noted below.  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  Mild acidosis/bicarbonate 17-18.  Anion gap normal.  Creatinine has slightly worsened to 1.65.  Follow BMP in a.m.  Essential hypertension Bisoprolol changed to carvedilol 12.5 mg twice daily, BiDil discontinued and hydralazine initiated.  Blood pressures mildly uncontrolled and fluctuating.  Hyperlipidemia Continue atorvastatin.  Chronic combined systolic and diastolic CHF: Cardiomyopathy possibly due to  ischemia and a component of PVC induced CM.  Now on bisoprolol and hydralazine.  Clinically euvolemic.  E. coli acute cystitis. Reported on 10/30.  Urine microscopy suspicious for UTI.  Completed 3 days of IV ceftriaxone, completed course, discontinued.   Code Status: Full code DVT Prophylaxis: Currently on IV heparin infusion. Family Communication: Discussed in detail with patient's spouse and next son who arrived from [place] today.  Updated care and answered questions.  Disposition Plan: To be determined pending further evaluation and management.  Time Spent in minutes   35 minutes  Procedures:  2D echo Nuclear medicine stress test; results as above Cardiac cath 10/29  Consultants:   Cardiology TCTS  Antimicrobials:  Zithromax IV ceftriaxone 10/30 > 11/1  Medications  Scheduled Meds: . aspirin EC  81 mg Oral Daily  . atorvastatin  80 mg Oral Daily  . budesonide  0.25 mg Inhalation BID  . carvedilol  25 mg Oral BID WC  . guaiFENesin  600 mg Oral BID  . [START ON 08/29/2018] heparin-papaverine-plasmalyte irrigation   Irrigation To OR  . hydrALAZINE  25 mg Oral Q8H  . insulin aspart  0-15 Units Subcutaneous TID WC  . insulin aspart  0-5 Units Subcutaneous QHS  . insulin aspart  4 Units Subcutaneous TID WC  . insulin glargine  35 Units Subcutaneous QHS  . [START ON 08/29/2018] insulin   Intravenous To OR  . [START ON 08/29/2018] magnesium sulfate  40 mEq Other To OR  . pantoprazole  40 mg Oral BID AC  . [START ON 08/29/2018] phenylephrine  30-200 mcg/min Intravenous To OR  . [START ON 08/29/2018] potassium chloride  80 mEq Other To OR  . sodium chloride flush  3 mL Intravenous Q12H  . [START ON 08/29/2018] tranexamic acid  15 mg/kg Intravenous To OR  . [START ON 08/29/2018] tranexamic acid  2 mg/kg Intracatheter To OR   Continuous Infusions: . sodium chloride    . [START ON 08/29/2018] cefUROXime (ZINACEF)  IV    . [START ON 08/29/2018] cefUROXime (ZINACEF)  IV    . [START ON  08/29/2018] dexmedetomidine    . [START ON 08/29/2018] DOPamine    . [START ON 08/29/2018] epinephrine    . [START ON 08/29/2018] heparin 30,000 units/NS 1000 mL solution for CELLSAVER    . heparin 950 Units/hr (08/27/18 1500)  . [START ON 08/29/2018] milrinone    . [START ON 08/29/2018] nitroGLYCERIN    . [START ON 08/29/2018] norepinephrine (LEVOPHED) Adult infusion    . [START ON 08/29/2018] tranexamic acid (CYKLOKAPRON) infusion ([facility])    . [START ON 08/29/2018] vancomycin     PRN Meds:.sodium chloride, acetaminophen, alum & mag hydroxide-simeth, diphenhydrAMINE-zinc acetate, guaiFENesin-dextromethorphan, hydrALAZINE, ipratropium-albuterol, nitroGLYCERIN, ondansetron (ZOFRAN) IV, sodium chloride flush   Antibiotics   Anti-infectives (From admission, onward)   Start     Dose/Rate Route Frequency Ordered Stop   08/29/18 0400  vancomycin (VANCOCIN) 1,250  mg in sodium chloride 0.9 % 250 mL IVPB     1,250 mg 166.7 mL/hr over 90 Minutes Intravenous To Surgery 08/26/18 1031 08/30/18 0400   08/29/18 0400  cefUROXime (ZINACEF) 1.5 g in sodium chloride 0.9 % 100 mL IVPB     1.5 g 200 mL/hr over 30 Minutes Intravenous To Surgery 08/26/18 1031 08/30/18 0400   08/29/18 0400  cefUROXime (ZINACEF) 750 mg in sodium chloride 0.9 % 100 mL IVPB     750 mg 200 mL/hr over 30 Minutes Intravenous To Surgery 08/26/18 1031 08/30/18 0400   08/24/18 1730  cefTRIAXone (ROCEPHIN) 1 g in sodium chloride 0.9 % 100 mL IVPB  Status:  Discontinued     1 g 200 mL/hr over 30 Minutes Intravenous Every 24 hours 08/24/18 1717 08/27/18 1231   08/22/18 1000  azithromycin (ZITHROMAX) tablet 250 mg     250 mg Oral Daily 08/21/18 0405 08/25/18 0935   08/21/18 0500  azithromycin (ZITHROMAX) tablet 500 mg     500 mg Oral Daily 08/21/18 0405 08/21/18 0606        Subjective:   Patient son at bedside assisted with interpretation.  Patient denies complaints.  No chest pain.  Dysuria and blood in urine resolved without  recurrence.  Objective:   Vitals:   08/28/18 0042 08/28/18 0500 08/28/18 0827 08/28/18 0840  BP: 127/76 (!) 155/91 (!) 148/101   Pulse: 68 87 (!) 143   Resp:  16    Temp:  98.1 F (36.7 C) (!) 97.4 F (36.3 C)   TempSrc:  Oral Oral   SpO2: 98% 99% 99% 99%  Weight:  69.6 kg    Height:        Intake/Output Summary (Last 24 hours) at 08/28/2018 1233 Last data filed at 08/28/2018 0700 Gross per 24 hour  Intake 689.96 ml  Output -  Net 689.96 ml     Wt Readings from Last 3 Encounters:  08/28/18 69.6 kg     Exam: No significant change in clinical exam compared to yesterday.  General: Pleasant middle-aged female, moderately built and overweight lying comfortably propped up in bed.  Stable  Cardiovascular: S1 S2 auscultated, Regular rate and rhythm.  No JVD, murmurs or pedal edema.  Telemetry personally reviewed: Sinus rhythm with BBB morphology, frequent bigemini and PVCs.  Respiratory: Clear to auscultation bilaterally, no wheezing, rales or rhonchi.  No increased work of breathing.  Stable  Gastrointestinal: Nondistended and soft.  Mild epigastric tenderness without rigidity, guarding or rebound.  No organomegaly or masses appreciated.  Normal bowel sounds heard.  Stable  Ext: Symmetric 5 x 5 power.  Mild bruising at right cubital fossa, possibly at site of IV access but no rash.  Neuro: Alert and oriented x3.  No focal neurological deficits.  Stable  Psych: Normal affect and demeanor.   Data Reviewed:  I have personally reviewed following labs and imaging studies  Micro Results Recent Results (from the past 240 hour(s))  Culture, Urine     Status: Abnormal   Collection Time: 08/24/18  8:56 AM  Result Value Ref Range Status   Specimen Description URINE, RANDOM  Final   Special Requests   Final    NONE Performed at [facility], [address], [place], [place] [address]    Culture >=100,000 COLONIES/mL ESCHERICHIA COLI (A)  Final   Report Status 08/26/2018  FINAL  Final   Organism ID, Bacteria ESCHERICHIA COLI (A)  Final      Susceptibility  Escherichia coli - MIC*    AMPICILLIN <=2 SENSITIVE Sensitive     CEFAZOLIN <=4 SENSITIVE Sensitive     CEFTRIAXONE <=1 SENSITIVE Sensitive     CIPROFLOXACIN <=0.25 SENSITIVE Sensitive     GENTAMICIN <=1 SENSITIVE Sensitive     IMIPENEM <=0.25 SENSITIVE Sensitive     NITROFURANTOIN <=16 SENSITIVE Sensitive     TRIMETH/SULFA <=20 SENSITIVE Sensitive     AMPICILLIN/SULBACTAM <=2 SENSITIVE Sensitive     PIP/TAZO <=4 SENSITIVE Sensitive     Extended ESBL NEGATIVE Sensitive     * >=100,000 COLONIES/mL ESCHERICHIA COLI  MRSA PCR Screening     Status: None   Collection Time: 08/25/18  2:55 PM  Result Value Ref Range Status   MRSA by PCR NEGATIVE NEGATIVE Final    Comment:        The GeneXpert MRSA Assay (FDA approved for NASAL specimens only), is one component of a comprehensive MRSA colonization surveillance program. It is not intended to diagnose MRSA infection nor to guide or monitor treatment for MRSA infections. Performed at [facility], [place] [address], [place],  [address]     Radiology Reports Dg Chest 2 View  Result Date: 08/20/2018 CLINICAL DATA:  Cough and chest pain EXAM: CHEST - 2 VIEW COMPARISON:  None. FINDINGS: Lungs are clear. Heart size and pulmonary vascularity are normal. No adenopathy. There is aortic atherosclerosis. There are multiple foci of coronary artery calcification. No bone lesions. IMPRESSION: Multifocal coronary artery calcification. Aortic atherosclerosis. No edema or consolidation. Heart size normal. Electronically Signed   By: Lowella Grip III M.D.   On: 08/20/2018 23:09   Ct Angio Chest Pe W Or Wo Contrast  Result Date: 08/21/2018 CLINICAL DATA:  Increased dyspnea and chest pain over the past month. Pt also having productive cough. Elevated d-dimer. Symptoms began after a long plane ride from [place] to here to visit her son. EXAM: CT  ANGIOGRAPHY CHEST WITH CONTRAST TECHNIQUE: Multidetector CT imaging of the chest was performed using the standard protocol during bolus administration of intravenous contrast. Multiplanar CT image reconstructions and MIPs were obtained to evaluate the vascular anatomy. CONTRAST:  65mL ISOVUE-370 IOPAMIDOL (ISOVUE-370) INJECTION 76% COMPARISON:  Chest radiographs, 08/20/2018. FINDINGS: Cardiovascular: There is satisfactory opacification of the pulmonary arteries to the segmental level. There is no evidence of a pulmonary embolism. Heart is mildly enlarged. Three-vessel coronary artery calcifications. No pericardial effusion. Mild aortic atherosclerotic calcifications. Mediastinum/Nodes: No neck base, axillary, mediastinal or hilar masses or enlarged lymph nodes. Trachea and esophagus are unremarkable. Lungs/Pleura: No evidence of pneumonia or pulmonary edema. Mild bronchial wall thickening in the lower lobes. Mild dependent subsegmental atelectasis most evident at the left posterolateral lung base. No lung mass or suspicious nodule. No pleural effusion or pneumothorax. Upper Abdomen: No acute abnormality. Musculoskeletal: No chest wall abnormality. No acute or significant osseous findings. Review of the MIP images confirms the above findings. IMPRESSION: 1. No evidence of a pulmonary embolism. 2. Mild cardiomegaly. Coronary artery calcifications and aortic atherosclerosis. 3. No evidence of pneumonia or pulmonary edema. Mild bronchial wall thickening in the lower lobes. This is consistent with acute bronchial infection/inflammation given the history of productive cough. Aortic Atherosclerosis (ICD10-I70.0). Electronically Signed   By: Lajean Manes M.D.   On: 08/21/2018 17:02   Nm Myocar Multi W/spect W/wall Motion / Ef  Result Date: 08/22/2018 CLINICAL DATA:  Chest pain and shortness of breath. History of CAD post previous coronary stent placement. History of diabetes and hypertension. EXAM: MYOCARDIAL IMAGING  WITH SPECT (REST AND PHARMACOLOGIC-STRESS) GATED LEFT VENTRICULAR WALL MOTION STUDY LEFT VENTRICULAR EJECTION FRACTION TECHNIQUE: Standard myocardial SPECT imaging was performed after resting intravenous injection of 10 mCi Tc-32m tetrofosmin. Subsequently, intravenous infusion of Lexiscan was performed under the supervision of the Cardiology staff. At peak effect of the drug, 30 mCi Tc-4m tetrofosmin was injected intravenously and standard myocardial SPECT imaging was performed. Quantitative gated imaging was also performed to evaluate left ventricular wall motion, and estimate left ventricular ejection fraction. COMPARISON:  Chest CT-08/21/2018 FINDINGS: Raw images: Mild GI attenuation is seen worse on the provided stress images. No significant breast or chest wall attenuation. No significant patient motion artifact. Perfusion: There is a moderate sized area of decreased perfusion involving the lateral wall of the left ventricle as well as a small area of decreased profusion involving the apical aspect of the septum on the provided stress images worrisome for areas of pharmacologically induced ischemia. No definitive matched areas of decreased perfusion to suggest prior infarction. Wall Motion: The left ventricle is moderately dilated. There is severe global hypokinesia with relative akinesia involving the septum. Left Ventricular Ejection Fraction: 24 % End diastolic volume 132 ml End systolic volume 96 ml IMPRESSION: 1. Suspected areas of pharmacologically induced ischemia involving the lateral wall of the left ventricle and septum. 2. No definitive scintigraphic evidence of prior infarction. 3. Moderate left ventricular dilatation. 4. Severe left ventricular global hypokinesia with relative akinesia of the septum. Ejection fraction - 24% Electronically Signed   By: Sandi Mariscal M.D.   On: 08/22/2018 14:27   Vas [place] Burnard Bunting With/wo Tbi  Result Date: 08/22/2018 LOWER EXTREMITY DOPPLER STUDY Indications:  Claudication.  Performing Technologist: Carlos Levering RVT  Examination Guidelines: A complete evaluation includes at minimum, Doppler waveform signals and systolic blood pressure reading at the level of bilateral brachial, anterior tibial, and posterior tibial arteries, when vessel segments are accessible. Bilateral testing is considered an integral part of a complete examination. Photoelectric Plethysmograph (PPG) waveforms and toe systolic pressure readings are included as required and additional duplex testing as needed. Limited examinations for reoccurring indications may be performed as noted.  ABI Findings: +--------+------------------+-----+---------+--------+ Right   Rt Pressure (mmHg)IndexWaveform Comment  +--------+------------------+-----+---------+--------+ Brachial130                    triphasic         +--------+------------------+-----+---------+--------+ PTA     157               1.05 triphasic         +--------+------------------+-----+---------+--------+ DP      143               0.95 triphasic         +--------+------------------+-----+---------+--------+ +--------+------------------+-----+---------+-------+ Left    Lt Pressure (mmHg)IndexWaveform Comment +--------+------------------+-----+---------+-------+ Brachial150                    triphasic        +--------+------------------+-----+---------+-------+ PTA     171               1.14 triphasic        +--------+------------------+-----+---------+-------+ DP      145               0.97 triphasic        +--------+------------------+-----+---------+-------+ +-------+-----------+-----------+------------+------------+ ABI/TBIToday's ABIToday's TBIPrevious ABIPrevious TBI +-------+-----------+-----------+------------+------------+ Right  1.05                                           +-------+-----------+-----------+------------+------------+  Left   1.14                                            +-------+-----------+-----------+------------+------------+  Summary: Right: Resting right ankle-brachial index is within normal range. No evidence of significant right lower extremity arterial disease. Left: Resting left ankle-brachial index is within normal range. No evidence of significant left lower extremity arterial disease.  *See table(s) above for measurements and observations.  Electronically signed by Deitra Mayo MD on 08/22/2018 at 5:27:53 PM.   Final    Vas US Doppler Pre Cabg  Result Date: 08/28/2018 PREOPERATIVE VASCULAR EVALUATION  Indications: Pre CABG. Performing Technologist: Landry Mellow RDMS, RVT  Examination Guidelines: A complete evaluation includes B-mode imaging, spectral Doppler, color Doppler, and power Doppler as needed of all accessible portions of each vessel. Bilateral testing is considered an integral part of a complete examination. Limited examinations for reoccurring indications may be performed as noted.  Right Carotid Findings: +----------+--------+--------+--------+------------+-----------+           PSV cm/sEDV cm/sStenosisDescribe    Comments    +----------+--------+--------+--------+------------+-----------+ CCA Prox  120     11                                      +----------+--------+--------+--------+------------+-----------+ CCA Distal120     19                                      +----------+--------+--------+--------+------------+-----------+ ICA Prox  180     63      40-59%  heterogenousupper range +----------+--------+--------+--------+------------+-----------+ ICA Mid   151     56                                      +----------+--------+--------+--------+------------+-----------+ ICA Distal170     42                                      +----------+--------+--------+--------+------------+-----------+ ECA       117     9                                        +----------+--------+--------+--------+------------+-----------+ Portions of this table do not appear on this page. +----------+--------+-------+----------------+------------+           PSV cm/sEDV cmsDescribe        Arm Pressure +----------+--------+-------+----------------+------------+ Subclavian181            Multiphasic, [id number]          +----------+--------+-------+----------------+------------+ +---------+--------+--+--------+--+---------+ VertebralPSV cm/s94EDV cm/s30Antegrade +---------+--------+--+--------+--+---------+ Left Carotid Findings: +----------+--------+--------+--------+------------+-----------+           PSV cm/sEDV cm/sStenosisDescribe    Comments    +----------+--------+--------+--------+------------+-----------+ CCA Prox  109     17                                      +----------+--------+--------+--------+------------+-----------+ CCA [id number]  22                                      +----------+--------+--------+--------+------------+-----------+ ICA Prox  189     60      40-59%  heterogenousupper range +----------+--------+--------+--------+------------+-----------+ ICA Mid   160     51                                      +----------+--------+--------+--------+------------+-----------+ ICA Distal127     45                                      +----------+--------+--------+--------+------------+-----------+ ECA       160     33                                      +----------+--------+--------+--------+------------+-----------+ +----------+--------+--------+----------------+------------+ SubclavianPSV cm/sEDV cm/sDescribe        Arm Pressure +----------+--------+--------+----------------+------------+           333             Multiphasic, [id number]          +----------+--------+--------+----------------+------------+ +---------+--------+--+--------+--+---------+ VertebralPSV cm/s98EDV cm/s23Antegrade  +---------+--------+--+--------+--+---------+  ABI Findings: +--------+------------------+-----+---------+--------+ Right   Rt Pressure (mmHg)IndexWaveform Comment  +--------+------------------+-----+---------+--------+ [id number]                    triphasic         +--------+------------------+-----+---------+--------+ +--------+------------------+-----+---------+-------+ Left    Lt Pressure (mmHg)IndexWaveform Comment +--------+------------------+-----+---------+-------+ [id number]                    triphasic        +--------+------------------+-----+---------+-------+ ABI done 08/22/18 within normal range.  Right Doppler Findings: +--------+--------+-----+---------+--------+ Site    PressureIndexDoppler  Comments +--------+--------+-----+---------+--------+ [id number]          triphasic         +--------+--------+-----+---------+--------+ Radial               triphasic         +--------+--------+-----+---------+--------+ Ulnar                triphasic         +--------+--------+-----+---------+--------+  Left Doppler Findings: +--------+--------+-----+---------+--------+ Site    PressureIndexDoppler  Comments +--------+--------+-----+---------+--------+ [id number]          triphasic         +--------+--------+-----+---------+--------+ Radial               triphasic         +--------+--------+-----+---------+--------+ Ulnar                triphasic         +--------+--------+-----+---------+--------+  Summary: Right Carotid: Velocities in the right ICA are consistent with a 40-59%                stenosis. Left Carotid: Velocities in the left ICA are consistent with a 40-59% stenosis. Right Upper Extremity: Doppler waveforms decrease 50% with right radial compression. Doppler waveforms remain within normal limits with right ulnar compression. Left Upper Extremity: Doppler waveforms remain within normal limits with left radial compression.  Doppler waveforms remain within normal limits with left ulnar  compression.  Electronically signed by Curt Jews MD on 08/28/2018 at 8:52:24 AM.    Final     Lab Data:  CBC: Recent Labs  Lab 08/24/18 0753 08/25/18 0217 08/26/18 0449 08/27/18 0448 08/28/18 0347  WBC 10.2 10.7* 9.0 7.9 8.6  HGB 12.2 10.5* 9.8* 10.0* 10.0*  HCT 40.3 35.5* 31.9* 33.5* 32.5*  MCV 86.9 87.4 86.0 85.7 86.0  PLT 150 155 151 141* 829   Basic Metabolic Panel: Recent Labs  Lab 08/22/18 0132 08/23/18 0419  08/24/18 0126 08/24/18 0753 08/25/18 0217 08/26/18 0449 08/28/18 0347  NA 134* 136   < > 140 140 135 136 140  K 5.3* 5.2*   < > 4.6 4.9 4.8 4.5 4.5  CL 108 110   < > 112* 117* 112* 113* 116*  CO2 19* 17*   < > 20* 15* 18* 17* 19*  GLUCOSE 430* 260*   < > 161* 160* 285* 290* 98  BUN 40* 44*   < > 34* 31* 29* 28* 28*  CREATININE 1.76* 1.85*   < > 1.57* 1.57* 1.49* 1.55* 1.65*  CALCIUM 8.7* 9.0   < > 9.1 8.9 8.5* 8.7* 8.8*  MG 1.6* 1.6*  --   --  2.3 2.1 1.7  --    < > = values in this interval not displayed.   GFR: Estimated Creatinine Clearance: 30.3 mL/min (A) (by C-G formula based on SCr of 1.65 mg/dL (H)). Liver Function Tests: Recent Labs  Lab 08/24/18 1000  AST 13*  ALT 14  ALKPHOS 93  BILITOT 0.4  PROT 6.2*  ALBUMIN 2.9*   Cardiac Enzymes: Recent Labs  Lab 08/21/18 1822 08/22/18 0132  TROPONINI <0.03 <0.03   HbA1C: No results for input(s): HGBA1C in the last 72 hours. CBG: Recent Labs  Lab 08/27/18 1128 08/27/18 1700 08/27/18 2122 08/28/18 0750 08/28/18 1124  GLUCAP 265* 191* 117* 83 187*   Lipid Profile: No results for input(s): CHOL, HDL, LDLCALC, TRIG, CHOLHDL, LDLDIRECT in the last 72 hours. Urine analysis:    Component Value Date/Time   COLORURINE YELLOW 08/24/2018 0856   APPEARANCEUR CLOUDY (A) 08/24/2018 0856   LABSPEC 1.012 08/24/2018 0856   PHURINE 5.0 08/24/2018 0856   GLUCOSEU NEGATIVE 08/24/2018 0856   HGBUR LARGE (A) 08/24/2018 0856    BILIRUBINUR NEGATIVE 08/24/2018 0856   KETONESUR NEGATIVE 08/24/2018 0856   PROTEINUR 100 (A) 08/24/2018 0856   NITRITE NEGATIVE 08/24/2018 0856   LEUKOCYTESUR LARGE (A) 08/24/2018 0856     Vernell Leep, MD, FACP, [facility]. [facility] Pager [phone]  If 7PM-7AM, please contact night-coverage [email or web address] Password [facility] 08/28/2018, 12:33 PM

## 2018-08-28 NOTE — Progress Notes (Signed)
3 Days Post-Op Procedure(s) (LRB): RIGHT HEART CATH (N/A) Subjective: No chest pain or shortness of breath. Husband is with her and he speaks [place].  Objective: Vital signs in last 24 hours: Temp:  [97.4 F (36.3 C)-98.1 F (36.7 C)] 97.4 F (36.3 C) (11/03 0827) Pulse Rate:  [68-143] 143 (11/03 0827) Cardiac Rhythm: Normal sinus rhythm (11/03 0701) Resp:  [16] 16 (11/03 0500) BP: (127-155)/(71-101) 148/101 (11/03 0827) SpO2:  [96 %-100 %] 99 % (11/03 0840) Weight:  [69.6 kg] 69.6 kg (11/03 0500)  Hemodynamic parameters for last 24 hours:    Intake/Output from previous day: 11/02 0701 - 11/03 0700 In: 690 [P.O.:120; I.V.:470; IV Piggyback:100] Out: -  Intake/Output this shift: No intake/output data recorded.  General appearance: alert and cooperative Heart: regular rate and rhythm, S1, S2 normal, no murmur, click, rub or gallop Lungs: clear to auscultation bilaterally  Lab Results: Recent Labs    08/27/18 0448 08/28/18 0347  WBC 7.9 8.6  HGB 10.0* 10.0*  HCT 33.5* 32.5*  PLT 141* 164   BMET:  Recent Labs    08/26/18 0449 08/28/18 0347  NA 136 140  K 4.5 4.5  CL 113* 116*  CO2 17* 19*  GLUCOSE 290* 98  BUN 28* 28*  CREATININE 1.55* 1.65*  CALCIUM 8.7* 8.8*    PT/INR: No results for input(s): LABPROT, INR in the last 72 hours. ABG    Component Value Date/Time   HCO3 18.5 (L) 08/25/2018 1550   HCO3 19.3 (L) 08/25/2018 1550   TCO2 20 (L) 08/25/2018 1550   TCO2 20 (L) 08/25/2018 1550   ACIDBASEDEF 7.0 (H) 08/25/2018 1550   ACIDBASEDEF 6.0 (H) 08/25/2018 1550   O2SAT 66.0 08/25/2018 1550   O2SAT 67.0 08/25/2018 1550   CBG (last 3)  Recent Labs    08/27/18 2122 08/28/18 0750 08/28/18 1124  GLUCAP 117* 83 187*    Assessment/Plan: High grade distal LM and severe 3-vessel CAD with severe LV dysfunction. Plan CABG in am. Patient and husband have no further questions.  LOS: 6 days    Gaye Pollack 08/28/2018

## 2018-08-29 ENCOUNTER — Inpatient Hospital Stay (HOSPITAL_COMMUNITY): Payer: Medicaid Other

## 2018-08-29 ENCOUNTER — Inpatient Hospital Stay (HOSPITAL_COMMUNITY)
Admission: EM | Disposition: A | Discharge status: Home / Self Care | Payer: Self-pay | Source: Home / Self Care | Attending: Surgery

## 2018-08-29 DIAGNOSIS — I2510 Atherosclerotic heart disease of native coronary artery without angina pectoris: Secondary | ICD-10-CM | POA: Diagnosis present

## 2018-08-29 HISTORY — PX: CORONARY ARTERY BYPASS GRAFT: SHX141

## 2018-08-29 HISTORY — PX: TEE WITHOUT CARDIOVERSION: SHX5443

## 2018-08-29 LAB — GLUCOSE, CAPILLARY
GLUCOSE-CAPILLARY: 165 mg/dL — AB (ref 70–99)
GLUCOSE-CAPILLARY: 79 mg/dL (ref 70–99)
GLUCOSE-CAPILLARY: 139 mg/dL — AB (ref 70–99)
GLUCOSE-CAPILLARY: 158 mg/dL — AB (ref 70–99)
GLUCOSE-CAPILLARY: 123 mg/dL — AB (ref 70–99)
Glucose-Capillary: 102 mg/dL — ABNORMAL HIGH (ref 70–99)
Glucose-Capillary: 112 mg/dL — ABNORMAL HIGH (ref 70–99)
Glucose-Capillary: 136 mg/dL — ABNORMAL HIGH (ref 70–99)
Glucose-Capillary: 145 mg/dL — ABNORMAL HIGH (ref 70–99)
Glucose-Capillary: 145 mg/dL — ABNORMAL HIGH (ref 70–99)
Glucose-Capillary: 89 mg/dL (ref 70–99)

## 2018-08-29 LAB — POCT I-STAT, CHEM 8
BUN: 27 mg/dL — ABNORMAL HIGH (ref 8–23)
BUN: 20 mg/dL (ref 8–23)
BUN: 25 mg/dL — ABNORMAL HIGH (ref 8–23)
BUN: 23 mg/dL (ref 8–23)
BUN: 25 mg/dL — ABNORMAL HIGH (ref 8–23)
BUN: 25 mg/dL — AB (ref 8–23)
BUN: 23 mg/dL (ref 8–23)
CALCIUM ION: 1.31 mmol/L (ref 1.15–1.40)
CALCIUM ION: 0.99 mmol/L — AB (ref 1.15–1.40)
CALCIUM ION: 1.31 mmol/L (ref 1.15–1.40)
CALCIUM ION: 1.16 mmol/L (ref 1.15–1.40)
CALCIUM ION: 1.14 mmol/L — AB (ref 1.15–1.40)
CALCIUM ION: 1.15 mmol/L (ref 1.15–1.40)
CHLORIDE: 114 mmol/L — AB (ref 98–111)
CHLORIDE: 111 mmol/L (ref 98–111)
CHLORIDE: 110 mmol/L (ref 98–111)
CREATININE: 1.3 mg/dL — AB (ref 0.44–1.00)
Calcium, Ion: 1.15 mmol/L (ref 1.15–1.40)
Chloride: 114 mmol/L — ABNORMAL HIGH (ref 98–111)
Chloride: 109 mmol/L (ref 98–111)
Chloride: 111 mmol/L (ref 98–111)
Chloride: 111 mmol/L (ref 98–111)
Creatinine, Ser: 1.3 mg/dL — ABNORMAL HIGH (ref 0.44–1.00)
Creatinine, Ser: 1.1 mg/dL — ABNORMAL HIGH (ref 0.44–1.00)
Creatinine, Ser: 1.3 mg/dL — ABNORMAL HIGH (ref 0.44–1.00)
Creatinine, Ser: 1.3 mg/dL — ABNORMAL HIGH (ref 0.44–1.00)
Creatinine, Ser: 1.3 mg/dL — ABNORMAL HIGH (ref 0.44–1.00)
Creatinine, Ser: 1.5 mg/dL — ABNORMAL HIGH (ref 0.44–1.00)
GLUCOSE: 175 mg/dL — AB (ref 70–99)
GLUCOSE: 117 mg/dL — AB (ref 70–99)
GLUCOSE: 90 mg/dL (ref 70–99)
GLUCOSE: 154 mg/dL — AB (ref 70–99)
Glucose, Bld: 112 mg/dL — ABNORMAL HIGH (ref 70–99)
Glucose, Bld: 164 mg/dL — ABNORMAL HIGH (ref 70–99)
Glucose, Bld: 171 mg/dL — ABNORMAL HIGH (ref 70–99)
HCT: 30 % — ABNORMAL LOW (ref 36.0–46.0)
HCT: 25 % — ABNORMAL LOW (ref 36.0–46.0)
HCT: 26 % — ABNORMAL LOW (ref 36.0–46.0)
HCT: 20 % — ABNORMAL LOW (ref 36.0–46.0)
HCT: 23 % — ABNORMAL LOW (ref 36.0–46.0)
HCT: 27 % — ABNORMAL LOW (ref 36.0–46.0)
HEMATOCRIT: 26 % — AB (ref 36.0–46.0)
HEMOGLOBIN: 10.2 g/dL — AB (ref 12.0–15.0)
HEMOGLOBIN: 8.5 g/dL — AB (ref 12.0–15.0)
HEMOGLOBIN: 7.8 g/dL — AB (ref 12.0–15.0)
HEMOGLOBIN: 8.8 g/dL — AB (ref 12.0–15.0)
Hemoglobin: 8.8 g/dL — ABNORMAL LOW (ref 12.0–15.0)
Hemoglobin: 6.8 g/dL — CL (ref 12.0–15.0)
Hemoglobin: 9.2 g/dL — ABNORMAL LOW (ref 12.0–15.0)
POTASSIUM: 5.9 mmol/L — AB (ref 3.5–5.1)
POTASSIUM: 5.6 mmol/L — AB (ref 3.5–5.1)
Potassium: 5.7 mmol/L — ABNORMAL HIGH (ref 3.5–5.1)
Potassium: 6.9 mmol/L (ref 3.5–5.1)
Potassium: 7.2 mmol/L (ref 3.5–5.1)
Potassium: 6.7 mmol/L (ref 3.5–5.1)
Potassium: 4.5 mmol/L (ref 3.5–5.1)
SODIUM: 139 mmol/L (ref 135–145)
SODIUM: 138 mmol/L (ref 135–145)
SODIUM: 140 mmol/L (ref 135–145)
SODIUM: 143 mmol/L (ref 135–145)
Sodium: 141 mmol/L (ref 135–145)
Sodium: 140 mmol/L (ref 135–145)
Sodium: 143 mmol/L (ref 135–145)
TCO2: 21 mmol/L — ABNORMAL LOW (ref 22–32)
TCO2: 22 mmol/L (ref 22–32)
TCO2: 23 mmol/L (ref 22–32)
TCO2: 23 mmol/L (ref 22–32)
TCO2: 24 mmol/L (ref 22–32)
TCO2: 25 mmol/L (ref 22–32)
TCO2: 25 mmol/L (ref 22–32)

## 2018-08-29 LAB — POCT I-STAT 3, ART BLOOD GAS (G3+)
ACID-BASE DEFICIT: 2 mmol/L (ref 0.0–2.0)
ACID-BASE DEFICIT: 8 mmol/L — AB (ref 0.0–2.0)
Acid-base deficit: 4 mmol/L — ABNORMAL HIGH (ref 0.0–2.0)
Acid-base deficit: 3 mmol/L — ABNORMAL HIGH (ref 0.0–2.0)
BICARBONATE: 24.8 mmol/L (ref 20.0–28.0)
Bicarbonate: 20.7 mmol/L (ref 20.0–28.0)
Bicarbonate: 17.9 mmol/L — ABNORMAL LOW (ref 20.0–28.0)
Bicarbonate: 23.2 mmol/L (ref 20.0–28.0)
Bicarbonate: 22.9 mmol/L (ref 20.0–28.0)
O2 SAT: 97 %
O2 Saturation: 100 %
O2 Saturation: 100 %
O2 Saturation: 97 %
O2 Saturation: 96 %
PCO2 ART: 41.3 mmHg (ref 32.0–48.0)
PH ART: 7.366 (ref 7.350–7.450)
PH ART: 7.316 — AB (ref 7.350–7.450)
PH ART: 7.349 — AB (ref 7.350–7.450)
PO2 ART: 461 mmHg — AB (ref 83.0–108.0)
PO2 ART: 81 mmHg — AB (ref 83.0–108.0)
PO2 ART: 92 mmHg (ref 83.0–108.0)
Patient temperature: 36.4
TCO2: 22 mmol/L (ref 22–32)
TCO2: 19 mmol/L — ABNORMAL LOW (ref 22–32)
TCO2: 24 mmol/L (ref 22–32)
TCO2: 26 mmol/L (ref 22–32)
TCO2: 24 mmol/L (ref 22–32)
pCO2 arterial: 36.2 mmHg (ref 32.0–48.0)
pCO2 arterial: 34.5 mmHg (ref 32.0–48.0)
pCO2 arterial: 43 mmHg (ref 32.0–48.0)
pCO2 arterial: 40.1 mmHg (ref 32.0–48.0)
pH, Arterial: 7.339 — ABNORMAL LOW (ref 7.350–7.450)
pH, Arterial: 7.396 (ref 7.350–7.450)
pO2, Arterial: 424 mmHg — ABNORMAL HIGH (ref 83.0–108.0)
pO2, Arterial: 98 mmHg (ref 83.0–108.0)

## 2018-08-29 LAB — CBC WITH DIFFERENTIAL/PLATELET
Abs Immature Granulocytes: 0.15 10*3/uL — ABNORMAL HIGH (ref 0.00–0.07)
BASOS PCT: 0 %
Basophils Absolute: 0 10*3/uL (ref 0.0–0.1)
EOS ABS: 0.2 10*3/uL (ref 0.0–0.5)
EOS PCT: 1 %
HCT: 29.8 % — ABNORMAL LOW (ref 36.0–46.0)
Hemoglobin: 9.3 g/dL — ABNORMAL LOW (ref 12.0–15.0)
Immature Granulocytes: 1 %
Lymphocytes Relative: 5 %
Lymphs Abs: 0.8 10*3/uL (ref 0.7–4.0)
MCH: 27.1 pg (ref 26.0–34.0)
MCHC: 31.2 g/dL (ref 30.0–36.0)
MCV: 86.9 fL (ref 80.0–100.0)
MONO ABS: 1 10*3/uL (ref 0.1–1.0)
Monocytes Relative: 6 %
NEUTROS ABS: 13.1 10*3/uL — AB (ref 1.7–7.7)
Neutrophils Relative %: 87 %
PLATELETS: 137 10*3/uL — AB (ref 150–400)
RBC: 3.43 MIL/uL — ABNORMAL LOW (ref 3.87–5.11)
RDW: 14.5 % (ref 11.5–15.5)
WBC: 15.2 10*3/uL — ABNORMAL HIGH (ref 4.0–10.5)
nRBC: 0 % (ref 0.0–0.2)

## 2018-08-29 LAB — POCT I-STAT 3, VENOUS BLOOD GAS (G3P V)
Acid-base deficit: 4 mmol/L — ABNORMAL HIGH (ref 0.0–2.0)
Bicarbonate: 21.2 mmol/L (ref 20.0–28.0)
O2 SAT: 98 %
PCO2 VEN: 40.2 mmHg — AB (ref 44.0–60.0)
PO2 VEN: 111 mmHg — AB (ref 32.0–45.0)
Patient temperature: 37.4
TCO2: 22 mmol/L (ref 22–32)
pH, Ven: 7.333 (ref 7.250–7.430)

## 2018-08-29 LAB — HEMOGLOBIN AND HEMATOCRIT, BLOOD
HCT: 21.1 % — ABNORMAL LOW (ref 36.0–46.0)
Hemoglobin: 6.3 g/dL — CL (ref 12.0–15.0)

## 2018-08-29 LAB — CBC
HCT: 29.4 % — ABNORMAL LOW (ref 36.0–46.0)
HEMATOCRIT: 37.2 % (ref 36.0–46.0)
Hemoglobin: 11.2 g/dL — ABNORMAL LOW (ref 12.0–15.0)
Hemoglobin: 9.1 g/dL — ABNORMAL LOW (ref 12.0–15.0)
MCH: 26 pg (ref 26.0–34.0)
MCH: 27.1 pg (ref 26.0–34.0)
MCHC: 30.1 g/dL (ref 30.0–36.0)
MCHC: 31 g/dL (ref 30.0–36.0)
MCV: 86.3 fL (ref 80.0–100.0)
MCV: 87.5 fL (ref 80.0–100.0)
PLATELETS: 158 10*3/uL (ref 150–400)
Platelets: 115 10*3/uL — ABNORMAL LOW (ref 150–400)
RBC: 4.31 MIL/uL (ref 3.87–5.11)
RBC: 3.36 MIL/uL — ABNORMAL LOW (ref 3.87–5.11)
RDW: 14.6 % (ref 11.5–15.5)
RDW: 14.4 % (ref 11.5–15.5)
WBC: 8.6 10*3/uL (ref 4.0–10.5)
WBC: 16.6 10*3/uL — ABNORMAL HIGH (ref 4.0–10.5)
nRBC: 0 % (ref 0.0–0.2)
nRBC: 0 % (ref 0.0–0.2)

## 2018-08-29 LAB — SURGICAL PCR SCREEN
MRSA, PCR: NEGATIVE
Staphylococcus aureus: NEGATIVE

## 2018-08-29 LAB — BASIC METABOLIC PANEL
Anion gap: 8 (ref 5–15)
BUN: 27 mg/dL — AB (ref 8–23)
CALCIUM: 9.4 mg/dL (ref 8.9–10.3)
CO2: 17 mmol/L — AB (ref 22–32)
CREATININE: 1.52 mg/dL — AB (ref 0.44–1.00)
Chloride: 114 mmol/L — ABNORMAL HIGH (ref 98–111)
GFR calc Af Amer: 40 mL/min — ABNORMAL LOW (ref >60)
GFR calc non Af Amer: 35 mL/min — ABNORMAL LOW (ref >60)
GLUCOSE: 169 mg/dL — AB (ref 70–99)
Potassium: 5.5 mmol/L — ABNORMAL HIGH (ref 3.5–5.1)
Sodium: 139 mmol/L (ref 135–145)

## 2018-08-29 LAB — CREATININE, SERUM
Creatinine, Ser: 1.53 mg/dL — ABNORMAL HIGH (ref 0.44–1.00)
GFR calc Af Amer: 40 mL/min — ABNORMAL LOW (ref >60)
GFR calc non Af Amer: 35 mL/min — ABNORMAL LOW (ref >60)

## 2018-08-29 LAB — PROTIME-INR
INR: 0.99
INR: 1.38
Prothrombin Time: 13 seconds (ref 11.4–15.2)
Prothrombin Time: 16.8 seconds — ABNORMAL HIGH (ref 11.4–15.2)

## 2018-08-29 LAB — HEPARIN LEVEL (UNFRACTIONATED): HEPARIN UNFRACTIONATED: 0.52 [IU]/mL (ref 0.30–0.70)

## 2018-08-29 LAB — PREPARE RBC (CROSSMATCH)

## 2018-08-29 LAB — POCT I-STAT 4, (NA,K, GLUC, HGB,HCT)
GLUCOSE: 127 mg/dL — AB (ref 70–99)
HCT: 24 % — ABNORMAL LOW (ref 36.0–46.0)
HEMOGLOBIN: 8.2 g/dL — AB (ref 12.0–15.0)
POTASSIUM: 4.2 mmol/L (ref 3.5–5.1)
Sodium: 146 mmol/L — ABNORMAL HIGH (ref 135–145)

## 2018-08-29 LAB — PLATELET COUNT: Platelets: 99 10*3/uL — ABNORMAL LOW (ref 150–400)

## 2018-08-29 LAB — APTT
aPTT: 108 seconds — ABNORMAL HIGH (ref 24–36)
aPTT: 36 seconds (ref 24–36)

## 2018-08-29 LAB — MAGNESIUM: MAGNESIUM: 3.3 mg/dL — AB (ref 1.7–2.4)

## 2018-08-29 SURGERY — CORONARY ARTERY BYPASS GRAFTING (CABG)
Anesthesia: General | Site: Chest

## 2018-08-29 MED ORDER — SUCCINYLCHOLINE CHLORIDE 200 MG/10ML IV SOSY
PREFILLED_SYRINGE | INTRAVENOUS | Status: AC
  Filled 2018-08-29: qty 10

## 2018-08-29 MED ORDER — FUROSEMIDE 10 MG/ML IJ SOLN
INTRAMUSCULAR | Status: DC | PRN
  Administered 2018-08-29: 40 mg via INTRAMUSCULAR

## 2018-08-29 MED ORDER — DOPAMINE-DEXTROSE 3.2-5 MG/ML-% IV SOLN
3.0000 ug/kg/min | INTRAVENOUS | Status: DC
  Administered 2018-08-29 – 2018-08-30 (×2): 3 ug/kg/min via INTRAVENOUS

## 2018-08-29 MED ORDER — CHLORHEXIDINE GLUCONATE CLOTH 2 % EX PADS
6.0000 | MEDICATED_PAD | Freq: Every day | CUTANEOUS | Status: DC
  Administered 2018-08-29 – 2018-09-02 (×4): 6 via TOPICAL

## 2018-08-29 MED ORDER — SODIUM CHLORIDE 0.9 % IV SOLN: 250.0000 mL | INTRAVENOUS | Status: DC

## 2018-08-29 MED ORDER — LACTATED RINGERS IV SOLN
INTRAVENOUS | Status: DC
  Administered 2018-08-29 – 2018-08-31 (×2): via INTRAVENOUS

## 2018-08-29 MED ORDER — SODIUM CHLORIDE 0.9% FLUSH
3.0000 mL | Freq: Two times a day (BID) | INTRAVENOUS | Status: DC
  Administered 2018-08-30 – 2018-09-01 (×3): 3 mL via INTRAVENOUS

## 2018-08-29 MED ORDER — NOREPINEPHRINE 4 MG/250ML-% IV SOLN
0.0000 ug/min | INTRAVENOUS | Status: DC
  Administered 2018-08-29: 4 ug/min via INTRAVENOUS
  Filled 2018-08-29: qty 250

## 2018-08-29 MED ORDER — MIDAZOLAM HCL 5 MG/5ML IJ SOLN
INTRAMUSCULAR | Status: DC | PRN
  Administered 2018-08-29: 1 mg via INTRAVENOUS
  Administered 2018-08-29 (×2): 3 mg via INTRAVENOUS
  Administered 2018-08-29: 1 mg via INTRAVENOUS
  Administered 2018-08-29: 2 mg via INTRAVENOUS

## 2018-08-29 MED ORDER — PROTAMINE SULFATE 10 MG/ML IV SOLN
INTRAVENOUS | Status: DC | PRN
  Administered 2018-08-29: 250 mg via INTRAVENOUS

## 2018-08-29 MED ORDER — METOPROLOL TARTRATE 12.5 MG HALF TABLET: 12.5000 mg | ORAL_TABLET | Freq: Two times a day (BID) | ORAL | Status: DC

## 2018-08-29 MED ORDER — SODIUM CHLORIDE 0.9% FLUSH
10.0000 mL | Freq: Two times a day (BID) | INTRAVENOUS | Status: DC
  Administered 2018-08-29 – 2018-09-02 (×6): 10 mL

## 2018-08-29 MED ORDER — SODIUM CHLORIDE 0.9% FLUSH: 10.0000 mL | INTRAVENOUS | Status: DC | PRN

## 2018-08-29 MED ORDER — LACTATED RINGERS IV SOLN
INTRAVENOUS | Status: DC | PRN
  Administered 2018-08-29 (×2): via INTRAVENOUS

## 2018-08-29 MED ORDER — LACTATED RINGERS IV SOLN
INTRAVENOUS | Status: DC | PRN
  Administered 2018-08-29: 08:00:00 via INTRAVENOUS

## 2018-08-29 MED ORDER — SODIUM CHLORIDE 0.9 % IV SOLN
1.5000 g | Freq: Two times a day (BID) | INTRAVENOUS | Status: AC
  Administered 2018-08-29 – 2018-08-31 (×4): 1.5 g via INTRAVENOUS
  Filled 2018-08-29 (×4): qty 1.5

## 2018-08-29 MED ORDER — LACTATED RINGERS IV SOLN
INTRAVENOUS | Status: DC | PRN
  Administered 2018-08-29: 07:00:00 via INTRAVENOUS

## 2018-08-29 MED ORDER — SODIUM CHLORIDE 0.9% FLUSH: 3.0000 mL | INTRAVENOUS | Status: DC | PRN

## 2018-08-29 MED ORDER — THROMBIN 5000 UNITS EX SOLR
CUTANEOUS | Status: AC
  Filled 2018-08-29: qty 10000

## 2018-08-29 MED ORDER — SODIUM BICARBONATE 8.4 % IV SOLN
100.0000 meq | Freq: Once | INTRAVENOUS | Status: AC
  Administered 2018-08-29: 100 meq via INTRAVENOUS

## 2018-08-29 MED ORDER — HEPARIN SODIUM (PORCINE) 1000 UNIT/ML IJ SOLN
INTRAMUSCULAR | Status: DC | PRN
  Administered 2018-08-29: 25000 [IU] via INTRAVENOUS

## 2018-08-29 MED ORDER — LIDOCAINE 2% (20 MG/ML) 5 ML SYRINGE: INTRAMUSCULAR | Status: DC | PRN

## 2018-08-29 MED ORDER — INSULIN REGULAR BOLUS VIA INFUSION
0.0000 [IU] | Freq: Three times a day (TID) | INTRAVENOUS | Status: DC
  Filled 2018-08-29: qty 10

## 2018-08-29 MED ORDER — ASPIRIN EC 325 MG PO TBEC: 325.0000 mg | DELAYED_RELEASE_TABLET | Freq: Every day | ORAL | Status: DC

## 2018-08-29 MED ORDER — ETOMIDATE 2 MG/ML IV SOLN
INTRAVENOUS | Status: DC | PRN
  Administered 2018-08-29: 12 mg via INTRAVENOUS

## 2018-08-29 MED ORDER — FENTANYL CITRATE (PF) 250 MCG/5ML IJ SOLN
INTRAMUSCULAR | Status: AC
  Filled 2018-08-29: qty 25

## 2018-08-29 MED ORDER — FAMOTIDINE IN NACL 20-0.9 MG/50ML-% IV SOLN: 20.0000 mg | Freq: Two times a day (BID) | INTRAVENOUS | Status: DC

## 2018-08-29 MED ORDER — 0.9 % SODIUM CHLORIDE (POUR BTL) OPTIME
TOPICAL | Status: DC | PRN
  Administered 2018-08-29: 6000 mL

## 2018-08-29 MED ORDER — VANCOMYCIN HCL IN DEXTROSE 1-5 GM/200ML-% IV SOLN
1000.0000 mg | Freq: Once | INTRAVENOUS | Status: AC
  Administered 2018-08-29: 1000 mg via INTRAVENOUS

## 2018-08-29 MED ORDER — FENTANYL CITRATE (PF) 250 MCG/5ML IJ SOLN
INTRAMUSCULAR | Status: DC | PRN
  Administered 2018-08-29: 100 ug via INTRAVENOUS
  Administered 2018-08-29: 200 ug via INTRAVENOUS
  Administered 2018-08-29: 50 ug via INTRAVENOUS
  Administered 2018-08-29 (×3): 250 ug via INTRAVENOUS
  Administered 2018-08-29: 150 ug via INTRAVENOUS

## 2018-08-29 MED ORDER — FAMOTIDINE IN NACL 20-0.9 MG/50ML-% IV SOLN
20.0000 mg | Freq: Two times a day (BID) | INTRAVENOUS | Status: AC
  Administered 2018-08-29 (×2): 20 mg via INTRAVENOUS
  Filled 2018-08-29 (×2): qty 50

## 2018-08-29 MED ORDER — PHENYLEPHRINE HCL-NACL 20-0.9 MG/250ML-% IV SOLN
0.0000 ug/min | INTRAVENOUS | Status: DC
  Filled 2018-08-29: qty 250

## 2018-08-29 MED ORDER — VASOPRESSIN 20 UNIT/ML IV SOLN
INTRAVENOUS | Status: AC
  Filled 2018-08-29: qty 1

## 2018-08-29 MED ORDER — ACETAMINOPHEN 160 MG/5ML PO SOLN: 650.0000 mg | Freq: Once | ORAL | Status: AC

## 2018-08-29 MED ORDER — INSULIN REGULAR(HUMAN) IN NACL 100-0.9 UT/100ML-% IV SOLN
INTRAVENOUS | Status: DC
  Administered 2018-08-29: 0.2 [IU]/h via INTRAVENOUS

## 2018-08-29 MED ORDER — LIDOCAINE 2% (20 MG/ML) 5 ML SYRINGE
INTRAMUSCULAR | Status: AC
  Filled 2018-08-29: qty 5

## 2018-08-29 MED ORDER — EPHEDRINE 5 MG/ML INJ
INTRAVENOUS | Status: AC
  Filled 2018-08-29: qty 10

## 2018-08-29 MED ORDER — ACETAMINOPHEN 160 MG/5ML PO SOLN
1000.0000 mg | Freq: Four times a day (QID) | ORAL | Status: DC
  Administered 2018-08-29: 1000 mg
  Filled 2018-08-29: qty 40.6

## 2018-08-29 MED ORDER — ACETAMINOPHEN 650 MG RE SUPP
650.0000 mg | Freq: Once | RECTAL | Status: AC
  Administered 2018-08-29: 650 mg via RECTAL

## 2018-08-29 MED ORDER — LACTATED RINGERS IV SOLN: 500.0000 mL | Freq: Once | INTRAVENOUS | Status: DC | PRN

## 2018-08-29 MED ORDER — SODIUM CHLORIDE 0.9 % IV SOLN
INTRAVENOUS | Status: DC | PRN
  Administered 2018-08-29: 30 ug/min via INTRAVENOUS

## 2018-08-29 MED ORDER — ASPIRIN 81 MG PO CHEW: 324.0000 mg | CHEWABLE_TABLET | Freq: Every day | ORAL | Status: DC

## 2018-08-29 MED ORDER — BISACODYL 10 MG RE SUPP: 10.0000 mg | Freq: Every day | RECTAL | Status: DC

## 2018-08-29 MED ORDER — SODIUM CHLORIDE 0.9 % IJ SOLN
INTRAMUSCULAR | Status: AC
  Filled 2018-08-29: qty 10

## 2018-08-29 MED ORDER — THROMBIN 5000 UNITS EX SOLR
OROMUCOSAL | Status: DC | PRN
  Administered 2018-08-29 (×2): 4 mL via TOPICAL

## 2018-08-29 MED ORDER — PANTOPRAZOLE SODIUM 40 MG PO TBEC
40.0000 mg | DELAYED_RELEASE_TABLET | Freq: Every day | ORAL | Status: DC
  Administered 2018-08-31 – 2018-09-02 (×3): 40 mg via ORAL
  Filled 2018-08-29 (×3): qty 1

## 2018-08-29 MED ORDER — MILRINONE LACTATE IN DEXTROSE 20-5 MG/100ML-% IV SOLN
0.3000 ug/kg/min | INTRAVENOUS | Status: DC
  Administered 2018-08-29 (×2): 0.3 ug/kg/min via INTRAVENOUS
  Filled 2018-08-29: qty 100

## 2018-08-29 MED ORDER — ORAL CARE MOUTH RINSE
15.0000 mL | OROMUCOSAL | Status: DC
  Administered 2018-08-29 (×2): 15 mL via OROMUCOSAL

## 2018-08-29 MED ORDER — OXYCODONE HCL 5 MG PO TABS: 5.0000 mg | ORAL_TABLET | ORAL | Status: DC | PRN

## 2018-08-29 MED ORDER — PROTAMINE SULFATE 10 MG/ML IV SOLN
INTRAVENOUS | Status: AC
  Filled 2018-08-29: qty 50

## 2018-08-29 MED ORDER — MIDAZOLAM HCL 10 MG/2ML IJ SOLN
INTRAMUSCULAR | Status: AC
  Filled 2018-08-29: qty 2

## 2018-08-29 MED ORDER — ACETAMINOPHEN 500 MG PO TABS
1000.0000 mg | ORAL_TABLET | Freq: Four times a day (QID) | ORAL | Status: DC
  Administered 2018-08-31 – 2018-09-02 (×7): 1000 mg via ORAL
  Filled 2018-08-29 (×7): qty 2

## 2018-08-29 MED ORDER — METOPROLOL TARTRATE 25 MG/10 ML ORAL SUSPENSION: 12.5000 mg | Freq: Two times a day (BID) | ORAL | Status: DC

## 2018-08-29 MED ORDER — CHLORHEXIDINE GLUCONATE 0.12% ORAL RINSE (MEDLINE KIT)
15.0000 mL | Freq: Two times a day (BID) | OROMUCOSAL | Status: DC
  Administered 2018-08-29: 15 mL via OROMUCOSAL

## 2018-08-29 MED ORDER — SODIUM CHLORIDE 0.9 % IV SOLN
INTRAVENOUS | Status: DC
  Administered 2018-08-29: 15:00:00 via INTRAVENOUS

## 2018-08-29 MED ORDER — SUCCINYLCHOLINE CHLORIDE 20 MG/ML IJ SOLN
INTRAMUSCULAR | Status: DC | PRN
  Administered 2018-08-29: 100 mg via INTRAVENOUS

## 2018-08-29 MED ORDER — BUDESONIDE 0.25 MG/2ML IN SUSP
0.2500 mg | Freq: Two times a day (BID) | RESPIRATORY_TRACT | Status: DC
  Administered 2018-08-30 – 2018-09-22 (×43): 0.25 mg via RESPIRATORY_TRACT
  Filled 2018-08-29 (×48): qty 2

## 2018-08-29 MED ORDER — PHENYLEPHRINE 40 MCG/ML (10ML) SYRINGE FOR IV PUSH (FOR BLOOD PRESSURE SUPPORT)
PREFILLED_SYRINGE | INTRAVENOUS | Status: AC
  Filled 2018-08-29: qty 10

## 2018-08-29 MED ORDER — TRAMADOL HCL 50 MG PO TABS: 50.0000 mg | ORAL_TABLET | ORAL | Status: DC | PRN

## 2018-08-29 MED ORDER — SODIUM CHLORIDE 0.9 % IV SOLN
INTRAVENOUS | Status: DC | PRN
  Administered 2018-08-29: 750 mg via INTRAVENOUS

## 2018-08-29 MED ORDER — VECURONIUM BROMIDE 10 MG IV SOLR
INTRAVENOUS | Status: AC
  Filled 2018-08-29: qty 20

## 2018-08-29 MED ORDER — MAGNESIUM SULFATE 4 GM/100ML IV SOLN
4.0000 g | Freq: Once | INTRAVENOUS | Status: AC
  Administered 2018-08-29: 4 g via INTRAVENOUS
  Filled 2018-08-29: qty 100

## 2018-08-29 MED ORDER — MIDAZOLAM HCL 2 MG/2ML IJ SOLN: 2.0000 mg | INTRAMUSCULAR | Status: DC | PRN

## 2018-08-29 MED ORDER — DEXMEDETOMIDINE HCL IN NACL 200 MCG/50ML IV SOLN
0.0000 ug/kg/h | INTRAVENOUS | Status: DC
  Administered 2018-08-29: 0.7 ug/kg/h via INTRAVENOUS
  Filled 2018-08-29: qty 50

## 2018-08-29 MED ORDER — BISACODYL 5 MG PO TBEC
10.0000 mg | DELAYED_RELEASE_TABLET | Freq: Every day | ORAL | Status: DC
  Administered 2018-08-31 – 2018-09-02 (×3): 10 mg via ORAL
  Filled 2018-08-29 (×3): qty 2

## 2018-08-29 MED ORDER — MORPHINE SULFATE (PF) 2 MG/ML IV SOLN
2.0000 mg | INTRAVENOUS | Status: DC | PRN
  Administered 2018-08-30 (×6): 2 mg via INTRAVENOUS
  Filled 2018-08-29 (×7): qty 1

## 2018-08-29 MED ORDER — LACTATED RINGERS IV SOLN: INTRAVENOUS | Status: DC

## 2018-08-29 MED ORDER — SODIUM CHLORIDE 0.45 % IV SOLN
INTRAVENOUS | Status: DC | PRN
  Administered 2018-08-29: 14:00:00 via INTRAVENOUS

## 2018-08-29 MED ORDER — HEMOSTATIC AGENTS (NO CHARGE) OPTIME
TOPICAL | Status: DC | PRN
  Administered 2018-08-29: 1 via TOPICAL

## 2018-08-29 MED ORDER — ETOMIDATE 2 MG/ML IV SOLN
INTRAVENOUS | Status: AC
  Filled 2018-08-29: qty 10

## 2018-08-29 MED ORDER — ROCURONIUM BROMIDE 50 MG/5ML IV SOSY
PREFILLED_SYRINGE | INTRAVENOUS | Status: AC
  Filled 2018-08-29: qty 5

## 2018-08-29 MED ORDER — METOPROLOL TARTRATE 5 MG/5ML IV SOLN: 2.5000 mg | INTRAVENOUS | Status: DC | PRN

## 2018-08-29 MED ORDER — ATORVASTATIN CALCIUM 80 MG PO TABS
80.0000 mg | ORAL_TABLET | Freq: Every day | ORAL | Status: DC
  Administered 2018-08-31 – 2018-09-22 (×23): 80 mg via ORAL
  Filled 2018-08-29 (×23): qty 1

## 2018-08-29 MED ORDER — MORPHINE SULFATE (PF) 2 MG/ML IV SOLN
1.0000 mg | INTRAVENOUS | Status: AC | PRN
  Administered 2018-08-30: 2 mg via INTRAVENOUS

## 2018-08-29 MED ORDER — POTASSIUM CHLORIDE 10 MEQ/50ML IV SOLN: 10.0000 meq | INTRAVENOUS | Status: AC

## 2018-08-29 MED ORDER — VASOPRESSIN 20 UNIT/ML IV SOLN
0.0100 [IU]/min | INTRAVENOUS | Status: DC
  Filled 2018-08-29: qty 2

## 2018-08-29 MED ORDER — EPINEPHRINE PF 1 MG/ML IJ SOLN
0.5000 ug/min | INTRAVENOUS | Status: DC
  Administered 2018-08-29: 1 ug/min via INTRAVENOUS
  Filled 2018-08-29: qty 4

## 2018-08-29 MED ORDER — ONDANSETRON HCL 4 MG/2ML IJ SOLN
4.0000 mg | Freq: Four times a day (QID) | INTRAMUSCULAR | Status: DC | PRN
  Administered 2018-08-30 (×3): 4 mg via INTRAVENOUS
  Filled 2018-08-29 (×3): qty 2

## 2018-08-29 MED ORDER — HEPARIN SODIUM (PORCINE) 1000 UNIT/ML IJ SOLN
INTRAMUSCULAR | Status: AC
  Filled 2018-08-29: qty 1

## 2018-08-29 MED ORDER — THROMBIN 5000 UNITS EX SOLR
CUTANEOUS | Status: DC | PRN
  Administered 2018-08-29 (×2): 5000 [IU] via TOPICAL

## 2018-08-29 MED ORDER — CHLORHEXIDINE GLUCONATE 0.12 % MT SOLN
15.0000 mL | OROMUCOSAL | Status: AC
  Administered 2018-08-29: 15 mL via OROMUCOSAL

## 2018-08-29 MED ORDER — DOCUSATE SODIUM 100 MG PO CAPS
200.0000 mg | ORAL_CAPSULE | Freq: Every day | ORAL | Status: DC
  Administered 2018-08-31 – 2018-09-02 (×3): 200 mg via ORAL
  Filled 2018-08-29 (×3): qty 2

## 2018-08-29 MED ORDER — ALBUMIN HUMAN 5 % IV SOLN
250.0000 mL | INTRAVENOUS | Status: DC | PRN
  Administered 2018-08-29 – 2018-08-30 (×3): 12.5 g via INTRAVENOUS
  Filled 2018-08-29 (×2): qty 250

## 2018-08-29 MED ORDER — VECURONIUM BROMIDE 10 MG IV SOLR
INTRAVENOUS | Status: DC | PRN
  Administered 2018-08-29: 3 mg via INTRAVENOUS
  Administered 2018-08-29: 5 mg via INTRAVENOUS
  Administered 2018-08-29: 3 mg via INTRAVENOUS
  Administered 2018-08-29: 2 mg via INTRAVENOUS
  Administered 2018-08-29: 5 mg via INTRAVENOUS

## 2018-08-29 MED ORDER — NITROGLYCERIN IN D5W 200-5 MCG/ML-% IV SOLN: 0.0000 ug/min | INTRAVENOUS | Status: DC

## 2018-08-29 SURGICAL SUPPLY — 115 items
ADAPTER CARDIO PERF ANTE/RETRO (ADAPTER) ×2 IMPLANT
BAG DECANTER FOR FLEXI CONT (MISCELLANEOUS) ×4 IMPLANT
BANDAGE ACE 4X5 VEL STRL LF (GAUZE/BANDAGES/DRESSINGS) ×4 IMPLANT
BANDAGE ACE 6X5 VEL STRL LF (GAUZE/BANDAGES/DRESSINGS) ×4 IMPLANT
BANDAGE ELASTIC 4 VELCRO ST LF (GAUZE/BANDAGES/DRESSINGS) ×2 IMPLANT
BANDAGE ELASTIC 6 VELCRO ST LF (GAUZE/BANDAGES/DRESSINGS) ×2 IMPLANT
BASKET HEART  (ORDER IN 25'S) (MISCELLANEOUS) ×1
BASKET HEART (ORDER IN 25'S) (MISCELLANEOUS) ×1
BASKET HEART (ORDER IN 25S) (MISCELLANEOUS) ×2 IMPLANT
BLADE STERNUM SYSTEM 6 (BLADE) ×4 IMPLANT
BLADE SURG 11 STRL SS (BLADE) ×2 IMPLANT
BNDG GAUZE ELAST 4 BULKY (GAUZE/BANDAGES/DRESSINGS) ×4 IMPLANT
CANISTER SUCT 3000ML PPV (MISCELLANEOUS) ×4 IMPLANT
CANNULA GUNDRY RCSP 15FR (MISCELLANEOUS) ×2 IMPLANT
CATH ROBINSON RED A/P 18FR (CATHETERS) ×10 IMPLANT
CATH THORACIC 28FR (CATHETERS) ×4 IMPLANT
CATH THORACIC 36FR (CATHETERS) ×4 IMPLANT
CATH THORACIC 36FR RT ANG (CATHETERS) ×4 IMPLANT
CLIP VESOCCLUDE MED 24/CT (CLIP) IMPLANT
CLIP VESOCCLUDE SM WIDE 24/CT (CLIP) ×2 IMPLANT
COVER PROBE W GEL 5X96 (DRAPES) ×2 IMPLANT
COVER WAND RF STERILE (DRAPES) ×4 IMPLANT
CRADLE DONUT ADULT HEAD (MISCELLANEOUS) ×4 IMPLANT
DRAPE CARDIOVASCULAR INCISE (DRAPES) ×2
DRAPE SLUSH/WARMER DISC (DRAPES) ×4 IMPLANT
DRAPE SRG 135X102X78XABS (DRAPES) ×2 IMPLANT
DRSG COVADERM 4X14 (GAUZE/BANDAGES/DRESSINGS) ×4 IMPLANT
ELECT CAUTERY BLADE 6.4 (BLADE) ×4 IMPLANT
ELECT REM PT RETURN 9FT ADLT (ELECTROSURGICAL) ×8
ELECTRODE REM PT RTRN 9FT ADLT (ELECTROSURGICAL) ×4 IMPLANT
FELT TEFLON 1X6 (MISCELLANEOUS) ×8 IMPLANT
GAUZE SPONGE 4X4 12PLY STRL (GAUZE/BANDAGES/DRESSINGS) ×8 IMPLANT
GLOVE BIO SURGEON STRL SZ 6 (GLOVE) IMPLANT
GLOVE BIO SURGEON STRL SZ 6.5 (GLOVE) ×4 IMPLANT
GLOVE BIO SURGEON STRL SZ7 (GLOVE) IMPLANT
GLOVE BIO SURGEON STRL SZ7.5 (GLOVE) IMPLANT
GLOVE BIO SURGEONS STRL SZ 6.5 (GLOVE) ×4
GLOVE BIOGEL PI IND STRL 6 (GLOVE) IMPLANT
GLOVE BIOGEL PI IND STRL 6.5 (GLOVE) IMPLANT
GLOVE BIOGEL PI IND STRL 7.0 (GLOVE) IMPLANT
GLOVE BIOGEL PI IND STRL 8 (GLOVE) IMPLANT
GLOVE BIOGEL PI INDICATOR 6 (GLOVE)
GLOVE BIOGEL PI INDICATOR 6.5 (GLOVE) ×4
GLOVE BIOGEL PI INDICATOR 7.0 (GLOVE)
GLOVE BIOGEL PI INDICATOR 8 (GLOVE) ×2
GLOVE ECLIPSE 8.0 STRL XLNG CF (GLOVE) ×4 IMPLANT
GLOVE EUDERMIC 7 POWDERFREE (GLOVE) ×10 IMPLANT
GLOVE ORTHO TXT STRL SZ7.5 (GLOVE) IMPLANT
GLOVE SURG SS PI 6.0 STRL IVOR (GLOVE) ×2 IMPLANT
GOWN STRL REUS W/ TWL LRG LVL3 (GOWN DISPOSABLE) ×8 IMPLANT
GOWN STRL REUS W/ TWL XL LVL3 (GOWN DISPOSABLE) ×2 IMPLANT
GOWN STRL REUS W/TWL LRG LVL3 (GOWN DISPOSABLE) ×12
GOWN STRL REUS W/TWL XL LVL3 (GOWN DISPOSABLE) ×4
HEMOSTAT POWDER SURGIFOAM 1G (HEMOSTASIS) ×12 IMPLANT
HEMOSTAT SURGICEL 2X14 (HEMOSTASIS) ×4 IMPLANT
INSERT FOGARTY 61MM (MISCELLANEOUS) IMPLANT
INSERT FOGARTY XLG (MISCELLANEOUS) IMPLANT
KIT BASIN OR (CUSTOM PROCEDURE TRAY) ×4 IMPLANT
KIT CATH CPB BARTLE (MISCELLANEOUS) ×4 IMPLANT
KIT DRAINAGE VACCUM ASSIST (KITS) ×2 IMPLANT
KIT SUCTION CATH 14FR (SUCTIONS) ×4 IMPLANT
KIT TURNOVER KIT B (KITS) ×4 IMPLANT
KIT VASOVIEW HEMOPRO 2 VH 4000 (KITS) ×4 IMPLANT
NS IRRIG 1000ML POUR BTL (IV SOLUTION) ×22 IMPLANT
PACK E OPEN HEART (SUTURE) ×4 IMPLANT
PACK OPEN HEART (CUSTOM PROCEDURE TRAY) ×4 IMPLANT
PAD ARMBOARD 7.5X6 YLW CONV (MISCELLANEOUS) ×8 IMPLANT
PAD ELECT DEFIB RADIOL ZOLL (MISCELLANEOUS) ×4 IMPLANT
PENCIL BUTTON HOLSTER BLD 10FT (ELECTRODE) ×4 IMPLANT
PUNCH AORTIC ROTATE 4.0MM (MISCELLANEOUS) IMPLANT
PUNCH AORTIC ROTATE 4.5MM 8IN (MISCELLANEOUS) ×4 IMPLANT
PUNCH AORTIC ROTATE 5MM 8IN (MISCELLANEOUS) IMPLANT
SET CARDIOPLEGIA MPS 5001102 (MISCELLANEOUS) ×2 IMPLANT
SOLUTION ANTI FOG 6CC (MISCELLANEOUS) ×2 IMPLANT
SPONGE INTESTINAL PEANUT (DISPOSABLE) IMPLANT
SPONGE LAP 18X18 X RAY DECT (DISPOSABLE) IMPLANT
SPONGE LAP 4X18 RFD (DISPOSABLE) ×4 IMPLANT
STOPCOCK 4 WAY LG BORE MALE ST (IV SETS) ×2 IMPLANT
SUT BONE WAX W31G (SUTURE) ×4 IMPLANT
SUT MNCRL AB 4-0 PS2 18 (SUTURE) ×2 IMPLANT
SUT PROLENE 3 0 SH DA (SUTURE) IMPLANT
SUT PROLENE 3 0 SH1 36 (SUTURE) ×4 IMPLANT
SUT PROLENE 4 0 RB 1 (SUTURE) ×2
SUT PROLENE 4 0 SH DA (SUTURE) IMPLANT
SUT PROLENE 4-0 RB1 .5 CRCL 36 (SUTURE) IMPLANT
SUT PROLENE 5 0 C 1 36 (SUTURE) IMPLANT
SUT PROLENE 6 0 C 1 30 (SUTURE) IMPLANT
SUT PROLENE 7 0 BV 1 (SUTURE) ×4 IMPLANT
SUT PROLENE 7 0 BV1 MDA (SUTURE) ×4 IMPLANT
SUT PROLENE 8 0 BV175 6 (SUTURE) IMPLANT
SUT SILK  1 MH (SUTURE)
SUT SILK 1 MH (SUTURE) IMPLANT
SUT STEEL 6MS V (SUTURE) ×4 IMPLANT
SUT STEEL STERNAL CCS#1 18IN (SUTURE) IMPLANT
SUT STEEL SZ 6 DBL 3X14 BALL (SUTURE) IMPLANT
SUT VIC AB 1 CTX 36 (SUTURE) ×4
SUT VIC AB 1 CTX36XBRD ANBCTR (SUTURE) ×4 IMPLANT
SUT VIC AB 2-0 CT1 27 (SUTURE) ×2
SUT VIC AB 2-0 CT1 TAPERPNT 27 (SUTURE) IMPLANT
SUT VIC AB 2-0 CTX 27 (SUTURE) IMPLANT
SUT VIC AB 3-0 SH 27 (SUTURE)
SUT VIC AB 3-0 SH 27X BRD (SUTURE) IMPLANT
SUT VIC AB 3-0 X1 27 (SUTURE) IMPLANT
SUT VICRYL 4-0 PS2 18IN ABS (SUTURE) IMPLANT
SYSTEM SAHARA CHEST DRAIN ATS (WOUND CARE) ×4 IMPLANT
TAPE CLOTH SURG 4X10 WHT LF (GAUZE/BANDAGES/DRESSINGS) ×2 IMPLANT
TAPE PAPER 2X10 WHT MICROPORE (GAUZE/BANDAGES/DRESSINGS) ×2 IMPLANT
TOWEL GREEN STERILE (TOWEL DISPOSABLE) ×4 IMPLANT
TOWEL GREEN STERILE FF (TOWEL DISPOSABLE) ×4 IMPLANT
TRAY CATH LUMEN 1 20CM STRL (SET/KITS/TRAYS/PACK) ×2 IMPLANT
TRAY FOLEY SLVR 16FR TEMP STAT (SET/KITS/TRAYS/PACK) ×4 IMPLANT
TUBING ART PRESS 48 MALE/FEM (TUBING) ×4 IMPLANT
TUBING INSUFFLATION (TUBING) ×4 IMPLANT
UNDERPAD 30X30 (UNDERPADS AND DIAPERS) ×4 IMPLANT
WATER STERILE IRR 1000ML POUR (IV SOLUTION) ×8 IMPLANT

## 2018-08-29 NOTE — Anesthesia Preprocedure Evaluation (Addendum)
Anesthesia Evaluation  Patient identified by MRN, date of birth, ID band Patient awake    Reviewed: Allergy & Precautions, NPO status , Patient's Chart, lab work & pertinent test results  Airway Mallampati: II  TM Distance: >3 FB Neck ROM: Full    Dental no notable dental hx.    Pulmonary neg pulmonary ROS,    Pulmonary exam normal breath sounds clear to auscultation       Cardiovascular hypertension, + CAD, + Cardiac Stents and +CHF  Normal cardiovascular exam Rhythm:Regular Rate:Normal   Severe distal left main disease involving the ostium of both LAD and circumflex.  Mild in-stent restenosis in the LAD, circumflex and RCA stents with moderate disease beyond the stent in the LAD.  Echocardiographically severe Cardiomyopathy with Ejection Fraction of roughly 20%.    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  Severe restenosis of distal RCA stent.  Cardiology felt that CABG was her best revascularization option.  - TCTS input appreciated and are considering CABG on 11/4.  [place] 10/31 showed mean pulmonary capillary wedge pressure 19 mmHg, cardiac output 4.8 L/min and cardiac index 2.82.   Neuro/Psych negative neurological ROS  negative psych ROS   GI/Hepatic negative GI ROS, Neg liver ROS,   Endo/Other  diabetes  Renal/GU negative Renal ROS  negative genitourinary   Musculoskeletal negative musculoskeletal ROS (+)   Abdominal   Peds negative pediatric ROS (+)  Hematology negative hematology ROS (+)   Anesthesia Other Findings   Reproductive/Obstetrics negative OB ROS                            Anesthesia Physical Anesthesia Plan  ASA: IV  Anesthesia Plan: General   Post-op Pain Management:     Induction: Intravenous  PONV Risk Score and Plan: 0  Airway Management Planned: Oral ETT  Additional Equipment: Arterial line, CVP, PA Cath, TEE and Ultrasound Guidance Line Placement  Intra-op Plan:   Post-operative Plan: Post-operative intubation/ventilation  Informed Consent: I have reviewed the patients History and Physical, chart, labs and discussed the procedure including the risks, benefits and alternatives for the proposed anesthesia with the patient or authorized representative who has indicated his/her understanding and acceptance.   Dental advisory given  Plan Discussed with: CRNA and Surgeon  Anesthesia Plan Comments:         Anesthesia Quick Evaluation

## 2018-08-29 NOTE — Progress Notes (Signed)
Patient ID: Joy Boyd, female   DOB: 04-16-1951, 67 y.o.   MRN: [id number] EVENING ROUNDS NOTE :     [address]       [place] [address]             [phone]                 Day of Surgery Procedure(s) (LRB): CORONARY ARTERY BYPASS GRAFTING (CABG) x three, using left internal mammary artery and right leg greater saphenous vein harvested endoscopically (N/A) TRANSESOPHAGEAL ECHOCARDIOGRAM (TEE) (N/A)  Total Length of Stay:  LOS: 7 days  BP (!) 123/46   Pulse 62   Temp (!) 96.8 F (36 C)   Resp (!) 24   Ht 5\' 1"  (1.549 m)   Wt 69.9 kg   SpO2 96%   BMI 29.12 kg/m   .Intake/Output      11/03 0701 - 11/04 0700 11/04 0701 - 11/05 0700   P.O. 120    I.V. (mL/kg) 221 (3.2) 3061.1 (43.8)   Blood  675   IV Piggyback  788.9   Total Intake(mL/kg) 341 (4.9) 4525.1 (64.7)   Urine (mL/kg/hr)  1290 (1.9)   Blood  550   Chest Tube  34   Total Output  1874   Net +341 +2651.1        Urine Occurrence 3 x      . sodium chloride 20 mL/hr at 08/29/18 1600  . [START ON 08/30/2018] sodium chloride    . sodium chloride 10 mL/hr at 08/29/18 1433  . albumin human 12.5 g (08/29/18 1409)  . cefUROXime (ZINACEF)  IV Stopped (08/29/18 1522)  . dexmedetomidine (PRECEDEX) IV infusion 0.2 mcg/kg/hr (08/29/18 1600)  . DOPamine 3 mcg/kg/min (08/29/18 1600)  . epinephrine 1 mcg/min (08/29/18 1600)  . famotidine (PEPCID) IV Stopped (08/29/18 1436)  . insulin 0.8 mL/hr at 08/29/18 1600  . lactated ringers    . lactated ringers    . lactated ringers 20 mL/hr at 08/29/18 1600  . magnesium sulfate 20 mL/hr at 08/29/18 1600  . milrinone 0.3 mcg/kg/min (08/29/18 1608)  . nitroGLYCERIN Stopped (08/29/18 1445)  . norepinephrine (LEVOPHED) Adult infusion 3 mcg/min (08/29/18 1600)  . phenylephrine (NEO-SYNEPHRINE) Adult infusion Stopped (08/29/18 1445)  . vancomycin       Lab Results  Component Value Date   WBC 16.6 (H) 08/29/2018   HGB 8.2 (L) 08/29/2018   HCT 24.0 (L)  08/29/2018   PLT 158 08/29/2018   GLUCOSE 127 (H) 08/29/2018   CHOL 116 08/24/2018   TRIG 171 (H) 08/24/2018   HDL 31 (L) 08/24/2018   LDLCALC 51 08/24/2018   ALT 14 08/24/2018   AST 13 (L) 08/24/2018   NA 146 (H) 08/29/2018   K 4.2 08/29/2018   CL 111 08/29/2018   CREATININE 1.30 (H) 08/29/2018   BUN 25 (H) 08/29/2018   CO2 17 (L) 08/29/2018   INR 1.38 08/29/2018   HGBA1C 9.3 (H) 08/22/2018   Stable early postop Still on vent , just starting to wake up   Grace Isaac MD  Beeper [phone] Office [phone] 08/29/2018 4:50 PM

## 2018-08-29 NOTE — Progress Notes (Signed)
  Echocardiogram Echocardiogram Transesophageal has been performed.  Joy Boyd 08/29/2018, 10:37 AM

## 2018-08-29 NOTE — Progress Notes (Signed)
Arterial waveforms dampened and inconsistent. Medication titrated to cuff pressures. Dr. Cyndia Bent aware.

## 2018-08-29 NOTE — Plan of Care (Signed)
  Problem: Pain Managment: Goal: General experience of comfort will improve Outcome: Progressing   

## 2018-08-29 NOTE — Op Note (Addendum)
CARDIOVASCULAR SURGERY OPERATIVE NOTE  08/29/2018  Surgeon:  Gaye Pollack, MD  First Assistant: Nicholes Rough,  PA-C   Preoperative Diagnosis:  Severe multi-vessel coronary artery disease with severe ischemic cardiomyopathy   Postoperative Diagnosis:  Same   Procedure:  1. Median Sternotomy 2. Extracorporeal circulation 3.   Coronary artery bypass grafting x 3   Left internal mammary graft to the LAD  SVG to OM  SVG to PDA  4.   Endoscopic vein harvest from the right leg 5.   Insertion of left femoral arterial line   Anesthesia:  General Endotracheal   Clinical History/Surgical Indication:  The patient is a 67 year old [name] with a history of poorly controlled diabetes, chronic kidney disease with a creatinine of 1.5-1.7, hypertension, hyperlipidemia, acute on chronic systolic and diastolic congestive heart failure and multivessel coronary disease who underwent multiple stenting procedures in [place].  She reportedly had stenting of the LAD and RCA in 2013.  She apparently had further stents placed in the left circumflex and LAD in April 2019.  She has apparently had 7 stents total.  According to her son who lives in the [place] her left ventricular ejection fraction at that time was 45%.  She was admitted on 08/20/2018 with chest tightness.  She has had increasing shortness of breath and cough for about 1 month.  Troponin levels were all negative on admission.  Letter cardiogram showed sinus rhythm with frequent PVCs.  There is left ventricular hypertrophy with nonspecific ST and T wave changes.  BNP was elevated at 532.8.  2D echocardiogram on 08/21/2018 showed a left ventricular ejection fraction of 15 to 20% with diffuse hypokinesis.  There is grade 2 diastolic dysfunction.  There is mild right ventricular hypokinesis.  There is mild MR.  There is no aortic valve  disease.  Left heart catheterization was performed on 08/23/2018.  This showed an 85% distal left main stenosis extending into the ostium of the LAD.  There is about 75% ostial left circumflex stenosis.  There are patent stents in the left circumflex, LAD, and right coronary arteries.  The RCA has about 60% stenosis within the most distal stent.  LVEDP was 20 mmHg. She subsequently underwent right heart catheterization which showed mild pulmonary HTN with a PCW of 19 mm Hg and a CI of 2.82.  She has high-grade distal left main stenosis extending into the ostium of the LAD and left circumflex coronary arteries with ejection fraction 15 to 20% by echocardiogram, reportedly down from 45% in April at the time of her last stenting procedure.  This is most likely ischemic cardiomyopathy although there is some question about her frequent PVCs and if this could be causing a cardiomyopathy.  She presented with chest discomfort as well as congestive heart failure symptoms with an elevated BNP of 532 consistent with acute on chronic combined systolic and diastolic congestive heart failure.  She has poorly controlled diabetes as well as stage III chronic kidney disease with a creatinine of 1.5-1.7.  I think coronary bypass graft surgery is probably the best treatment for her although she is at high risk due to her low ejection fraction and comorbid risk factors.  It is unknown if her left ventricular systolic function will improve after revascularization and she could continue to have a low ejection fraction with congestive heart failure.  She could require a temporary left ventricular assist device postoperatively and is at high risk for postoperative renal failure and need for temporary or permanent  dialysis.  PCI may be an option although it would still be at high risk due to the anatomy and location of the stenosis with poor ejection fraction and would require Impella support.  Her long-term results with left main  stenting may not be very good with diabetes.  I discussed the surgical alternative with the patient and her son as interpreter as well as her husband. I discussed the operative procedure with the patient and family including alternatives, benefits and risks; including but not limited to bleeding, blood transfusion, infection, stroke, myocardial infarction, graft failure, heart block requiring a permanent pacemaker, organ dysfunction, and death.  Saraia Valone understands and agrees to proceed.    Preparation:  The patient was seen in the preoperative holding area and the correct patient, correct operation were confirmed with the patient after reviewing the medical record and catheterization. The consent was signed by me. Preoperative antibiotics were given. A pulmonary arterial line and radial arterial line were placed by the anesthesia team. The patient was taken back to the operating room and positioned supine on the operating room table. After being placed under general endotracheal anesthesia by the anesthesia team a foley catheter was placed. The neck, chest, abdomen, and both legs were prepped with betadine soap and solution and draped in the usual sterile manner. A surgical time-out was taken and the correct patient and operative procedure were confirmed with the nursing and anesthesia staff.   TEE: performed by Dr. Myrtie Soman  This showed severe LV systolic function with an EF of 25%. There was diffuse hypokinesis with septal akinesis.There was trivial MR.   Insertion of left femoral arterial line:  The right radial arterial line was difficult to insert and had a reasonable tracing but would not return blood. Therefore a left femoral arterial line was inserted using Seldinger technique. Ultrasound guidance was used to localize the vessel.   Cardiopulmonary Bypass:  A median sternotomy was performed. The pericardium was opened in the midline. Right ventricular function appeared normal. The  ascending aorta was of normal size and had no palpable plaque. There were no contraindications to aortic cannulation or cross-clamping. The patient was fully systemically heparinized and the ACT was maintained > 400 sec. The proximal aortic arch was cannulated with a 20 F aortic cannula for arterial inflow. Venous cannulation was performed via the right atrial appendage using a two-staged venous cannula. An antegrade cardioplegia/vent cannula was inserted into the mid-ascending aorta. A retrograde cardioplegia cannula was placed into the coronary sinus via the right atrium. Aortic occlusion was performed with a single cross-clamp. Systemic cooling to 32 degrees Centigrade and topical cooling of the heart with iced saline were used. Hyperkalemic antegrade cold blood cardioplegia was used to induce diastolic arrest and was then given at about 20 minute intervals throughout the period of arrest to maintain myocardial temperature at or below 10 degrees centigrade. There was rapid cooling of the heart to 10 degrees with antegrade cardioplegia alone so retrograde was not given. A temperature probe was inserted into the interventricular septum and an insulating pad was placed in the pericardium.   Left internal mammary harvest:  The left side of the sternum was retracted using the Rultract retractor. The left internal mammary artery was harvested as a pedicle graft. All side branches were clipped. It was a medium-sized vessel of good quality with excellent blood flow. It was ligated distally and divided. It was sprayed with topical papaverine solution to prevent vasospasm.   Endoscopic vein harvest:  The  right greater saphenous vein was harvested endoscopically through a 2 cm incision medial to the right knee. It was harvested from the upper thigh to below the knee. It was a medium-sized vein of good quality. The side branches were all ligated with 4-0 silk ties.    Coronary arteries:  The coronary arteries  were examined.   LAD:  Stents extended down to the mid vessel. The distal vessel was small and mildly diseased by suitable for grafting.  LCX:  The largest OM was grafted  RCA:  Stents extending out to to mid PDA. Just beyond this the PDA was small but could be grafted.   Grafts:  1. LIMA to the LAD: 1.6 mm. It was sewn end to side using 8-0 prolene continuous suture. 2. SVG to OM:  1.6 mm. It was sewn end to side using 7-0 prolene continuous suture. 3. SVG to PDA:  1.5 mm. It was sewn end to side using 7-0 prolene continuous suture.   The proximal vein graft anastomoses were performed to the mid-ascending aorta using continuous 6-0 prolene suture. Graft markers were placed around the proximal anastomoses.   Completion:  The patient was rewarmed to 37 degrees Centigrade. The clamp was removed from the LIMA pedicle and there was rapid warming of the septum and return of ventricular fibrillation. The crossclamp was removed with a time of 65 minutes. There was spontaneous return of sinus rhythm. The distal and proximal anastomoses were checked for hemostasis. The position of the grafts was satisfactory. Two temporary epicardial pacing wires were placed on the right atrium and two on the right ventricle. The patient was weaned from CPB on milrinone 0.375, dopamine 3, epi 2, levophed 3. CPB time was 116 minutes. Cardiac output was 3.6 LPM. TEE showed improved LV systolic function.  Heparin was fully reversed with protamine and the aortic and venous cannulas removed. Hemostasis was achieved. Mediastinal and left pleural drainage tubes were placed. The sternum was closed with  #6 stainless steel wires. The fascia was closed with continuous # 1 vicryl suture. The subcutaneous tissue was closed with 2-0 vicryl continuous suture. The skin was closed with 3-0 vicryl subcuticular suture. All sponge, needle, and instrument counts were reported correct at the end of the case. Dry sterile dressings were  placed over the incisions and around the chest tubes which were connected to pleurevac suction. The patient was then transported to the surgical intensive care unit in critical but stable condition.

## 2018-08-29 NOTE — Anesthesia Procedure Notes (Signed)
Central Venous Catheter Insertion Performed by: Duane Boston, MD, anesthesiologist Start/End11/01/2018 6:47 AM, 08/29/2018 6:57 AM Patient location: Pre-op. Preanesthetic checklist: patient identified, IV checked, site marked, risks and benefits discussed, surgical consent, monitors and equipment checked, pre-op evaluation, timeout performed and anesthesia consent Position: Trendelenburg Lidocaine 1% used for infiltration and patient sedated Hand hygiene performed , maximum sterile barriers used  and Seldinger technique used Catheter size: 8.5 Fr Total catheter length 8. PA cath was placed.Sheath introducer Swan type:thermodilution PA Cath depth:50 Procedure performed using ultrasound guided technique. Ultrasound Notes:anatomy identified, needle tip was noted to be adjacent to the nerve/plexus identified, no ultrasound evidence of intravascular and/or intraneural injection and image(s) printed for medical record Attempts: 1 Following insertion, line sutured and dressing applied. Post procedure assessment: free fluid flow, blood return through all ports and no air  Patient tolerated the procedure well with no immediate complications.

## 2018-08-29 NOTE — Anesthesia Procedure Notes (Signed)
Arterial Line Insertion Start/End11/01/2018 8:43 AM Performed by: attending  Preanesthetic checklist: patient identified, IV checked, site marked, risks and benefits discussed, surgical consent, monitors and equipment checked, pre-op evaluation, timeout performed and anesthesia consent Left, femoral was placed Hand hygiene performed  and maximum sterile barriers used   Procedure performed using ultrasound guided technique. Ultrasound Notes:anatomy identified, needle tip was noted to be adjacent to the nerve/plexus identified and no ultrasound evidence of intravascular and/or intraneural injection Following insertion, line sutured and dressing applied. Post procedure assessment: normal  Patient tolerated the procedure well with no immediate complications.

## 2018-08-29 NOTE — Brief Op Note (Signed)
08/20/2018 - 08/29/2018  2:01 PM  PATIENT:  Joy Boyd  67 y.o. female  PRE-OPERATIVE DIAGNOSIS:  CAD LEFT MAIN DISEASE  POST-OPERATIVE DIAGNOSIS:  CAD LEFT MAIN DISEASE  PROCEDURE:  Procedure(s): CORONARY ARTERY BYPASS GRAFTING (CABG) x three, using left internal mammary artery and right leg greater saphenous vein harvested endoscopically (N/A) TRANSESOPHAGEAL ECHOCARDIOGRAM (TEE) (N/A)  SURGEON:  Surgeon(s) and Role:    * Bartle, Fernande Boyden, MD - Primary  PHYSICIAN ASSISTANT:  Nicholes Rough, PA-C   ANESTHESIA:   general  EBL:  550 mL   BLOOD ADMINISTERED:none  DRAINS: ROUTINE   LOCAL MEDICATIONS USED:  NONE  SPECIMEN:  No Specimen  DISPOSITION OF SPECIMEN:  N/A  COUNTS:  YES  DICTATION: .Dragon Dictation  PLAN OF CARE: Admit to inpatient   PATIENT DISPOSITION:  ICU - intubated and hemodynamically stable.   Delay start of Pharmacological VTE agent (>24hrs) due to surgical blood loss or risk of bleeding: yes

## 2018-08-29 NOTE — Anesthesia Procedure Notes (Signed)
Arterial Line Insertion Start/End11/01/2018 6:40 AM, 08/29/2018 7:15 AM Performed by: Laretta Alstrom, CRNA, CRNA  Patient location: Pre-op. Preanesthetic checklist: patient identified, IV checked, site marked, risks and benefits discussed, surgical consent, monitors and equipment checked, pre-op evaluation, timeout performed and anesthesia consent Lidocaine 1% used for infiltration Right, radial was placed Catheter size: 20 G Maximum sterile barriers used   Attempts: 2 Procedure performed without using ultrasound guided technique. Following insertion, dressing applied and Biopatch. Post procedure assessment: normal  Patient tolerated the procedure well with no immediate complications. Additional procedure comments: Attempted on pt left side first, unable to pass wire easily and no flow back with cath placed. Delphia Grates, CRNA then attempted on left side, was unable to get flow back. Right side achieved on first attempt, will transduce but hard to draw back sample on. Dr Cyndia Bent aware, will place femoral after asleep.tb.

## 2018-08-29 NOTE — Progress Notes (Signed)
Patient unable to do NIF and FVC parameters at this time. RT placed patient back on SIMV mode and will attempt to wean at a later time. RN is aware.

## 2018-08-29 NOTE — OR Nursing (Signed)
12:25 - 45 minute call to SICU charge nurse 13:00 - 20 minute call to SICU charge nurse

## 2018-08-29 NOTE — Transfer of Care (Signed)
Immediate Anesthesia Transfer of Care Note  Patient: Joy Boyd  Procedure(s) Performed: CORONARY ARTERY BYPASS GRAFTING (CABG) x three, using left internal mammary artery and right leg greater saphenous vein harvested endoscopically (N/A Chest) TRANSESOPHAGEAL ECHOCARDIOGRAM (TEE) (N/A )  Patient Location: SICU  Anesthesia Type:General  Level of Consciousness: sedated, unresponsive and Patient remains intubated per anesthesia plan  Airway & Oxygen Therapy: Patient remains intubated per anesthesia plan and Patient placed on Ventilator (see vital sign flow sheet for setting)  Post-op Assessment: Report given to RN and Post -op Vital signs reviewed and stable  Post vital signs: Reviewed and stable  Last Vitals:  Vitals Value Taken Time  BP    Temp    Pulse 85 08/29/2018  1:52 PM  Resp 15 08/29/2018  1:52 PM  SpO2 96 % 08/29/2018  1:52 PM  Vitals shown include unvalidated device data.  Last Pain:  Vitals:   08/29/18 0546  TempSrc: Oral  PainSc: 0-No pain      Patients Stated Pain Goal: 0 (71/25/27 1292)  Complications: No apparent anesthesia complications

## 2018-08-29 NOTE — Progress Notes (Signed)
TRH Sign off note  Patient was already in the OR by the time I came in this morning. She is now in the ICU post CABG, sedated and intubated. I visited her in room and she is stable per her RN.  Since TCTS takes over all care post CABG, TRH will sign off at this time. Please consult TRH again for any further assistance.  Vernell Leep, MD, FACP, [facility]. [facility] Pager [phone]  If 7PM-7AM, please contact night-coverage [email or web address] Password [facility] 08/29/2018, 3:39 PM

## 2018-08-30 ENCOUNTER — Encounter (HOSPITAL_COMMUNITY): Payer: Self-pay | Admitting: Surgery

## 2018-08-30 ENCOUNTER — Inpatient Hospital Stay (HOSPITAL_COMMUNITY): Payer: Medicaid Other

## 2018-08-30 LAB — BPAM PLATELET PHERESIS
BLOOD PRODUCT EXPIRATION DATE: 201911052359
ISSUE DATE / TIME: 201911041137
UNIT TYPE AND RH: 6200

## 2018-08-30 LAB — CBC
HEMATOCRIT: 25.9 % — AB (ref 36.0–46.0)
HEMATOCRIT: 29.1 % — AB (ref 36.0–46.0)
HEMOGLOBIN: 8.4 g/dL — AB (ref 12.0–15.0)
Hemoglobin: 7.7 g/dL — ABNORMAL LOW (ref 12.0–15.0)
MCH: 26.6 pg (ref 26.0–34.0)
MCH: 26 pg (ref 26.0–34.0)
MCHC: 29.7 g/dL — ABNORMAL LOW (ref 30.0–36.0)
MCHC: 28.9 g/dL — ABNORMAL LOW (ref 30.0–36.0)
MCV: 89.3 fL (ref 80.0–100.0)
MCV: 90.1 fL (ref 80.0–100.0)
NRBC: 0 % (ref 0.0–0.2)
NRBC: 0 % (ref 0.0–0.2)
PLATELETS: 125 10*3/uL — AB (ref 150–400)
Platelets: 136 10*3/uL — ABNORMAL LOW (ref 150–400)
RBC: 2.9 MIL/uL — AB (ref 3.87–5.11)
RBC: 3.23 MIL/uL — ABNORMAL LOW (ref 3.87–5.11)
RDW: 14.7 % (ref 11.5–15.5)
RDW: 14.8 % (ref 11.5–15.5)
WBC: 12 10*3/uL — ABNORMAL HIGH (ref 4.0–10.5)
WBC: 14 10*3/uL — AB (ref 4.0–10.5)

## 2018-08-30 LAB — POCT I-STAT 3, ART BLOOD GAS (G3+)
Acid-base deficit: 2 mmol/L (ref 0.0–2.0)
Bicarbonate: 23.3 mmol/L (ref 20.0–28.0)
O2 Saturation: 97 %
PH ART: 7.342 — AB (ref 7.350–7.450)
TCO2: 25 mmol/L (ref 22–32)
pCO2 arterial: 43 mmHg (ref 32.0–48.0)
pO2, Arterial: 99 mmHg (ref 83.0–108.0)

## 2018-08-30 LAB — BASIC METABOLIC PANEL
ANION GAP: 7 (ref 5–15)
BUN: 20 mg/dL (ref 8–23)
CALCIUM: 8 mg/dL — AB (ref 8.9–10.3)
CO2: 24 mmol/L (ref 22–32)
Chloride: 112 mmol/L — ABNORMAL HIGH (ref 98–111)
Creatinine, Ser: 1.66 mg/dL — ABNORMAL HIGH (ref 0.44–1.00)
GFR, EST AFRICAN AMERICAN: 36 mL/min — AB (ref >60)
GFR, EST NON AFRICAN AMERICAN: 31 mL/min — AB (ref >60)
GLUCOSE: 144 mg/dL — AB (ref 70–99)
POTASSIUM: 4.6 mmol/L (ref 3.5–5.1)
Sodium: 143 mmol/L (ref 135–145)

## 2018-08-30 LAB — POCT I-STAT, CHEM 8
BUN: 24 mg/dL — ABNORMAL HIGH (ref 8–23)
CALCIUM ION: 1.25 mmol/L (ref 1.15–1.40)
CHLORIDE: 110 mmol/L (ref 98–111)
CREATININE: 2 mg/dL — AB (ref 0.44–1.00)
GLUCOSE: 128 mg/dL — AB (ref 70–99)
HCT: 26 % — ABNORMAL LOW (ref 36.0–46.0)
Hemoglobin: 8.8 g/dL — ABNORMAL LOW (ref 12.0–15.0)
Potassium: 5 mmol/L (ref 3.5–5.1)
Sodium: 143 mmol/L (ref 135–145)
TCO2: 25 mmol/L (ref 22–32)

## 2018-08-30 LAB — PREPARE PLATELET PHERESIS: UNIT DIVISION: 0

## 2018-08-30 LAB — GLUCOSE, CAPILLARY
GLUCOSE-CAPILLARY: 77 mg/dL (ref 70–99)
GLUCOSE-CAPILLARY: 80 mg/dL (ref 70–99)
GLUCOSE-CAPILLARY: 119 mg/dL — AB (ref 70–99)
GLUCOSE-CAPILLARY: 117 mg/dL — AB (ref 70–99)
GLUCOSE-CAPILLARY: 85 mg/dL (ref 70–99)
Glucose-Capillary: 107 mg/dL — ABNORMAL HIGH (ref 70–99)
Glucose-Capillary: 119 mg/dL — ABNORMAL HIGH (ref 70–99)
Glucose-Capillary: 121 mg/dL — ABNORMAL HIGH (ref 70–99)
Glucose-Capillary: 126 mg/dL — ABNORMAL HIGH (ref 70–99)
Glucose-Capillary: 85 mg/dL (ref 70–99)
Glucose-Capillary: 94 mg/dL (ref 70–99)

## 2018-08-30 LAB — CREATININE, SERUM
Creatinine, Ser: 1.98 mg/dL — ABNORMAL HIGH (ref 0.44–1.00)
GFR, EST AFRICAN AMERICAN: 29 mL/min — AB (ref >60)
GFR, EST NON AFRICAN AMERICAN: 25 mL/min — AB (ref >60)

## 2018-08-30 LAB — MAGNESIUM
Magnesium: 2.5 mg/dL — ABNORMAL HIGH (ref 1.7–2.4)
Magnesium: 2.8 mg/dL — ABNORMAL HIGH (ref 1.7–2.4)

## 2018-08-30 MED ORDER — INSULIN DETEMIR 100 UNIT/ML ~~LOC~~ SOLN: 15.0000 [IU] | Freq: Every day | SUBCUTANEOUS | Status: DC

## 2018-08-30 MED ORDER — ENOXAPARIN SODIUM 40 MG/0.4ML ~~LOC~~ SOLN
40.0000 mg | Freq: Every day | SUBCUTANEOUS | Status: DC
  Administered 2018-08-30 – 2018-09-09 (×11): 40 mg via SUBCUTANEOUS
  Filled 2018-08-30 (×11): qty 0.4

## 2018-08-30 MED ORDER — TRAMADOL HCL 50 MG PO TABS
50.0000 mg | ORAL_TABLET | Freq: Four times a day (QID) | ORAL | Status: DC | PRN
  Administered 2018-08-31: 50 mg via ORAL
  Filled 2018-08-30: qty 1

## 2018-08-30 MED ORDER — METOCLOPRAMIDE HCL 5 MG/ML IJ SOLN
5.0000 mg | Freq: Four times a day (QID) | INTRAMUSCULAR | Status: DC
  Administered 2018-08-30 – 2018-08-31 (×6): 5 mg via INTRAVENOUS
  Filled 2018-08-30 (×7): qty 2

## 2018-08-30 MED ORDER — INSULIN ASPART 100 UNIT/ML ~~LOC~~ SOLN
0.0000 [IU] | SUBCUTANEOUS | Status: DC
  Administered 2018-08-30 – 2018-09-01 (×2): 2 [IU] via SUBCUTANEOUS
  Administered 2018-09-01 (×2): 4 [IU] via SUBCUTANEOUS

## 2018-08-30 MED ORDER — TRAMADOL HCL 50 MG PO TABS: 50.0000 mg | ORAL_TABLET | ORAL | Status: DC | PRN

## 2018-08-30 MED ORDER — MORPHINE SULFATE (PF) 2 MG/ML IV SOLN: 1.0000 mg | INTRAVENOUS | Status: DC | PRN

## 2018-08-30 MED ORDER — FENTANYL CITRATE (PF) 100 MCG/2ML IJ SOLN: 25.0000 ug | INTRAMUSCULAR | Status: DC | PRN

## 2018-08-30 MED ORDER — ORAL CARE MOUTH RINSE
15.0000 mL | Freq: Two times a day (BID) | OROMUCOSAL | Status: DC
  Administered 2018-08-30: 15 mL via OROMUCOSAL

## 2018-08-30 MED ORDER — OXYCODONE HCL 5 MG PO TABS
5.0000 mg | ORAL_TABLET | ORAL | Status: DC | PRN
  Administered 2018-08-30: 5 mg via ORAL
  Filled 2018-08-30: qty 1

## 2018-08-30 MED ORDER — MILRINONE LACTATE IN DEXTROSE 20-5 MG/100ML-% IV SOLN: 0.3000 ug/kg/min | INTRAVENOUS | Status: DC

## 2018-08-30 MED ORDER — INSULIN DETEMIR 100 UNIT/ML ~~LOC~~ SOLN
10.0000 [IU] | Freq: Every day | SUBCUTANEOUS | Status: DC
  Administered 2018-08-30 – 2018-09-04 (×6): 10 [IU] via SUBCUTANEOUS
  Filled 2018-08-30 (×6): qty 0.1

## 2018-08-30 MED FILL — Sodium Chloride IV Soln 0.9%: INTRAVENOUS | Qty: 2000 | Status: AC

## 2018-08-30 MED FILL — Lidocaine HCl(Cardiac) IV PF Soln Pref Syr 100 MG/5ML (2%): INTRAVENOUS | Qty: 5 | Status: AC

## 2018-08-30 MED FILL — Electrolyte-R (PH 7.4) Solution: INTRAVENOUS | Qty: 3000 | Status: AC

## 2018-08-30 MED FILL — Heparin Sodium (Porcine) Inj 1000 Unit/ML: INTRAMUSCULAR | Qty: 10 | Status: AC

## 2018-08-30 MED FILL — Sodium Bicarbonate IV Soln 8.4%: INTRAVENOUS | Qty: 50 | Status: AC

## 2018-08-30 MED FILL — Mannitol IV Soln 20%: INTRAVENOUS | Qty: 500 | Status: AC

## 2018-08-30 NOTE — Plan of Care (Signed)
Patient with frequent c/o nausea, treating with zofran IV, patient taking sips and chips only. Family very supportive especially with translating to patient.

## 2018-08-30 NOTE — Procedures (Signed)
Extubation Procedure Note  Patient Details:   Name: Joy Boyd DOB: 1951/07/15 MRN: [id number]   Airway Documentation:    Vent end date: 08/30/18 Vent end time: 0007   Evaluation  O2 sats: stable throughout Complications: No apparent complications Patient did tolerate procedure well. Bilateral Breath Sounds: Clear   Yes   Patient did NIF -24 and FVC 1.1L. RT extubated patient to 4L [place]. Patient tolerated well and did 225ml on incentive spirometer.  Patsy Baltimore Leena Tiede 08/30/2018, 12:08 AM

## 2018-08-30 NOTE — Progress Notes (Signed)
Patient ID: Joy Boyd, female   DOB: 08/21/1951, 67 y.o.   MRN: [id number] TCTS Evening Rounds:  Hemodynamically stable off milrinone. Still on dopamine 3 mcg for renal perfusion.  Urine output ok. Creat increased to 2.0 this pm.  Nausea all day. She is on Reglan and Zofran.  BMET    Component Value Date/Time   NA 143 08/30/2018 1523   K 5.0 08/30/2018 1523   CL 110 08/30/2018 1523   CO2 24 08/30/2018 0450   GLUCOSE 128 (H) 08/30/2018 1523   BUN 24 (H) 08/30/2018 1523   CREATININE 2.00 (H) 08/30/2018 1523   CALCIUM 8.0 (L) 08/30/2018 0450   GFRNONAA 25 (L) 08/30/2018 1513   GFRAA 29 (L) 08/30/2018 1513   CBC    Component Value Date/Time   WBC 14.0 (H) 08/30/2018 1513   RBC 3.23 (L) 08/30/2018 1513   HGB 8.8 (L) 08/30/2018 1523   HCT 26.0 (L) 08/30/2018 1523   PLT 136 (L) 08/30/2018 1513   MCV 90.1 08/30/2018 1513   MCH 26.0 08/30/2018 1513   MCHC 28.9 (L) 08/30/2018 1513   RDW 14.8 08/30/2018 1513   LYMPHSABS 0.8 08/29/2018 1834   MONOABS 1.0 08/29/2018 1834   EOSABS 0.2 08/29/2018 1834   BASOSABS 0.0 08/29/2018 1834   Sat up a little today and walked a few steps. Mostly in bed with eyes closed and not interacting.

## 2018-08-30 NOTE — Plan of Care (Signed)
  Problem: Nutrition: Goal: Adequate nutrition will be maintained Outcome: Not Progressing   

## 2018-08-30 NOTE — Plan of Care (Signed)
  Problem: Clinical Measurements: Goal: Ability to maintain clinical measurements within normal limits will improve Outcome: Progressing Goal: Diagnostic test results will improve Outcome: Progressing Goal: Respiratory complications will improve Outcome: Progressing   Problem: Activity: Goal: Risk for activity intolerance will decrease Outcome: Progressing   

## 2018-08-30 NOTE — Plan of Care (Signed)
Family instructed to let pt's bowel wake up after surgery...caught them many times spooning in a lot of fluid. Patient has been nauseated all day, vomiting frequently. Explained she can have some chips and sips when she feels better.Patient is burping a lot of air. Will continue to monitor.

## 2018-08-30 NOTE — Plan of Care (Signed)
Patient vomited large amount clear liquid, burping a lot of air. Family encouraged not to give patient anything by mouth for a few hours.

## 2018-08-30 NOTE — Progress Notes (Addendum)
T[facility] DAILY ICU PROGRESS NOTE                   [address]            [place] [address]          [phone]   1 Day Post-Op Procedure(s) (LRB): CORONARY ARTERY BYPASS GRAFTING (CABG) x three, using left internal mammary artery and right leg greater saphenous vein harvested endoscopically (N/A) TRANSESOPHAGEAL ECHOCARDIOGRAM (TEE) (N/A)  Total Length of Stay:  LOS: 8 days   Subjective: Feels ok, fairly weak   Objective: Vital signs in last 24 hours: Temp:  [96.4 F (35.8 C)-99.3 F (37.4 C)] 97.7 F (36.5 C) (11/05 0740) Pulse Rate:  [41-93] 93 (11/05 0817) Cardiac Rhythm: Normal sinus rhythm (11/05 0817) Resp:  [11-38] 16 (11/05 0817) BP: (96-161)/(41-68) 141/54 (11/05 0730) SpO2:  [91 %-100 %] 100 % (11/05 0817) Arterial Line BP: (77-151)/(33-61) 151/59 (11/05 0740) FiO2 (%):  [40 %-50 %] 40 % (11/04 2310) Weight:  [73.8 kg] 73.8 kg (11/05 0500)  Filed Weights   08/28/18 0500 08/29/18 0546 08/30/18 0500  Weight: 69.6 kg 69.9 kg 73.8 kg    Weight change: 3.901 kg   Hemodynamic parameters for last 24 hours: PAP: (29-43)/(10-21) 31/10 CO:  [3.3 L/min-5 L/min] 4.9 L/min CI:  [1.9 L/min/m2-3 L/min/m2] 2.9 L/min/m2  Intake/Output from previous day: 11/04 0701 - 11/05 0700 In: 6026.6 [I.V.:3899.5; Blood:675; IV Piggyback:1452.1] Out: 8921 [Urine:2690; Blood:550; Chest Tube:428]  Intake/Output this shift: Total I/O In: 50.2 [I.V.:50.2] Out: -   Current Meds: Scheduled Meds: . acetaminophen  1,000 mg Oral Q6H   Or  . acetaminophen (TYLENOL) oral liquid 160 mg/5 mL  1,000 mg Per Tube Q6H  . aspirin EC  325 mg Oral Daily   Or  . aspirin  324 mg Per Tube Daily  . atorvastatin  80 mg Oral Daily  . bisacodyl  10 mg Oral Daily   Or  . bisacodyl  10 mg Rectal Daily  . budesonide  0.25 mg Inhalation BID  . Chlorhexidine Gluconate Cloth  6 each Topical Daily  . docusate sodium  200 mg Oral Daily  . insulin aspart  0-24 Units Subcutaneous Q4H    . metoCLOPramide (REGLAN) injection  5 mg Intravenous Q6H  . metoprolol tartrate  12.5 mg Oral BID   Or  . metoprolol tartrate  12.5 mg Per Tube BID  . [START ON 08/31/2018] pantoprazole  40 mg Oral Daily  . sodium chloride flush  10-40 mL Intracatheter Q12H  . sodium chloride flush  3 mL Intravenous Q12H   Continuous Infusions: . sodium chloride 20 mL/hr at 08/30/18 0800  . sodium chloride    . sodium chloride 10 mL/hr at 08/29/18 1433  . albumin human 12.5 g (08/30/18 0122)  . cefUROXime (ZINACEF)  IV Stopped (08/30/18 0417)  . dexmedetomidine (PRECEDEX) IV infusion Stopped (08/29/18 1623)  . DOPamine 3 mcg/kg/min (08/30/18 0800)  . epinephrine Stopped (08/29/18 1702)  . lactated ringers    . lactated ringers    . lactated ringers 20 mL/hr at 08/30/18 0800  . milrinone    . nitroGLYCERIN Stopped (08/29/18 1445)  . norepinephrine (LEVOPHED) Adult infusion Stopped (08/30/18 1941)  . phenylephrine (NEO-SYNEPHRINE) Adult infusion Stopped (08/29/18 1445)   PRN Meds:.sodium chloride, albumin human, lactated ringers, metoprolol tartrate, midazolam, morphine injection, ondansetron (ZOFRAN) IV, oxyCODONE, sodium chloride flush, sodium chloride flush, traMADol  General appearance: alert, cooperative and no distress Heart: regular rate and rhythm  and occas extrasystole Lungs: clear to auscultation bilaterally and , anteriorly Abdomen: soft, nontender Extremities: no sign edema Wound: dressings CDI  Lab Results: CBC: Recent Labs    08/29/18 1834 08/29/18 1850 08/30/18 0450  WBC 15.2*  --  12.0*  HGB 9.3* 9.2* 7.7*  HCT 29.8* 27.0* 25.9*  PLT 137*  --  125*   BMET:  Recent Labs    08/29/18 0520  08/29/18 1850 08/30/18 0450  NA 139   < > 143 143  K 5.5*   < > 4.5 4.6  CL 114*   < > 110 112*  CO2 17*  --   --  24  GLUCOSE 169*   < > 154* 144*  BUN 27*   < > 23 20  CREATININE 1.52*   < > 1.50* 1.66*  CALCIUM 9.4  --   --  8.0*   < > = values in this interval not  displayed.    CMET: Lab Results  Component Value Date   WBC 12.0 (H) 08/30/2018   HGB 7.7 (L) 08/30/2018   HCT 25.9 (L) 08/30/2018   PLT 125 (L) 08/30/2018   GLUCOSE 144 (H) 08/30/2018   CHOL 116 08/24/2018   TRIG 171 (H) 08/24/2018   HDL 31 (L) 08/24/2018   LDLCALC 51 08/24/2018   ALT 14 08/24/2018   AST 13 (L) 08/24/2018   NA 143 08/30/2018   K 4.6 08/30/2018   CL 112 (H) 08/30/2018   CREATININE 1.66 (H) 08/30/2018   BUN 20 08/30/2018   CO2 24 08/30/2018   INR 1.38 08/29/2018   HGBA1C 9.3 (H) 08/22/2018   .ekg  PT/INR:  Recent Labs    08/29/18 1340  LABPROT 16.8*  INR 1.38   Radiology: Dg Chest Port 1 View  Result Date: 08/30/2018 CLINICAL DATA:  Chest tube present.  Cardiac surgery EXAM: PORTABLE CHEST 1 VIEW COMPARISON:  Yesterday FINDINGS: Interval tracheal and esophageal extubation. Swan-Ganz catheter from the right, tip at the right main pulmonary artery. Thoracic drains present. Atelectasis that is mildly improved behind the heart. No visible pneumothorax. Stable cardiomegaly. IMPRESSION: 1. Remaining hardware in similar position. 2. Atelectasis with improved aeration since yesterday. 3. No visible pneumothorax. Electronically Signed   By: Monte Fantasia M.D.   On: 08/30/2018 07:07   Dg Chest Port 1 View  Result Date: 08/29/2018 CLINICAL DATA:  CABG EXAM: PORTABLE CHEST 1 VIEW COMPARISON:  08/28/2018 FINDINGS: Endotracheal tube tip is 1.7 cm from the carina. NG tube is coiled in the body of the stomach. Right jugular introducer and Swan-Ganz catheter is in place. Tip is in the main pulmonary artery. Multiple chest and mediastinal tubes are in place. There is no pneumothorax. Low volumes and bibasilar atelectasis. No sign of pulmonary edema or vascular congestion. IMPRESSION: Postoperative chest radiograph. Support apparatus is appropriately positioned. No evidence of pneumothorax or CHF. Electronically Signed   By: Marybelle Killings M.D.   On: 08/29/2018 14:24      Assessment/Plan: S/P Procedure(s) (LRB): CORONARY ARTERY BYPASS GRAFTING (CABG) x three, using left internal mammary artery and right leg greater saphenous vein harvested endoscopically (N/A) TRANSESOPHAGEAL ECHOCARDIOGRAM (TEE) (N/A)  1 steady progress 2 BP stable with good cardiac indices, PA pressures. Sinus rhythm with PVC's- chronic Wean milrinone off, cont dopamine as creat/GFR a little worse than baseline. Making good UOP, may need diuretic.  3 CT drainage - minor-, d/c tubes 4 d/c art lines and sganz (after milrinone off) 5 some nausea- reglan ordered 6 ABL anemia-  not in transfusion threshold- cont to monitor 7 minor leukocytosis-no fevers,  Monitor, prob systemic inflammatory response 8 thrombocytopenia- trend, no HIT panel required yet 9 routine pulm toilet and activity progression 10 sugars adeq controlled- SSI , poor control at home with A1C 9.3 which correlates with an average Blood glucose of 250 so will need better long term management [facility] 08/30/2018 8:28 AM  Pager [phone]    Chart reviewed, patient examined, agree with above. She has been hemodynamically stable and weaning off inotropes. Preop EF 25% by TEE in OR.  Stage 3 CKD: creat stable at baseline. Observe.

## 2018-08-31 ENCOUNTER — Inpatient Hospital Stay (HOSPITAL_COMMUNITY): Payer: Medicaid Other

## 2018-08-31 LAB — BASIC METABOLIC PANEL
Anion gap: 4 — ABNORMAL LOW (ref 5–15)
BUN: 24 mg/dL — AB (ref 8–23)
CALCIUM: 8.6 mg/dL — AB (ref 8.9–10.3)
CO2: 26 mmol/L (ref 22–32)
Chloride: 115 mmol/L — ABNORMAL HIGH (ref 98–111)
Creatinine, Ser: 1.94 mg/dL — ABNORMAL HIGH (ref 0.44–1.00)
GFR calc non Af Amer: 26 mL/min — ABNORMAL LOW (ref >60)
GFR, EST AFRICAN AMERICAN: 30 mL/min — AB (ref >60)
Glucose, Bld: 165 mg/dL — ABNORMAL HIGH (ref 70–99)
Potassium: 4.7 mmol/L (ref 3.5–5.1)
SODIUM: 145 mmol/L (ref 135–145)

## 2018-08-31 LAB — GLUCOSE, CAPILLARY
GLUCOSE-CAPILLARY: 156 mg/dL — AB (ref 70–99)
Glucose-Capillary: 58 mg/dL — ABNORMAL LOW (ref 70–99)
Glucose-Capillary: 98 mg/dL (ref 70–99)
Glucose-Capillary: 98 mg/dL (ref 70–99)
Glucose-Capillary: 79 mg/dL (ref 70–99)
Glucose-Capillary: 85 mg/dL (ref 70–99)

## 2018-08-31 LAB — COOXEMETRY PANEL
CARBOXYHEMOGLOBIN: 0 % — AB (ref 0.5–1.5)
Methemoglobin: 1.8 % — ABNORMAL HIGH (ref 0.0–1.5)
O2 SAT: 75.1 %
Total hemoglobin: 8.6 g/dL — ABNORMAL LOW (ref 12.0–16.0)

## 2018-08-31 LAB — CBC
HCT: 27.9 % — ABNORMAL LOW (ref 36.0–46.0)
Hemoglobin: 8.3 g/dL — ABNORMAL LOW (ref 12.0–15.0)
MCH: 27.1 pg (ref 26.0–34.0)
MCHC: 29.7 g/dL — AB (ref 30.0–36.0)
MCV: 91.2 fL (ref 80.0–100.0)
NRBC: 0 % (ref 0.0–0.2)
PLATELETS: 127 10*3/uL — AB (ref 150–400)
RBC: 3.06 MIL/uL — ABNORMAL LOW (ref 3.87–5.11)
RDW: 15 % (ref 11.5–15.5)
WBC: 11.4 10*3/uL — AB (ref 4.0–10.5)

## 2018-08-31 MED ORDER — ASPIRIN EC 81 MG PO TBEC
81.0000 mg | DELAYED_RELEASE_TABLET | Freq: Every day | ORAL | Status: DC
  Administered 2018-08-31 – 2018-09-22 (×23): 81 mg via ORAL
  Filled 2018-08-31 (×23): qty 1

## 2018-08-31 MED ORDER — CARVEDILOL 3.125 MG PO TABS
3.1250 mg | ORAL_TABLET | Freq: Two times a day (BID) | ORAL | Status: DC
  Administered 2018-08-31 – 2018-09-01 (×3): 3.125 mg via ORAL
  Filled 2018-08-31 (×3): qty 1

## 2018-08-31 MED ORDER — DEXTROSE 50 % IV SOLN
1.0000 | Freq: Once | INTRAVENOUS | Status: AC
  Administered 2018-08-31: 50 mL via INTRAVENOUS
  Filled 2018-08-31: qty 50

## 2018-08-31 MED ORDER — CLOPIDOGREL BISULFATE 75 MG PO TABS
75.0000 mg | ORAL_TABLET | Freq: Every day | ORAL | Status: DC
  Administered 2018-08-31 – 2018-09-22 (×23): 75 mg via ORAL
  Filled 2018-08-31 (×23): qty 1

## 2018-08-31 MED ORDER — METOLAZONE 5 MG PO TABS
5.0000 mg | ORAL_TABLET | Freq: Once | ORAL | Status: AC
  Administered 2018-08-31: 5 mg via ORAL
  Filled 2018-08-31: qty 1

## 2018-08-31 MED ORDER — FUROSEMIDE 10 MG/ML IJ SOLN
80.0000 mg | Freq: Two times a day (BID) | INTRAMUSCULAR | Status: AC
  Administered 2018-08-31 (×2): 80 mg via INTRAVENOUS
  Filled 2018-08-31 (×2): qty 8

## 2018-08-31 NOTE — Progress Notes (Signed)
TCTS BRIEF SICU PROGRESS NOTE  2 Days Post-Op  S/P Procedure(s) (LRB): CORONARY ARTERY BYPASS GRAFTING (CABG) x three, using left internal mammary artery and right leg greater saphenous vein harvested endoscopically (N/A) TRANSESOPHAGEAL ECHOCARDIOGRAM (TEE) (N/A)   Stable day NSR w/ stable BP Breathing comfortably on room air Diuresing well  Plan: Continue current plan  Rexene Alberts, MD 08/31/2018 7:06 PM

## 2018-08-31 NOTE — Progress Notes (Signed)
2 Days Post-Op Procedure(s) (LRB): CORONARY ARTERY BYPASS GRAFTING (CABG) x three, using left internal mammary artery and right leg greater saphenous vein harvested endoscopically (N/A) TRANSESOPHAGEAL ECHOCARDIOGRAM (TEE) (N/A) Subjective: No complaints except being thirsty. Ambulated a little this am. Sitting up in chair.  Objective: Vital signs in last 24 hours: Temp:  [97.5 F (36.4 C)-98.8 F (37.1 C)] 98.8 F (37.1 C) (11/06 0747) Pulse Rate:  [66-111] 99 (11/06 0700) Cardiac Rhythm: Normal sinus rhythm (11/05 1940) Resp:  [9-23] 12 (11/06 0700) BP: (127-175)/(52-144) 160/63 (11/06 0700) SpO2:  [97 %-100 %] 100 % (11/06 0700) Arterial Line BP: (124-167)/(57-65) 167/62 (11/05 0915) Weight:  [74.8 kg] 74.8 kg (11/06 0500)  Hemodynamic parameters for last 24 hours: PAP: (31-45)/(9-34) 40/34 CO:  [4.8 L/min] 4.8 L/min CI:  [2.9 L/min/m2] 2.9 L/min/m2  Intake/Output from previous day: 11/05 0701 - 11/06 0700 In: 953.9 [P.O.:120; I.V.:633.9; IV Piggyback:200] Out: 1135 [Urine:935; Emesis/NG output:200] Intake/Output this shift: No intake/output data recorded.  General appearance: alert and cooperative Neurologic: intact Heart: regular rate and rhythm, S1, S2 normal, no murmur, click, rub or gallop Lungs: clear to auscultation bilaterally Extremities: edema moderate Wound: dressings dry  Lab Results: Recent Labs    08/30/18 1513 08/30/18 1523 08/31/18 0506  WBC 14.0*  --  11.4*  HGB 8.4* 8.8* 8.3*  HCT 29.1* 26.0* 27.9*  PLT 136*  --  127*   BMET:  Recent Labs    08/30/18 0450  08/30/18 1523 08/31/18 0506  NA 143  --  143 145  K 4.6  --  5.0 4.7  CL 112*  --  110 115*  CO2 24  --   --  26  GLUCOSE 144*  --  128* 165*  BUN 20  --  24* 24*  CREATININE 1.66*   < > 2.00* 1.94*  CALCIUM 8.0*  --   --  8.6*   < > = values in this interval not displayed.    PT/INR:  Recent Labs    08/29/18 1340  LABPROT 16.8*  INR 1.38   ABG    Component Value  Date/Time   PHART 7.342 (L) 08/30/2018 0133   HCO3 23.3 08/30/2018 0133   TCO2 25 08/30/2018 1523   ACIDBASEDEF 2.0 08/30/2018 0133   O2SAT 75.1 08/31/2018 0400   CBG (last 3)  Recent Labs    08/30/18 2050 08/31/18 0408 08/31/18 0515  GLUCAP 85 68* 156*   CXR: atelectasis left base  Assessment/Plan: S/P Procedure(s) (LRB): CORONARY ARTERY BYPASS GRAFTING (CABG) x three, using left internal mammary artery and right leg greater saphenous vein harvested endoscopically (N/A) TRANSESOPHAGEAL ECHOCARDIOGRAM (TEE) (N/A)  POD 2 Hemodynamically stable in sinus rhythm. Co-ox is 75.1. Will wean off dopamine and start low dose Coreg. No ACE or ARB with her chronic kidney disease.   Volume excess: start diuresis.  Stage 3 CKD: creat stable at 1.94. Follow. May rise further after diuresis.  Poorly control DM with Hgb A1c of 9.3 preop. Continue levemir and SSI.  IS, ambulation.  Will use ASA 81 mg and Plavix 75 mg with multiple stents.   LOS: 9 days    Gaye Pollack 08/31/2018

## 2018-08-31 NOTE — Care Management Note (Addendum)
Case Management Note  Patient Details  Name: Joy Boyd MRN: [id number] Date of Birth: 1951-04-27  Subjective/Objective:   67yo female presented with atypical chest pain; s/p CABG x 3.                 Action/Plan: CM attempted to meet with patient to discuss transitional needs. Patient asleep at this time, with visitor at her bedside. Son has signed waiver for educational purposes, but isn't available to discuss POC. Patient was visiting her son and daughter-in-law from [place], and has no local PCP or insurance payor listed. CM will continue to follow for dispositional needs.   Expected Discharge Date:                  Expected Discharge Plan:  Home/Self Care  In-House Referral:  NA  Discharge planning Services  CM Consult  Post Acute Care Choice:  NA Choice offered to:  NA  DME Arranged:  N/A DME Agency:  NA  HH Arranged:  NA HH Agency:  NA  Status of Service:  In process, will continue to follow  If discussed at Long Length of Stay Meetings, dates discussed:    Additional Comments: 09/01/18 @ 1238-[name] RNCM-CM met with patient's son [name], who arrived from [place] earlier this week to support his family during patient's hospitalization. CM provided contact information to be given to patient's son [name], whom she was visiting PTA to discuss patient's POC. CM team will continue following. [name] RN, BSN, NCM-BC, ACM-RN [phone] 08/31/2018, 1:13 PM

## 2018-08-31 NOTE — Progress Notes (Addendum)
BS 58. Initiated hypoglycemia protocol. Will recheck in 15 minutes.   Recheck 156

## 2018-09-01 LAB — GLUCOSE, CAPILLARY
GLUCOSE-CAPILLARY: 101 mg/dL — AB (ref 70–99)
Glucose-Capillary: 201 mg/dL — ABNORMAL HIGH (ref 70–99)
Glucose-Capillary: 168 mg/dL — ABNORMAL HIGH (ref 70–99)
Glucose-Capillary: 151 mg/dL — ABNORMAL HIGH (ref 70–99)
Glucose-Capillary: 83 mg/dL (ref 70–99)

## 2018-09-01 LAB — CBC
HEMATOCRIT: 24.4 % — AB (ref 36.0–46.0)
Hemoglobin: 7 g/dL — ABNORMAL LOW (ref 12.0–15.0)
MCH: 26.1 pg (ref 26.0–34.0)
MCHC: 28.7 g/dL — AB (ref 30.0–36.0)
MCV: 91 fL (ref 80.0–100.0)
NRBC: 0 % (ref 0.0–0.2)
PLATELETS: 121 10*3/uL — AB (ref 150–400)
RBC: 2.68 MIL/uL — ABNORMAL LOW (ref 3.87–5.11)
RDW: 15 % (ref 11.5–15.5)
WBC: 10.1 10*3/uL (ref 4.0–10.5)

## 2018-09-01 LAB — TYPE AND SCREEN
ABO/RH(D): O POS
Antibody Screen: NEGATIVE
UNIT DIVISION: 0
UNIT DIVISION: 0
UNIT DIVISION: 0
Unit division: 0

## 2018-09-01 LAB — BASIC METABOLIC PANEL
Anion gap: 8 (ref 5–15)
BUN: 35 mg/dL — ABNORMAL HIGH (ref 8–23)
CALCIUM: 8.2 mg/dL — AB (ref 8.9–10.3)
CO2: 23 mmol/L (ref 22–32)
Chloride: 110 mmol/L (ref 98–111)
Creatinine, Ser: 2.39 mg/dL — ABNORMAL HIGH (ref 0.44–1.00)
GFR, EST AFRICAN AMERICAN: 23 mL/min — AB (ref >60)
GFR, EST NON AFRICAN AMERICAN: 20 mL/min — AB (ref >60)
Glucose, Bld: 212 mg/dL — ABNORMAL HIGH (ref 70–99)
Potassium: 4.5 mmol/L (ref 3.5–5.1)
Sodium: 141 mmol/L (ref 135–145)

## 2018-09-01 LAB — BPAM RBC
BLOOD PRODUCT EXPIRATION DATE: 201912032359
Blood Product Expiration Date: 201912032359
Blood Product Expiration Date: 201912032359
Blood Product Expiration Date: 201912032359
ISSUE DATE / TIME: 201911040944
ISSUE DATE / TIME: 201911040944
UNIT TYPE AND RH: 5100
UNIT TYPE AND RH: 5100
Unit Type and Rh: 5100
Unit Type and Rh: 5100

## 2018-09-01 LAB — PREPARE RBC (CROSSMATCH)

## 2018-09-01 MED ORDER — HYDRALAZINE HCL 20 MG/ML IJ SOLN
10.0000 mg | INTRAMUSCULAR | Status: DC | PRN
  Administered 2018-09-01 – 2018-09-06 (×6): 10 mg via INTRAVENOUS
  Filled 2018-09-01 (×6): qty 1

## 2018-09-01 MED ORDER — CARVEDILOL 6.25 MG PO TABS
6.2500 mg | ORAL_TABLET | Freq: Two times a day (BID) | ORAL | Status: DC
  Administered 2018-09-01 – 2018-09-02 (×2): 6.25 mg via ORAL
  Filled 2018-09-01 (×2): qty 1

## 2018-09-01 MED ORDER — TRAMADOL HCL 50 MG PO TABS: 50.0000 mg | ORAL_TABLET | Freq: Two times a day (BID) | ORAL | Status: DC | PRN

## 2018-09-01 MED ORDER — SODIUM CHLORIDE 0.9% IV SOLUTION
Freq: Once | INTRAVENOUS | Status: AC
  Administered 2018-09-01: 11:00:00 via INTRAVENOUS

## 2018-09-01 MED ORDER — FE FUMARATE-B12-VIT C-FA-IFC PO CAPS
1.0000 | ORAL_CAPSULE | Freq: Two times a day (BID) | ORAL | Status: DC
  Administered 2018-09-01 – 2018-09-13 (×26): 1 via ORAL
  Filled 2018-09-01 (×30): qty 1

## 2018-09-01 MED FILL — Potassium Chloride Inj 2 mEq/ML: INTRAVENOUS | Qty: 40 | Status: CN

## 2018-09-01 MED FILL — Heparin Sodium (Porcine) Inj 1000 Unit/ML: INTRAMUSCULAR | Qty: 30 | Status: CN

## 2018-09-01 MED FILL — Magnesium Sulfate Inj 50%: INTRAMUSCULAR | Qty: 10 | Status: CN

## 2018-09-01 NOTE — Progress Notes (Addendum)
CTS PM Rounds  Walked x3 but weak on IS Stable BP, urine satisfactory with rising creatinine , lasix 80 bid 93% on room air A-pacing

## 2018-09-01 NOTE — Progress Notes (Signed)
3 Days Post-Op Procedure(s) (LRB): CORONARY ARTERY BYPASS GRAFTING (CABG) x three, using left internal mammary artery and right leg greater saphenous vein harvested endoscopically (N/A) TRANSESOPHAGEAL ECHOCARDIOGRAM (TEE) (N/A) Subjective: Chest soreness Some dizziness at times Ambulated Doing 600 on IS  Objective: Vital signs in last 24 hours: Temp:  [97.7 F (36.5 C)-98.8 F (37.1 C)] 98.1 F (36.7 C) (11/07 0400) Pulse Rate:  [42-99] 42 (11/07 0600) Cardiac Rhythm: Normal sinus rhythm (11/07 0400) Resp:  [11-18] 12 (11/07 0600) BP: (96-175)/(41-73) 130/60 (11/07 0600) SpO2:  [92 %-100 %] 94 % (11/07 0600) Weight:  [74.1 kg] 74.1 kg (11/07 0500)  Hemodynamic parameters for last 24 hours:    Intake/Output from previous day: 11/06 0701 - 11/07 0700 In: 1093.4 [P.O.:1020; I.V.:73.4] Out: 2400 [Urine:2400] Intake/Output this shift: Total I/O In: 600 [P.O.:600] Out: 500 [Urine:500]  General appearance: alert and fatigued Neurologic: intact Heart: regular rate and rhythm, S1, S2 normal, no murmur, click, rub or gallop Lungs: diminished breath sounds bibasilar Abdomen: soft, non-tender; bowel sounds normal; no masses,  no organomegaly Extremities: edema moderate Wound: incisions ok  Lab Results: Recent Labs    08/31/18 0506 09/01/18 0434  WBC 11.4* 10.1  HGB 8.3* 7.0*  HCT 27.9* 24.4*  PLT 127* 121*   BMET:  Recent Labs    08/31/18 0506 09/01/18 0434  NA 145 141  K 4.7 4.5  CL 115* 110  CO2 26 23  GLUCOSE 165* 212*  BUN 24* 35*  CREATININE 1.94* 2.39*  CALCIUM 8.6* 8.2*    PT/INR:  Recent Labs    08/29/18 1340  LABPROT 16.8*  INR 1.38   ABG    Component Value Date/Time   PHART 7.342 (L) 08/30/2018 0133   HCO3 23.3 08/30/2018 0133   TCO2 25 08/30/2018 1523   ACIDBASEDEF 2.0 08/30/2018 0133   O2SAT 75.1 08/31/2018 0400   CBG (last 3)  Recent Labs    08/31/18 0741 08/31/18 1217 08/31/18 1521  GLUCAP 98 98 85    Assessment/Plan: S/P  Procedure(s) (LRB): CORONARY ARTERY BYPASS GRAFTING (CABG) x three, using left internal mammary artery and right leg greater saphenous vein harvested endoscopically (N/A) TRANSESOPHAGEAL ECHOCARDIOGRAM (TEE) (N/A)  POD 3 Hemodynamically stable in sinus rhythm with some PVC's and bigeminy as preop. Continue low dose Coreg. No ACE or ARB with stage 3B CKD.  Stage 3B CKD: creat rise to 2.39 with diuresis yesterday. Will hold off on diuretic today although weight still 9 lbs over preop. Baseline creat probably 1.5-1.6. Probably related to long hx of poorly controlled DM.  Expected acute postop blood loss anemia: she was anemic preop and got 1 unit PRBC's in the OR. Hgb has trended down so will give another unit. Discussed with son and pt.  Continue IS, ambulation.  DM: under adequate control     LOS: 10 days    Gaye Pollack 09/01/2018

## 2018-09-02 LAB — TYPE AND SCREEN
ABO/RH(D): O POS
ANTIBODY SCREEN: NEGATIVE
Unit division: 0

## 2018-09-02 LAB — BPAM RBC
Blood Product Expiration Date: 201912012359
ISSUE DATE / TIME: 201911071011
UNIT TYPE AND RH: 5100

## 2018-09-02 LAB — GLUCOSE, CAPILLARY
GLUCOSE-CAPILLARY: 172 mg/dL — AB (ref 70–99)
Glucose-Capillary: 74 mg/dL (ref 70–99)
Glucose-Capillary: 68 mg/dL — ABNORMAL LOW (ref 70–99)
Glucose-Capillary: 125 mg/dL — ABNORMAL HIGH (ref 70–99)
Glucose-Capillary: 225 mg/dL — ABNORMAL HIGH (ref 70–99)

## 2018-09-02 LAB — BASIC METABOLIC PANEL
Anion gap: 8 (ref 5–15)
BUN: 40 mg/dL — ABNORMAL HIGH (ref 8–23)
CALCIUM: 8.7 mg/dL — AB (ref 8.9–10.3)
CO2: 24 mmol/L (ref 22–32)
CREATININE: 2.15 mg/dL — AB (ref 0.44–1.00)
Chloride: 109 mmol/L (ref 98–111)
GFR calc non Af Amer: 23 mL/min — ABNORMAL LOW (ref >60)
GFR, EST AFRICAN AMERICAN: 27 mL/min — AB (ref >60)
Glucose, Bld: 69 mg/dL — ABNORMAL LOW (ref 70–99)
Potassium: 4 mmol/L (ref 3.5–5.1)
SODIUM: 141 mmol/L (ref 135–145)

## 2018-09-02 LAB — CBC
HCT: 31.5 % — ABNORMAL LOW (ref 36.0–46.0)
Hemoglobin: 9.8 g/dL — ABNORMAL LOW (ref 12.0–15.0)
MCH: 27.8 pg (ref 26.0–34.0)
MCHC: 31.1 g/dL (ref 30.0–36.0)
MCV: 89.2 fL (ref 80.0–100.0)
NRBC: 0 % (ref 0.0–0.2)
PLATELETS: 167 10*3/uL (ref 150–400)
RBC: 3.53 MIL/uL — ABNORMAL LOW (ref 3.87–5.11)
RDW: 14.8 % (ref 11.5–15.5)
WBC: 10.1 10*3/uL (ref 4.0–10.5)

## 2018-09-02 MED ORDER — SODIUM CHLORIDE 0.9% FLUSH: 3.0000 mL | INTRAVENOUS | Status: DC | PRN

## 2018-09-02 MED ORDER — SODIUM CHLORIDE 0.9 % IV SOLN: 250.0000 mL | INTRAVENOUS | Status: DC | PRN

## 2018-09-02 MED ORDER — ONDANSETRON HCL 4 MG PO TABS: 4.0000 mg | ORAL_TABLET | Freq: Four times a day (QID) | ORAL | Status: DC | PRN

## 2018-09-02 MED ORDER — INSULIN ASPART 100 UNIT/ML ~~LOC~~ SOLN: 0.0000 [IU] | Freq: Three times a day (TID) | SUBCUTANEOUS | Status: DC

## 2018-09-02 MED ORDER — ONDANSETRON HCL 4 MG/2ML IJ SOLN
4.0000 mg | Freq: Four times a day (QID) | INTRAMUSCULAR | Status: DC | PRN
  Administered 2018-09-02 – 2018-09-16 (×4): 4 mg via INTRAVENOUS
  Filled 2018-09-02 (×4): qty 2

## 2018-09-02 MED ORDER — BISACODYL 5 MG PO TBEC: 10.0000 mg | DELAYED_RELEASE_TABLET | Freq: Every day | ORAL | Status: DC | PRN

## 2018-09-02 MED ORDER — ACETAMINOPHEN 325 MG PO TABS
650.0000 mg | ORAL_TABLET | Freq: Four times a day (QID) | ORAL | Status: DC | PRN
  Administered 2018-09-02 – 2018-09-03 (×2): 650 mg via ORAL
  Filled 2018-09-02 (×3): qty 2

## 2018-09-02 MED ORDER — AMLODIPINE BESYLATE 5 MG PO TABS
5.0000 mg | ORAL_TABLET | Freq: Every day | ORAL | Status: DC
  Administered 2018-09-02: 5 mg via ORAL
  Filled 2018-09-02 (×2): qty 1

## 2018-09-02 MED ORDER — CARVEDILOL 12.5 MG PO TABS
12.5000 mg | ORAL_TABLET | Freq: Two times a day (BID) | ORAL | Status: DC
  Administered 2018-09-02 – 2018-09-03 (×2): 12.5 mg via ORAL
  Filled 2018-09-02 (×2): qty 1

## 2018-09-02 MED ORDER — INSULIN ASPART 100 UNIT/ML ~~LOC~~ SOLN
0.0000 [IU] | Freq: Three times a day (TID) | SUBCUTANEOUS | Status: DC
  Administered 2018-09-02: 2 [IU] via SUBCUTANEOUS
  Administered 2018-09-02: 4 [IU] via SUBCUTANEOUS
  Administered 2018-09-02 – 2018-09-03 (×2): 8 [IU] via SUBCUTANEOUS
  Administered 2018-09-03: 4 [IU] via SUBCUTANEOUS
  Administered 2018-09-03 – 2018-09-04 (×4): 2 [IU] via SUBCUTANEOUS
  Administered 2018-09-04: 8 [IU] via SUBCUTANEOUS
  Administered 2018-09-05 (×3): 4 [IU] via SUBCUTANEOUS
  Administered 2018-09-06: 3 [IU] via SUBCUTANEOUS
  Administered 2018-09-06 (×2): 4 [IU] via SUBCUTANEOUS
  Administered 2018-09-07 (×2): 2 [IU] via SUBCUTANEOUS
  Administered 2018-09-07 (×2): 4 [IU] via SUBCUTANEOUS
  Administered 2018-09-08 (×3): 2 [IU] via SUBCUTANEOUS
  Administered 2018-09-08: 8 [IU] via SUBCUTANEOUS
  Administered 2018-09-09 (×2): 4 [IU] via SUBCUTANEOUS
  Administered 2018-09-09 – 2018-09-10 (×2): 2 [IU] via SUBCUTANEOUS
  Administered 2018-09-10: 8 [IU] via SUBCUTANEOUS
  Administered 2018-09-10 – 2018-09-11 (×3): 2 [IU] via SUBCUTANEOUS
  Administered 2018-09-11: 4 [IU] via SUBCUTANEOUS
  Administered 2018-09-11 – 2018-09-12 (×2): 8 [IU] via SUBCUTANEOUS
  Administered 2018-09-12: 7 [IU] via SUBCUTANEOUS
  Administered 2018-09-12 – 2018-09-13 (×4): 2 [IU] via SUBCUTANEOUS
  Administered 2018-09-14: 8 [IU] via SUBCUTANEOUS
  Administered 2018-09-14 (×2): 12 [IU] via SUBCUTANEOUS
  Administered 2018-09-15 (×2): 2 [IU] via SUBCUTANEOUS
  Administered 2018-09-15: 8 [IU] via SUBCUTANEOUS
  Administered 2018-09-16 – 2018-09-18 (×6): 2 [IU] via SUBCUTANEOUS
  Administered 2018-09-18: 4 [IU] via SUBCUTANEOUS
  Administered 2018-09-18 – 2018-09-19 (×2): 2 [IU] via SUBCUTANEOUS
  Administered 2018-09-19 – 2018-09-20 (×4): 4 [IU] via SUBCUTANEOUS
  Administered 2018-09-21: 12 [IU] via SUBCUTANEOUS
  Administered 2018-09-22: 4 [IU] via SUBCUTANEOUS
  Administered 2018-09-22: 2 [IU] via SUBCUTANEOUS

## 2018-09-02 MED ORDER — BISACODYL 10 MG RE SUPP: 10.0000 mg | Freq: Every day | RECTAL | Status: DC | PRN

## 2018-09-02 MED ORDER — POTASSIUM CHLORIDE CRYS ER 20 MEQ PO TBCR
20.0000 meq | EXTENDED_RELEASE_TABLET | Freq: Every day | ORAL | Status: DC
  Administered 2018-09-02: 20 meq via ORAL
  Filled 2018-09-02: qty 1

## 2018-09-02 MED ORDER — OXYCODONE HCL 5 MG PO TABS: 5.0000 mg | ORAL_TABLET | ORAL | Status: DC | PRN

## 2018-09-02 MED ORDER — MOVING RIGHT ALONG BOOK
Freq: Once | Status: AC
  Administered 2018-09-02: 16:00:00
  Filled 2018-09-02: qty 1

## 2018-09-02 MED ORDER — PANTOPRAZOLE SODIUM 40 MG PO TBEC
40.0000 mg | DELAYED_RELEASE_TABLET | Freq: Every day | ORAL | Status: DC
  Administered 2018-09-03 – 2018-09-22 (×20): 40 mg via ORAL
  Filled 2018-09-02 (×21): qty 1

## 2018-09-02 MED ORDER — SODIUM CHLORIDE 0.9% FLUSH
3.0000 mL | Freq: Two times a day (BID) | INTRAVENOUS | Status: DC
  Administered 2018-09-02 – 2018-09-21 (×36): 3 mL via INTRAVENOUS

## 2018-09-02 MED ORDER — TRAMADOL HCL 50 MG PO TABS
50.0000 mg | ORAL_TABLET | Freq: Two times a day (BID) | ORAL | Status: DC | PRN
  Administered 2018-09-03 – 2018-09-11 (×10): 50 mg via ORAL
  Filled 2018-09-02 (×12): qty 1

## 2018-09-02 MED ORDER — DOCUSATE SODIUM 100 MG PO CAPS: 200.0000 mg | ORAL_CAPSULE | Freq: Every day | ORAL | Status: DC

## 2018-09-02 MED ORDER — METOLAZONE 5 MG PO TABS
5.0000 mg | ORAL_TABLET | Freq: Once | ORAL | Status: AC
  Administered 2018-09-02: 5 mg via ORAL
  Filled 2018-09-02: qty 1

## 2018-09-02 MED ORDER — FUROSEMIDE 10 MG/ML IJ SOLN
80.0000 mg | Freq: Once | INTRAMUSCULAR | Status: AC
  Administered 2018-09-02: 80 mg via INTRAVENOUS
  Filled 2018-09-02: qty 8

## 2018-09-02 MED ORDER — FUROSEMIDE 40 MG PO TABS
40.0000 mg | ORAL_TABLET | Freq: Every day | ORAL | Status: DC
  Administered 2018-09-03: 40 mg via ORAL
  Filled 2018-09-02: qty 1

## 2018-09-02 MED FILL — Heparin Sodium (Porcine) Inj 1000 Unit/ML: INTRAMUSCULAR | Qty: 30 | Status: AC

## 2018-09-02 MED FILL — Potassium Chloride Inj 2 mEq/ML: INTRAVENOUS | Qty: 40 | Status: AC

## 2018-09-02 MED FILL — Magnesium Sulfate Inj 50%: INTRAMUSCULAR | Qty: 10 | Status: AC

## 2018-09-02 NOTE — Discharge Summary (Signed)
Physician Discharge Summary  Patient ID: Joy Boyd MRN: [id number] DOB/AGE: 65/29/1952 67 y.o.  Admit date: 08/20/2018 Discharge date: 09/22/2018  Admission Diagnoses: Patient Active Problem List   Diagnosis Date Noted  . Constipation by delayed colonic transit 09/10/2018  . CAD (coronary artery disease) 08/29/2018  . Acute on chronic combined systolic and diastolic CHF (congestive heart failure) ([facility]) 08/23/2018  . PVC (premature ventricular contraction) 08/23/2018  . Diabetes mellitus type 2 in obese ([facility]) 08/23/2018  . Pure hypercholesterolemia 08/23/2018  . CAD S/P percutaneous coronary angioplasty   . Dilated cardiomyopathy ([facility])   . Coronary artery disease involving native coronary artery of native heart with angina pectoris ([facility])   . Unstable angina ([facility])   . Abnormal nuclear stress test   . Atypical chest pain 08/21/2018    Discharge Diagnoses:  Principal Problem:   Acute on chronic combined systolic and diastolic CHF (congestive heart failure) (H[facility]) Active Problems:   Coronary artery disease involving native coronary artery of native heart with angina pectoris (HCC)   Unstable angina (H[facility])   Abnormal nuclear stress test   PVC (premature ventricular contraction)   Diabetes mellitus type 2 in obese ([facility])   Pure hypercholesterolemia   CAD S/P percutaneous coronary angioplasty   Dilated cardiomyopathy (HCC)   CAD (coronary artery disease)   Constipation by delayed colonic transit   Discharged Condition: good  HPI:   The patient is a 67 year old [name] with a history of poorly controlled diabetes, chronic kidney disease with a creatinine of 1.5-1.7, hypertension, hyperlipidemia, acute on chronic systolic and diastolic congestive heart failure and multivessel coronary disease who underwent multiple stenting procedures in [place].  She reportedly had stenting of the LAD and RCA in 2013.  She apparently had further stents placed in the left circumflex and LAD  in April 2019.  She has apparently had 7 stents total.  According to her son who lives in the [place] her left ventricular ejection fraction at that time was 45%.  She was admitted on 08/20/2018 with chest tightness.  She has had increasing shortness of breath and cough for about 1 month.  Troponin levels were all negative on admission.  Letter cardiogram showed sinus rhythm with frequent PVCs.  There is left ventricular hypertrophy with nonspecific ST and T wave changes.  BNP was elevated at 532.8.  2D echocardiogram on 08/21/2018 showed a left ventricular ejection fraction of 15 to 20% with diffuse hypokinesis.  There is grade 2 diastolic dysfunction.  There is mild right ventricular hypokinesis.  There is mild MR.  There is no aortic valve disease.  Left heart catheterization was performed on 08/23/2018.  This showed an 85% distal left main stenosis extending into the ostium of the LAD.  There is about 75% ostial left circumflex stenosis.  There are patent stents in the left circumflex, LAD, and right coronary arteries.  The RCA has about 60% stenosis within the most distal stent.  LVEDP was 20 mmHg.  Right heart catheterization was not performed for some reason.  The patient has remained free of chest pain and shortness of breath since admission  The patient lives in [place] with her husband but travels frequently to the [place] to visit her son.  She has been here in the past month or so and was planning on returning to [place] about a month.  She does not speak any English but her son who lives here speaks fluent [place] and acts as a Optometrist.  He is with her  today.   Hospital Course:   Joy Boyd underwent a coronary bypass grafting x3 on 08/29/2018.  She tolerated the procedure well and was transferred to the surgical ICU.  She was extubated in a timely manner.  Postop day 1 she continued to make steady progress.  She remained in normal sinus rhythm with PVCs.  We continue to wean her  off of milrinone and dopamine.  She was making good urine.  Would we discontinue her chest tube, arterial line, and Swan-Ganz catheter.  We treated her nausea with Reglan.  Her renal function remained stable at this time.  Postop day 3 we continue to wean off dopamine and started low-dose Coreg.  We held an ACE inhibitor due to chronic kidney disease.  We initiated a diuretic regimen for fluid overload.  We added aspirin 81 mg and Plavix 75 mg due to multiple stents.  She did have some expected acute postoperative blood loss anemia which she was treated with 1 unit packed red blood cells in the OR.  Her hemoglobin continued to trend down, therefore we added another unit.  Postop day 4 he is feeling much better and ambulating around the ICU.  She has been a bit hypertensive but in normal sinus rhythm for the past 24 hours.  We increased Coreg and added as needed hydralazine.  We continued her Lasix and added metolazone for fluid overload.  We discontinued her Foley catheter and she was ready to transfer to 4 E. for continued care. We continued to work on mobility and muscular strength. She did have some hyperkalemia and this was trending down. She also had some AKI which improved.  We consulted nephrology to assist with renal optimization.  She was also having some intermittent abdominal pain with gas and constipation.  This did not resolve quickly, therefore we consulted both medicine and GI for assistance.  We did obtain a KUB which was normal.  It was suspected that this abdominal discomfort was due to gastroparesis therefore we initiated Reglan therapy as well as a daily laxative for her constipation.  Her abdominal distention and discomfort and eventually subsided.  We did initiate Demadex diuretic therapy for 2 days for fluid overload.  She is to follow-up with nephrology outpatient for long-term therapy.  Today, she is tolerating room air, ambulating with limited assistance, her incisions are healing well and  she is ready for discharge home. Chest tube sutures should be fine to remove on discharge since the patient has been inpatient for a few weeks.   Consults: None  Significant Diagnostic Studies  CLINICAL DATA:  Patient status post CABG 08/29/2018.  EXAM: PORTABLE CHEST 1 VIEW  COMPARISON:  Single-view of the chest 08/29/2018 and 08/30/2018.  FINDINGS: Chest tubes and right IJ approach Swan-Ganz catheter have been removed. Right IJ sheath remains in place. There is no pneumothorax. Small left pleural effusion and basilar atelectasis have increased since yesterday's examination. The right lung is clear. Heart size is enlarged. Seven intact median sternotomy wires are unchanged.  IMPRESSION: Status post Swan-Ganz catheter and chest tube removal. Negative for pneumothorax.  Some increase in a small left pleural effusion and basilar atelectasis.  Cardiomegaly without edema.   Electronically Signed   By: Inge Rise M.D.   On: 08/31/2018 09:28  Treatments:  CARDIOVASCULAR SURGERY OPERATIVE NOTE  08/29/2018  Surgeon:  Gaye Pollack, MD  First Assistant: Nicholes Rough,  PA-C   Preoperative Diagnosis:  Severe multi-vessel coronary artery disease with severe ischemic cardiomyopathy  Postoperative Diagnosis:  Same   Procedure:  1. Median Sternotomy 2. Extracorporeal circulation 3.   Coronary artery bypass grafting x 3   Left internal mammary graft to the LAD  SVG to OM  SVG to PDA  4.   Endoscopic vein harvest from the right leg 5.   Insertion of left femoral arterial line   Anesthesia:  General Endotracheal    Discharge Exam: Blood pressure (!) 155/47, pulse 76, temperature 98.6 F (37 C), temperature source Oral, resp. rate 14, height 5\' 1"  (1.549 m), weight 74 kg, SpO2 92 %.    General appearance: alert, cooperative and no distress Heart: regular rate and rhythm Lungs: mildly dim in bases Abdomen: benign Extremities:  minor edema Wound: incis healing well  Disposition: Discharge disposition: 01-Home or Self Care       Discharge Instructions    Amb Referral to Cardiac Rehabilitation   Complete by:  As directed    Diagnosis:  CABG   CABG X ___:  3   Discharge patient   Complete by:  As directed    Discharge disposition:  01-Home or Self Care   Discharge patient date:  09/22/2018     Allergies as of 09/22/2018   No Known Allergies     Medication List    STOP taking these medications   aspirin 325 MG tablet Replaced by:  aspirin 81 MG EC tablet   bisoprolol 5 MG tablet Commonly known as:  ZEBETA   insulin NPH-regular Human (70-30) 100 UNIT/ML injection   nitroGLYCERIN 0.4 MG SL tablet Commonly known as:  NITROSTAT   valsartan-hydrochlorothiazide 160-12.5 MG tablet Commonly known as:  DIOVAN-HCT     TAKE these medications   acetaminophen 325 MG tablet Commonly known as:  TYLENOL Take 2 tablets (650 mg total) by mouth every 6 (six) hours as needed for mild pain.   amLODipine 10 MG tablet Commonly known as:  NORVASC Take 1 tablet (10 mg total) by mouth daily.   aspirin 81 MG EC tablet Take 1 tablet (81 mg total) by mouth daily. Replaces:  aspirin 325 MG tablet   atorvastatin 80 MG tablet Commonly known as:  LIPITOR Take 1 tablet (80 mg total) by mouth daily. What changed:    medication strength  how much to take  when to take this   bisacodyl 10 MG suppository Commonly known as:  DULCOLAX Place 1 suppository (10 mg total) rectally daily as needed for moderate constipation.   carvedilol 25 MG tablet Commonly known as:  COREG Take 1 tablet (25 mg total) by mouth 2 (two) times daily with a meal.   clopidogrel 75 MG tablet Commonly known as:  PLAVIX Take 75 mg by mouth daily.   docusate sodium 100 MG capsule Commonly known as:  COLACE Take 1 capsule (100 mg total) by mouth 2 (two) times daily.   hydrALAZINE 50 MG tablet Commonly known as:  APRESOLINE Take  1 tablet (50 mg total) by mouth every 8 (eight) hours.   insulin aspart protamine- aspart (70-30) 100 UNIT/ML injection Commonly known as:  NOVOLOG MIX 70/30 Inject 0.15 mLs (15 Units total) into the skin 2 (two) times daily with a meal. If blood sugars over 200 , change to 20 units each time insulin given   senna 8.6 MG Tabs tablet Commonly known as:  SENOKOT Take 1 tablet (8.6 mg total) by mouth 2 (two) times daily.   simethicone 80 MG chewable tablet Commonly known as:  MYLICON Chew 2 tablets (160  mg total) by mouth 3 (three) times daily before meals.   traMADol 50 MG tablet Commonly known as:  ULTRAM Take 1 tablet (50 mg total) by mouth every 12 (twelve) hours as needed for moderate pain.            Durable Medical Equipment  (From admission, onward)         Start     Ordered   09/09/18 1648  For home use only DME Walker rolling  Once    Question:  Patient needs a walker to treat with the following condition  Answer:  Weakness   09/09/18 1647   09/09/18 1025  For home use only DME 4 wheeled rolling walker with seat  Once    Comments:  Post op CABG  Question:  Patient needs a walker to treat with the following condition  Answer:  Weakness generalized   09/09/18 1024         Follow-up Information    Gaye Pollack, MD Follow up.   Specialty:  Cardiothoracic Surgery Why:  Your routine follow-up appointment is on 10/17/2018 at 1:30 PM.  Please arrive at 1:00 PM for a chest x-ray located at [facility] imaging which is on the first floor of our building. Contact information: [place] [address] [phone]        Daune Perch, NP Follow up.   Specialty:  Nurse Practitioner Why:  Pecolia Ades, NP 12/11 @11am  ([place])  Contact information: [address] [place] [address] [phone]        Nahser, Wonda Cheng, MD. Call.   Specialty:  Cardiology Contact information: [place]  [address] [place] [address] [phone]          The patient has been discharged on:   1.Beta Blocker:  Yes [ yes  ]                              No   [   ]                              If No, reason:  2.Ace Inhibitor/ARB: Yes [   ]                                     No  [ no   ]                                     If No, reason: CKD  3.Statin:   Yes [ yes  ]                  No  [   ]                  If No, reason:  4.[place]:  Yes  [place]   ]                  No   [   ]                  If No, reason:    Signed: Wilder Glade Gold 09/22/2018, 8:42 AM

## 2018-09-02 NOTE — Progress Notes (Signed)
4 Days Post-Op Procedure(s) (LRB): CORONARY ARTERY BYPASS GRAFTING (CABG) x three, using left internal mammary artery and right leg greater saphenous vein harvested endoscopically (N/A) TRANSESOPHAGEAL ECHOCARDIOGRAM (TEE) (N/A) Subjective: Only complaint is of puffiness Feeling better, ambulated around ICU this am.  Objective: Vital signs in last 24 hours: Temp:  [96.9 F (36.1 C)-98.8 F (37.1 C)] 98.6 F (37 C) (11/08 0400) Pulse Rate:  [39-87] 43 (11/08 0700) Cardiac Rhythm: Normal sinus rhythm (11/08 0400) Resp:  [12-23] 16 (11/08 0400) BP: (133-192)/(43-94) 190/68 (11/08 0700) SpO2:  [90 %-97 %] 97 % (11/08 0747)  Hemodynamic parameters for last 24 hours:    Intake/Output from previous day: 11/07 0701 - 11/08 0700 In: 850 [P.O.:540; I.V.:10; Blood:300] Out: 1285 [Urine:1285] Intake/Output this shift: No intake/output data recorded.  General appearance: alert and cooperative Neurologic: intact Heart: regular rate and rhythm, S1, S2 normal, no murmur, click, rub or gallop Lungs: clear to auscultation bilaterally Extremities: edema moderate Wound: incision ok  Lab Results: Recent Labs    09/01/18 0434 09/02/18 0518  WBC 10.1 10.1  HGB 7.0* 9.8*  HCT 24.4* 31.5*  PLT 121* 167   BMET:  Recent Labs    09/01/18 0434 09/02/18 0518  NA 141 141  K 4.5 4.0  CL 110 109  CO2 23 24  GLUCOSE 212* 69*  BUN 35* 40*  CREATININE 2.39* 2.15*  CALCIUM 8.2* 8.7*    PT/INR: No results for input(s): LABPROT, INR in the last 72 hours. ABG    Component Value Date/Time   PHART 7.342 (L) 08/30/2018 0133   HCO3 23.3 08/30/2018 0133   TCO2 25 08/30/2018 1523   ACIDBASEDEF 2.0 08/30/2018 0133   O2SAT 75.1 08/31/2018 0400   CBG (last 3)  Recent Labs    09/01/18 2101 09/01/18 2318 09/02/18 0312  GLUCAP 101* 83 74    Assessment/Plan: S/P Procedure(s) (LRB): CORONARY ARTERY BYPASS GRAFTING (CABG) x three, using left internal mammary artery and right leg greater  saphenous vein harvested endoscopically (N/A) TRANSESOPHAGEAL ECHOCARDIOGRAM (TEE) (N/A)  POD 4 She has been hypertensive in sinus rhythm past 24 hrs. Will increase Coreg to 12.5 bid. Hydralazine prn. No ACE or ARB due to renal function. I suspect that BP will come down with diuresis. Preop EF 25%. Will add po Norvasc or hydralazine if BP stays up after diuresis.  Stage 3 CKD: creat down some today so will give a dose of lasix and metolazone, then start daily oral lasix tomorrow. Replace K+ as needed. Baseline creat 1.5-1.6.  DM: poorly controlled preop. Glucose has been ok postop but not eating anything yet. Continue Levemir and SSI.  Continue IS, ambulation DC sleeve and foley  Transfer to 4E.   LOS: 11 days    Gaye Pollack 09/02/2018

## 2018-09-02 NOTE — Progress Notes (Signed)
Inpatient Diabetes Program Recommendations  AACE/ADA: New Consensus Statement on Inpatient Glycemic Control (2015)  Target Ranges:  Prepandial:   less than 140 mg/dL      Peak postprandial:   less than 180 mg/dL (1-2 hours)      Critically ill patients:  140 - 180 mg/dL   Lab Results  Component Value Date   GLUCAP 125 (H) 09/02/2018   HGBA1C 9.3 (H) 08/22/2018     Results for JANISA, LABUS (MRN [id number]) as of 09/02/2018 14:54  Ref. Range 09/01/2018 21:01 09/01/2018 23:18 09/02/2018 03:12 09/02/2018 08:10 09/02/2018 12:45  Glucose-Capillary Latest Ref Range: 70 - 99 mg/dL 101 (H) 83 74 68 (L) 125 (H)    DM2   Home DM meds: 70/30 insulin 35 units am/ 25 units pm   Inpatient diabetes meds:  Levemir 10 units daily                                          Novolog (0-24 units) tid ac and hs                                            Note am blood sugar was 68 mg/dl.    MD please note the following inpatient diabetes recommendations:  Decrease Levemir to 8 units daily.    Thank you.  -- Will follow during hospitalization.--  Jonna Clark RN, MSN Diabetes Coordinator Inpatient Glycemic Control Team Team Pager: [phone] (8am-5pm)

## 2018-09-02 NOTE — Anesthesia Postprocedure Evaluation (Signed)
Anesthesia Post Note  Patient: Joy Boyd  Procedure(s) Performed: CORONARY ARTERY BYPASS GRAFTING (CABG) x three, using left internal mammary artery and right leg greater saphenous vein harvested endoscopically (N/A Chest) TRANSESOPHAGEAL ECHOCARDIOGRAM (TEE) (N/A )     Patient location during evaluation: SICU Anesthesia Type: General Level of consciousness: sedated Pain management: pain level controlled Vital Signs Assessment: post-procedure vital signs reviewed and stable Respiratory status: patient remains intubated per anesthesia plan Cardiovascular status: stable Postop Assessment: no apparent nausea or vomiting Anesthetic complications: no    Last Vitals:  Vitals:   09/02/18 0700 09/02/18 0747  BP: (!) 190/68   Pulse: (!) 43   Resp:    Temp:    SpO2: 96% 97%    Last Pain:  Vitals:   09/02/18 0400  TempSrc: Oral  PainSc: Asleep                 Trinity Haun S

## 2018-09-03 LAB — CBC
HEMATOCRIT: 35.2 % — AB (ref 36.0–46.0)
HEMOGLOBIN: 11.1 g/dL — AB (ref 12.0–15.0)
MCH: 27.5 pg (ref 26.0–34.0)
MCHC: 31.5 g/dL (ref 30.0–36.0)
MCV: 87.3 fL (ref 80.0–100.0)
Platelets: 216 10*3/uL (ref 150–400)
RBC: 4.03 MIL/uL (ref 3.87–5.11)
RDW: 14.2 % (ref 11.5–15.5)
WBC: 9.8 10*3/uL (ref 4.0–10.5)
nRBC: 0 % (ref 0.0–0.2)

## 2018-09-03 LAB — BASIC METABOLIC PANEL
Anion gap: 9 (ref 5–15)
BUN: 38 mg/dL — ABNORMAL HIGH (ref 8–23)
CHLORIDE: 103 mmol/L (ref 98–111)
CO2: 26 mmol/L (ref 22–32)
CREATININE: 1.94 mg/dL — AB (ref 0.44–1.00)
Calcium: 8.7 mg/dL — ABNORMAL LOW (ref 8.9–10.3)
GFR calc non Af Amer: 26 mL/min — ABNORMAL LOW (ref >60)
GFR, EST AFRICAN AMERICAN: 30 mL/min — AB (ref >60)
GLUCOSE: 168 mg/dL — AB (ref 70–99)
Potassium: 3.6 mmol/L (ref 3.5–5.1)
Sodium: 138 mmol/L (ref 135–145)

## 2018-09-03 LAB — GLUCOSE, CAPILLARY
GLUCOSE-CAPILLARY: 145 mg/dL — AB (ref 70–99)
Glucose-Capillary: 157 mg/dL — ABNORMAL HIGH (ref 70–99)
Glucose-Capillary: 185 mg/dL — ABNORMAL HIGH (ref 70–99)

## 2018-09-03 MED ORDER — POTASSIUM CHLORIDE CRYS ER 20 MEQ PO TBCR
40.0000 meq | EXTENDED_RELEASE_TABLET | Freq: Every day | ORAL | Status: DC
  Administered 2018-09-03 – 2018-09-07 (×5): 40 meq via ORAL
  Filled 2018-09-03: qty 2
  Filled 2018-09-03: qty 4
  Filled 2018-09-03: qty 2
  Filled 2018-09-03: qty 4
  Filled 2018-09-03 (×3): qty 2

## 2018-09-03 MED ORDER — AMLODIPINE BESYLATE 10 MG PO TABS
10.0000 mg | ORAL_TABLET | Freq: Every day | ORAL | Status: DC
  Administered 2018-09-04 – 2018-09-22 (×19): 10 mg via ORAL
  Filled 2018-09-03 (×19): qty 1

## 2018-09-03 MED ORDER — AMLODIPINE BESYLATE 10 MG PO TABS
10.0000 mg | ORAL_TABLET | Freq: Once | ORAL | Status: AC
  Administered 2018-09-03: 10 mg via ORAL
  Filled 2018-09-03: qty 1

## 2018-09-03 MED ORDER — AMLODIPINE BESYLATE 5 MG PO TABS: 5.0000 mg | ORAL_TABLET | Freq: Every day | ORAL | Status: DC

## 2018-09-03 MED ORDER — CARVEDILOL 25 MG PO TABS
25.0000 mg | ORAL_TABLET | Freq: Two times a day (BID) | ORAL | Status: DC
  Administered 2018-09-03 – 2018-09-22 (×36): 25 mg via ORAL
  Filled 2018-09-03 (×37): qty 1

## 2018-09-03 NOTE — Progress Notes (Signed)
Pt briefly had 1-2 minute episode of afib RVR with rate up to 140s shortly after returning to bed from bathroom. Converted back to sinus rhythm on own, HR 80s. BP up this am 179/69, PRN hydralazine given. Will continue to monitor.

## 2018-09-03 NOTE — Progress Notes (Signed)
EPW removed per protocol. Tips intact. VSS. Pt instructed on 1 hour bedrest. Call light in reach. Will continue to monitor.  Clyde Canterbury, RN

## 2018-09-03 NOTE — Progress Notes (Addendum)
      [address]       [place] [address]             [phone]      5 Days Post-Op Procedure(s) (LRB): CORONARY ARTERY BYPASS GRAFTING (CABG) x three, using left internal mammary artery and right leg greater saphenous vein harvested endoscopically (N/A) TRANSESOPHAGEAL ECHOCARDIOGRAM (TEE) (N/A)   Subjective:  Son provides translation.  Overall she is feeling better.  She continues to have expected incisional discomfort.  She has had multiple loose stools.  + ambulation with walker and minimal assist  Objective: Vital signs in last 24 hours: Temp:  [97.6 F (36.4 C)-98.9 F (37.2 C)] 98.1 F (36.7 C) (11/09 0749) Pulse Rate:  [85-94] 94 (11/09 0800) Cardiac Rhythm: Normal sinus rhythm;Bundle branch block (11/09 0700) Resp:  [13-19] 17 (11/09 0749) BP: (139-190)/(57-74) 159/59 (11/09 0749) SpO2:  [93 %-98 %] 95 % (11/09 0911) Weight:  [70.6 kg] 70.6 kg (11/09 0430)  Intake/Output from previous day: 11/08 0701 - 11/09 0700 In: 240 [P.O.:240] Out: 1400 [Urine:1400]  General appearance: alert, cooperative and no distress Heart: regular rate and rhythm Lungs: clear to auscultation bilaterally Abdomen: soft, non-tender; bowel sounds normal; no masses,  no organomegaly Extremities: edema trace Wound: clean and dry  Lab Results: Recent Labs    09/02/18 0518 09/03/18 0653  WBC 10.1 9.8  HGB 9.8* 11.1*  HCT 31.5* 35.2*  PLT 167 216   BMET:  Recent Labs    09/02/18 0518 09/03/18 0653  NA 141 138  K 4.0 3.6  CL 109 103  CO2 24 26  GLUCOSE 69* 168*  BUN 40* 38*  CREATININE 2.15* 1.94*  CALCIUM 8.7* 8.7*    PT/INR: No results for input(s): LABPROT, INR in the last 72 hours. ABG    Component Value Date/Time   PHART 7.342 (L) 08/30/2018 0133   HCO3 23.3 08/30/2018 0133   TCO2 25 08/30/2018 1523   ACIDBASEDEF 2.0 08/30/2018 0133   O2SAT 75.1 08/31/2018 0400   CBG (last 3)  Recent Labs    09/02/18 1600 09/02/18 2123 09/03/18 0648    GLUCAP 172* 225* 157*    Assessment/Plan: S/P Procedure(s) (LRB): CORONARY ARTERY BYPASS GRAFTING (CABG) x three, using left internal mammary artery and right leg greater saphenous vein harvested endoscopically (N/A) TRANSESOPHAGEAL ECHOCARDIOGRAM (TEE) (N/A)  1. CV- very brief run of Atrial Fibrillation while going to bathroom, in NSR.  Remains Hypertensive- will continue Coreg at 12.5 mg BID, add Norvasc 5 mg daily for better BP control, will d/c EPW today 2. Pulm- no acute issues, off oxygen, continue IS 3. Renal- AKI, resolving, creatinine down to 1.94, weight down about 6 lbs after IV diuresis yesterday, start oral Lasix, today, increase potassium supplementation 4. GI- loose stools, will d/c stool softners, stools are also dark, likely due to iron supplementation, HGb is 11 5. DM- sugars mostly controlled, continue current regimen, will adjust as dietary intake improves 6. Dispo- patient stable, very brief run of A. Fib while going to bathroom, otherwise maintaining NSR, will add Norvasc for better BP control, d/c EPW today, continue diuretics,    LOS: 12 days    Ellwood Handler 09/03/2018 Patient seen and examined Will increase coreg to 25 BID  Remo Lipps C. Roxan Hockey, MD [facility] [phone]

## 2018-09-03 NOTE — Progress Notes (Signed)
CARDIAC REHAB PHASE I   PRE:  Rate/Rhythm: 84 SR   BP:  Sitting: 151/69      SaO2: 95% RA  MODE:  Ambulation: 300  ft   POST:  Rate/Rhythm: 90 SR  BP:  Sitting: 161/61       SaO2: 93% RA   Pt in bed. Pt ambulated 367ft with slow steady gait. Pt's son walked with pt holding her arm and shoulder. Pt's son very protective. Pt denied any complaints of CP, SOB or dizziness. Pt returned to bed per pt's request. Call bell within reach. Son at bedside.   [phone]    Carma Lair MS, ACSM CEP  12:19 PM 09/03/2018

## 2018-09-03 NOTE — Progress Notes (Signed)
Pt walked this AM w/ son and tech. Rolling walker, room air. Approx 470 ft.Tolerated well, assisted back to bed. Will continue to monitor.  Jaymes Graff, RN

## 2018-09-04 LAB — BASIC METABOLIC PANEL WITH GFR
Anion gap: 6 (ref 5–15)
BUN: 38 mg/dL — ABNORMAL HIGH (ref 8–23)
CO2: 25 mmol/L (ref 22–32)
Calcium: 8.3 mg/dL — ABNORMAL LOW (ref 8.9–10.3)
Chloride: 105 mmol/L (ref 98–111)
Creatinine, Ser: 1.92 mg/dL — ABNORMAL HIGH (ref 0.44–1.00)
GFR calc Af Amer: 30 mL/min — ABNORMAL LOW
GFR calc non Af Amer: 26 mL/min — ABNORMAL LOW
Glucose, Bld: 166 mg/dL — ABNORMAL HIGH (ref 70–99)
Potassium: 4.2 mmol/L (ref 3.5–5.1)
Sodium: 136 mmol/L (ref 135–145)

## 2018-09-04 LAB — GLUCOSE, CAPILLARY
GLUCOSE-CAPILLARY: 220 mg/dL — AB (ref 70–99)
Glucose-Capillary: 156 mg/dL — ABNORMAL HIGH (ref 70–99)
Glucose-Capillary: 149 mg/dL — ABNORMAL HIGH (ref 70–99)

## 2018-09-04 MED ORDER — FUROSEMIDE 40 MG PO TABS
40.0000 mg | ORAL_TABLET | Freq: Every day | ORAL | Status: DC
  Administered 2018-09-05 – 2018-09-08 (×4): 40 mg via ORAL
  Filled 2018-09-04 (×4): qty 1

## 2018-09-04 MED ORDER — INSULIN ASPART 100 UNIT/ML ~~LOC~~ SOLN
3.0000 [IU] | Freq: Three times a day (TID) | SUBCUTANEOUS | Status: DC
  Administered 2018-09-06 – 2018-09-13 (×19): 3 [IU] via SUBCUTANEOUS

## 2018-09-04 MED ORDER — FUROSEMIDE 10 MG/ML IJ SOLN
40.0000 mg | Freq: Once | INTRAMUSCULAR | Status: AC
  Administered 2018-09-04: 40 mg via INTRAVENOUS
  Filled 2018-09-04: qty 4

## 2018-09-04 MED ORDER — INSULIN DETEMIR 100 UNIT/ML ~~LOC~~ SOLN
10.0000 [IU] | Freq: Two times a day (BID) | SUBCUTANEOUS | Status: DC
  Administered 2018-09-04 – 2018-09-13 (×18): 10 [IU] via SUBCUTANEOUS
  Filled 2018-09-04 (×18): qty 0.1

## 2018-09-04 NOTE — Progress Notes (Signed)
      [address]       [place] [address]             [phone]      6 Days Post-Op Procedure(s) (LRB): CORONARY ARTERY BYPASS GRAFTING (CABG) x three, using left internal mammary artery and right leg greater saphenous vein harvested endoscopically (N/A) TRANSESOPHAGEAL ECHOCARDIOGRAM (TEE) (N/A)   Subjective:  Patient not feeling as well today.  Per her son, she did not sleep well last night.  She felt like she was short of breath.  Her energy level has also decreased today.  + ambulation + BM Objective: Vital signs in last 24 hours: Temp:  [97.8 F (36.6 C)-99.3 F (37.4 C)] 97.8 F (36.6 C) (11/10 0549) Pulse Rate:  [76-86] 78 (11/10 0831) Cardiac Rhythm: Normal sinus rhythm (11/10 0700) Resp:  [15-22] 15 (11/10 0831) BP: (147-175)/(44-106) 153/55 (11/10 0549) SpO2:  [93 %-98 %] 96 % (11/10 0831) Weight:  [72.3 kg] 72.3 kg (11/10 0500)  Intake/Output from previous day: 11/09 0701 - 11/10 0700 In: 960 [P.O.:960] Out: -   General appearance: alert, cooperative and no distress Heart: regular rate and rhythm Lungs: clear to auscultation bilaterally Abdomen: soft, non-tender; bowel sounds normal; no masses,  no organomegaly Extremities: edema trace Wound: clean and dry  Lab Results: Recent Labs    09/02/18 0518 09/03/18 0653  WBC 10.1 9.8  HGB 9.8* 11.1*  HCT 31.5* 35.2*  PLT 167 216   BMET:  Recent Labs    09/03/18 0653 09/04/18 0355  NA 138 136  K 3.6 4.2  CL 103 105  CO2 26 25  GLUCOSE 168* 166*  BUN 38* 38*  CREATININE 1.94* 1.92*  CALCIUM 8.7* 8.3*    PT/INR: No results for input(s): LABPROT, INR in the last 72 hours. ABG    Component Value Date/Time   PHART 7.342 (L) 08/30/2018 0133   HCO3 23.3 08/30/2018 0133   TCO2 25 08/30/2018 1523   ACIDBASEDEF 2.0 08/30/2018 0133   O2SAT 75.1 08/31/2018 0400   CBG (last 3)  Recent Labs    09/03/18 0648 09/03/18 1705 09/03/18 2118  GLUCAP 157* 145* 185*     Assessment/Plan: S/P Procedure(s) (LRB): CORONARY ARTERY BYPASS GRAFTING (CABG) x three, using left internal mammary artery and right leg greater saphenous vein harvested endoscopically (N/A) TRANSESOPHAGEAL ECHOCARDIOGRAM (TEE) (N/A)  1. CV- NSR with PVCs- on Coreg 25 mg BID, Norvasc 10 mg daily- will allow BP a little higher with renal insufficiency 2. Pulm- off oxygen, feels more short of breath this morning, continue IS 3. Renal- creatinine remains stable, edematous on exam, if weight is accurate gained 4 lbs yesterday, will give IV lasix today, repeat BMET in AM 4. GI- loose stools improved, denies N/V 5. DM- sugars controlled, one incidental sugar over 200, continue current regimen 6. Dispo- patient not feeling as well today, NSR with PVCs, continue Coreg, Norvasc, complains of dyspnea, weight went up 4 lbs yesterday, will repeat IV Lasix today, continue current care   LOS: 13 days    Joy Boyd 09/04/2018

## 2018-09-04 NOTE — Progress Notes (Signed)
    Chart reviewed.  No new suggestions.

## 2018-09-05 DIAGNOSIS — E669 Obesity, unspecified: Secondary | ICD-10-CM

## 2018-09-05 DIAGNOSIS — E7800 Pure hypercholesterolemia, unspecified: Secondary | ICD-10-CM

## 2018-09-05 DIAGNOSIS — E1169 Type 2 diabetes mellitus with other specified complication: Secondary | ICD-10-CM

## 2018-09-05 DIAGNOSIS — I25110 Atherosclerotic heart disease of native coronary artery with unstable angina pectoris: Principal | ICD-10-CM

## 2018-09-05 LAB — GLUCOSE, CAPILLARY
GLUCOSE-CAPILLARY: 175 mg/dL — AB (ref 70–99)
GLUCOSE-CAPILLARY: 161 mg/dL — AB (ref 70–99)
Glucose-Capillary: 213 mg/dL — ABNORMAL HIGH (ref 70–99)
Glucose-Capillary: 152 mg/dL — ABNORMAL HIGH (ref 70–99)
Glucose-Capillary: 114 mg/dL — ABNORMAL HIGH (ref 70–99)
Glucose-Capillary: 163 mg/dL — ABNORMAL HIGH (ref 70–99)

## 2018-09-05 LAB — BASIC METABOLIC PANEL
ANION GAP: 7 (ref 5–15)
BUN: 41 mg/dL — ABNORMAL HIGH (ref 8–23)
CALCIUM: 8.5 mg/dL — AB (ref 8.9–10.3)
CO2: 25 mmol/L (ref 22–32)
Chloride: 103 mmol/L (ref 98–111)
Creatinine, Ser: 1.77 mg/dL — ABNORMAL HIGH (ref 0.44–1.00)
GFR, EST AFRICAN AMERICAN: 34 mL/min — AB (ref >60)
GFR, EST NON AFRICAN AMERICAN: 29 mL/min — AB (ref >60)
Glucose, Bld: 127 mg/dL — ABNORMAL HIGH (ref 70–99)
Potassium: 4.9 mmol/L (ref 3.5–5.1)
SODIUM: 135 mmol/L (ref 135–145)

## 2018-09-05 MED ORDER — METOLAZONE 5 MG PO TABS
2.5000 mg | ORAL_TABLET | Freq: Every day | ORAL | Status: AC
  Administered 2018-09-06 – 2018-09-07 (×2): 2.5 mg via ORAL
  Filled 2018-09-05 (×2): qty 1

## 2018-09-05 MED ORDER — SIMETHICONE 80 MG PO CHEW
80.0000 mg | CHEWABLE_TABLET | Freq: Four times a day (QID) | ORAL | Status: DC | PRN
  Administered 2018-09-05 – 2018-09-15 (×12): 80 mg via ORAL
  Filled 2018-09-05 (×13): qty 1

## 2018-09-05 NOTE — Progress Notes (Addendum)
      [address]       [place] [address]             [phone]      7 Days Post-Op Procedure(s) (LRB): CORONARY ARTERY BYPASS GRAFTING (CABG) x three, using left internal mammary artery and right leg greater saphenous vein harvested endoscopically (N/A) TRANSESOPHAGEAL ECHOCARDIOGRAM (TEE) (N/A) Subjective: Having some incisional pain this morning.   Objective: Vital signs in last 24 hours: Temp:  [98.4 F (36.9 C)] 98.4 F (36.9 C) (11/10 1120) Pulse Rate:  [71-87] 87 (11/11 0803) Cardiac Rhythm: Normal sinus rhythm;Bundle branch block (11/11 0708) Resp:  [16-18] 18 (11/10 2310) BP: (141-182)/(51-67) 182/51 (11/11 0803) SpO2:  [93 %-95 %] 95 % (11/10 2045) Weight:  [73.8 kg] 73.8 kg (11/10 2100)     Intake/Output from previous day: 11/10 0701 - 11/11 0700 In: 480 [P.O.:480] Out: 450 [Urine:450] Intake/Output this shift: No intake/output data recorded.  General appearance: alert, cooperative and no distress Heart: regular rate and rhythm, S1, S2 normal, no murmur, click, rub or gallop Lungs: clear to auscultation bilaterally Abdomen: soft, non-tender; bowel sounds normal; no masses,  no organomegaly Extremities: 1+ nonpitting edema in lower extremities Wound: clean and dry  Lab Results: Recent Labs    09/03/18 0653  WBC 9.8  HGB 11.1*  HCT 35.2*  PLT 216   BMET:  Recent Labs    09/04/18 0355 09/05/18 0351  NA 136 135  K 4.2 4.9  CL 105 103  CO2 25 25  GLUCOSE 166* 127*  BUN 38* 41*  CREATININE 1.92* 1.77*  CALCIUM 8.3* 8.5*    PT/INR: No results for input(s): LABPROT, INR in the last 72 hours. ABG    Component Value Date/Time   PHART 7.342 (L) 08/30/2018 0133   HCO3 23.3 08/30/2018 0133   TCO2 25 08/30/2018 1523   ACIDBASEDEF 2.0 08/30/2018 0133   O2SAT 75.1 08/31/2018 0400   CBG (last 3)  Recent Labs    09/04/18 1605 09/04/18 2206 09/05/18 0557  GLUCAP 156* 149* 114*    Assessment/Plan: S/P Procedure(s)  (LRB): CORONARY ARTERY BYPASS GRAFTING (CABG) x three, using left internal mammary artery and right leg greater saphenous vein harvested endoscopically (N/A) TRANSESOPHAGEAL ECHOCARDIOGRAM (TEE) (N/A)  1. CV-NSR in the 80s, BP high this morning. Taking Norvasc, ASA, statin, Coreg, Plavix, and PRN hydralazine. Creatinine is still elevated-would not start an ACEI at this time. EPW have been removed 2. Pulm-off oxygen, continue incentive spirometer. No recent xray 3. Renal-creatinine 1.77, electrolytes okay. Remains about 8 lbs fluid overloaded. Will continue with oral Lasix today.  4. H and H 11.1/35.2, expected acute blood loss anemia 5. Blood glucose is moderately controlled. Continue Levemir and SSI coverage  Plan: Control BP better and utilize PRN hydralazine. Still having some incisional pain-continue oral pain medication. Continue diuresis for fluid overload.     LOS: 14 days    Elgie Collard 09/05/2018  Chart reviewed, patient examined, agree with above. She is slowly progressing. Ambulating but complains of shortness of breath and weak legs with ambulation. Vitals have been fine. She is still 8 lbs over preop. Will continue daily lasix and add low dose Metolazone for a couple day. Creatinine improving. Discussed status with son at bedside.

## 2018-09-05 NOTE — Progress Notes (Signed)
CARDIAC REHAB PHASE I   PRE:  Rate/Rhythm: 80 SR with PVCs    BP: sitting 144/59    SaO2: 95 RA  MODE:  Ambulation: 280 ft   POST:  Rate/Rhythm: 90 SR with PVCs    BP: sitting 144/59     SaO2: 95 RA  Pt moving fairly well, just weak and slow. Used RW. C/o her legs (? Weak-translator not with [place]). Return to bed, VSS. Encouraged x2 more walks. [id number]   [place], ACSM 09/05/2018 11:45 AM

## 2018-09-05 NOTE — Progress Notes (Signed)
Progress Note  Patient Name: Joy Boyd Date of Encounter: 09/05/2018  Primary Cardiologist: Mertie Moores, MD   Subjective   Complains of some incisional pain. Feels gassy when she eats. SOB with exertion  Inpatient Medications    Scheduled Meds: . amLODipine  10 mg Oral Daily  . aspirin EC  81 mg Oral Daily  . atorvastatin  80 mg Oral Daily  . budesonide  0.25 mg Inhalation BID  . carvedilol  25 mg Oral BID WC  . clopidogrel  75 mg Oral Daily  . enoxaparin (LOVENOX) injection  40 mg Subcutaneous QHS  . ferrous [id number] C-folic acid  1 capsule Oral BID WC  . furosemide  40 mg Oral Daily  . insulin aspart  0-24 Units Subcutaneous TID AC & HS  . insulin aspart  3 Units Subcutaneous TID WC  . insulin detemir  10 Units Subcutaneous BID  . pantoprazole  40 mg Oral QAC breakfast  . potassium chloride  40 mEq Oral Daily  . sodium chloride flush  3 mL Intravenous Q12H   Continuous Infusions: . sodium chloride     PRN Meds: sodium chloride, acetaminophen, hydrALAZINE, ondansetron **OR** ondansetron (ZOFRAN) IV, simethicone, sodium chloride flush, traMADol   Vital Signs    Vitals:   09/04/18 2045 09/04/18 2100 09/04/18 2310 09/05/18 0803  BP:   (!) 145/61 (!) 182/51  Pulse: 75   87  Resp: 16  18   Temp:      TempSrc:      SpO2: 95%     Weight:  73.8 kg    Height:        Intake/Output Summary (Last 24 hours) at 09/05/2018 0925 Last data filed at 09/04/2018 1400 Gross per 24 hour  Intake 240 ml  Output 450 ml  Net -210 ml   Filed Weights   09/03/18 0430 09/04/18 0500 09/04/18 2100  Weight: 70.6 kg 72.3 kg 73.8 kg    Telemetry    NSR. PVCs with bigeminy - Personally Reviewed  ECG    None today - Personally Reviewed  Physical Exam   GEN: No acute distress.   Neck: No JVD Cardiac: RRR, no murmurs, rubs, or gallops.  Respiratory: Clear to auscultation bilaterally. Median sternotomy scar is healing well GI: Soft, nontender, non-distended    MS: No edema; No deformity. RLE vein harvest site is healing well. Neuro:  Nonfocal  Psych: Normal affect   Labs    Chemistry Recent Labs  Lab 09/03/18 0653 09/04/18 0355 09/05/18 0351  NA 138 136 135  K 3.6 4.2 4.9  CL 103 105 103  CO2 26 25 25   GLUCOSE 168* 166* 127*  BUN 38* 38* 41*  CREATININE 1.94* 1.92* 1.77*  CALCIUM 8.7* 8.3* 8.5*  GFRNONAA 26* 26* 29*  GFRAA 30* 30* 34*  ANIONGAP 9 6 7      Hematology Recent Labs  Lab 09/01/18 0434 09/02/18 0518 09/03/18 0653  WBC 10.1 10.1 9.8  RBC 2.68* 3.53* 4.03  HGB 7.0* 9.8* 11.1*  HCT 24.4* 31.5* 35.2*  MCV 91.0 89.2 87.3  MCH 26.1 27.8 27.5  MCHC 28.7* 31.1 31.5  RDW 15.0 14.8 14.2  PLT 121* 167 216    Cardiac EnzymesNo results for input(s): TROPONINI in the last 168 hours. No results for input(s): TROPIPOC in the last 168 hours.   BNPNo results for input(s): BNP, PROBNP in the last 168 hours.   DDimer No results for input(s): DDIMER in the last 168 hours.   Radiology  No results found.  Cardiac Studies    Echo: Study Conclusions  - Left ventricle: The cavity size was at the upper limits of   normal. Wall thickness was increased in a pattern of mild LVH.   Systolic function was severely reduced. The estimated ejection   fraction was in the range of 15% to 20%. Diffuse hypokinesis.   Features are consistent with a pseudonormal left ventricular   filling pattern, with concomitant abnormal relaxation and   increased filling pressure (grade 2 diastolic dysfunction). - Mitral valve: There was mild regurgitation. Valve area by   pressure half-time: 1.95 cm^2. - Left atrium: The atrium was moderately dilated. - Right ventricle: Systolic function was mildly reduced. - Right atrium: The atrium was mildly to moderately dilated. - Pulmonary arteries: PA peak pressure: 34 mm Hg (S). - Impressions: There is sevre global LV dysfunction in the setting   of very frequent PVCs. 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  Impressions:  - There is sevre global LV dysfunction in the setting of very   frequent PVCs. 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   LEFT HEART CATH AND CORONARY ANGIOGRAPHY  Conclusion     Prox RCA lesion is 40% stenosed.  Previously placed Prox RCA to Mid RCA stent is 5% stenosed. Mid RCA to Dist RCA stent (unknown type) is widely patent.  Dist RCA lesion is 40% stenosed. Post Atrio lesion is 65% stenosed.  Mid LM to Ost LAD lesion is 85% stenosed with 75% stenosed side branch in Ost Cx.  Previously placed Prox LAD to Mid LAD stent (unknown type) is widely patent. Mid LAD stent is 15% stenosed.  Mid LAD to Dist LAD lesion is 45% stenosed.  Previously placed Prox Cx to Mid Cx stent is 5% stenosed.  LV end diastolic pressure is moderately elevated.   SUMMARY  Severe distal left main disease involving the ostium of both LAD and circumflex.  Mild in-stent restenosis in the LAD, circumflex and RCA stents with moderate disease beyond the stent in the LAD.  Echocardiographically severe Cardiomyopathy with Ejection Fraction of roughly 20%.    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   RECOMMENDATIONS  The patient will be transferred to 2 H CCU for closer monitoring.  Since she remains hemodynamically stable and not having active angina, I chose not to place balloon pump.  Restart heparin 8 hours after sheath removal.  Have consult to CVTS (Dr. Cyndia Bent) to weigh in on possible surgical options.  I will also change her rounding MD to Dr. Angelena Form to allow for interventional input. ? As discussed with the family: Options are as follows: 1. Medical management which would lead to worsening symptoms and heart failure 2. CABG -Dr. Cyndia Bent voices concerns given reduced EF that this may not provide enough benefit for the risks 3. UNPROTECTED LEFT MAIN PCI -which would likely require Impella  support given reduced EF.  (Will need to review with interventional colleagues to see if this is a feasible option.   For now, we will discontinue Plavix and continue aspirin.  Otherwise continue aggressive cardiac risk factor modification.  Recommend Aspirin 81mg  daily for severe CAD -Plavix currently on hold pending CT surgical evaluation, if not deemed to be surgical option, would restart/reload Plavix..       Patient Profile     67 y.o. female presented with USAP and found to have severe 3 vessel CAD.  Assessment & Plan    1. USAP. Severe 3  vessel CAD. S/p CABG on 08/29/18. Clinically making good progress. On  ASA, Plavix, statin. On Coreg. Anticipate DC in next 24-48 hours 2. Acute on CKD. Baseline creatinine 1.5-1.7. Trending down. Close to baseline 3. HTN. Labile on Coreg, amlodipine, lasix. Hold ARB/ACEi for now due to CKD.  4. Ischemic CM with acute systolic CHF. Mildly volume overloaded. Receiving lasix.  5. DM. Would consider SGLT 2 inhibitor as outpatient once renal function stabilized.   [facility] will sign off.   Medication Recommendations:  As noted above Other recommendations (labs, testing, etc):  Repeat BMET as outpatient in 1-2 weeks. Follow up as an outpatient:  2 weeks with [facility]. Dr. Acie Fredrickson  For questions or updates, please contact [place] Please consult [email or web address] for contact info under    CARDIOLOGY RECOMMENDATIONS:  Discharge is anticipated in the next 48 hours. Recommendations for medications and follow up:  Discharge Medications: Continue medications as they are currently listed in the [facility]. Exceptions to the above:  none  Follow Up: The patient's Primary Cardiologist is Mertie Moores, MD   Follow up in the office in 2 week(s).  Signed,  Katilyn Miltenberger [place], MD  9:34 AM 09/05/2018  [facility]     Signed, Shelina Luo [place], MD  09/05/2018, 9:25 AM

## 2018-09-06 LAB — GLUCOSE, CAPILLARY
GLUCOSE-CAPILLARY: 144 mg/dL — AB (ref 70–99)
Glucose-Capillary: 113 mg/dL — ABNORMAL HIGH (ref 70–99)
Glucose-Capillary: 239 mg/dL — ABNORMAL HIGH (ref 70–99)
Glucose-Capillary: 198 mg/dL — ABNORMAL HIGH (ref 70–99)

## 2018-09-06 MED ORDER — HYDRALAZINE HCL 25 MG PO TABS
25.0000 mg | ORAL_TABLET | Freq: Three times a day (TID) | ORAL | Status: DC
  Administered 2018-09-06 – 2018-09-15 (×28): 25 mg via ORAL
  Filled 2018-09-06 (×28): qty 1

## 2018-09-06 MED ORDER — HYDRALAZINE HCL 20 MG/ML IJ SOLN: 10.0000 mg | Freq: Three times a day (TID) | INTRAMUSCULAR | Status: DC

## 2018-09-06 NOTE — Progress Notes (Signed)
CARDIAC REHAB PHASE I   PRE:  Rate/Rhythm: 73 SR with PVC    BP: sitting 140/39    SaO2: 96 RA  MODE:  Ambulation: 420 ft   POST:  Rate/Rhythm: 85 trigeminy    BP: sitting 102/50     SaO2: 98 RA  Pt ready to walk, feeling some stronger initially. Had her walk farther and she did feel poorly toward end of walk. BP lower. Pt daughter in law sts that often pt feels like her head is tight with talking or after a walk. Apparently this has been ongoing before and after surgery. She also c/o leg pain. To recliner. [phone]   [place], ACSM 09/06/2018 10:58 AM

## 2018-09-06 NOTE — Progress Notes (Signed)
      [address]       [place], [address]             [phone]        8 Days Post-Op Procedure(s) (LRB): CORONARY ARTERY BYPASS GRAFTING (CABG) x three, using left internal mammary artery and right leg greater saphenous vein harvested endoscopically (N/A) TRANSESOPHAGEAL ECHOCARDIOGRAM (TEE) (N/A)  Subjective: Patient's children who speak English at bedside. Mom has no complaints this am.  Objective: Vital signs in last 24 hours: Temp:  [98.2 F (36.8 C)-98.6 F (37 C)] 98.2 F (36.8 C) (11/12 0550) Pulse Rate:  [78-87] 81 (11/12 0812) Cardiac Rhythm: Normal sinus rhythm (11/12 0808) Resp:  [13-18] 13 (11/12 0550) BP: (125-160)/(45-106) 156/49 (11/12 0812) SpO2:  [94 %-98 %] 98 % (11/12 0550) Weight:  [71.7 kg] 71.7 kg (11/12 0550)  Pre op weight 69.9 kg Current Weight  09/06/18 71.7 kg      Intake/Output from previous day: 11/11 0701 - 11/12 0700 In: 480 [P.O.:480] Out: 800 [Urine:800]   Physical Exam:  Cardiovascular: RRR Pulmonary: Clear to auscultation bilaterally Abdomen: Soft, non tender, bowel sounds present. Extremities: Mild bilateral lower extremity edema. Wounds: Clean and dry.  No erythema or signs of infection.  Lab Results: CBC:No results for input(s): WBC, HGB, HCT, PLT in the last 72 hours. BMET:  Recent Labs    09/04/18 0355 09/05/18 0351  NA 136 135  K 4.2 4.9  CL 105 103  CO2 25 25  GLUCOSE 166* 127*  BUN 38* 41*  CREATININE 1.92* 1.77*  CALCIUM 8.3* 8.5*    PT/INR:  Lab Results  Component Value Date   INR 1.38 08/29/2018   INR 0.99 08/29/2018   ABG:  INR: Will add last result for INR, ABG once components are confirmed Will add last 4 CBG results once components are confirmed  Assessment/Plan:  1. CV - Bigeminy. Hypertensive. On Coreg 25 mg bid, Amlodipine 10 mg daily,Plavix 75 mg daily. Will not start ACE or ARB as CKD (elevated creatinine). Will add scheduled Hydralazine 2.  Pulmonary - On room  air. Encourage incentive spirometer 3. Renal-CKD stage II-III. Creatinine decreased from 1.92 to 1.77. Admission creatinine 1.71. 4.  Acute blood loss anemia - Last H and H 11.1 and 35.2 5. DM-CBGs 163/161/113. On Insulin  Pre op HGA1C 9.3. She will need close medical follow up after discharge. 6. Volume overload-continue with Lasix and Metolazone  Caidin Heidenreich M ZimmermanPA-C 09/06/2018,8:19 AM [phone]

## 2018-09-07 LAB — BASIC METABOLIC PANEL
Anion gap: 6 (ref 5–15)
BUN: 47 mg/dL — AB (ref 8–23)
CALCIUM: 8.7 mg/dL — AB (ref 8.9–10.3)
CO2: 24 mmol/L (ref 22–32)
Chloride: 102 mmol/L (ref 98–111)
Creatinine, Ser: 2.24 mg/dL — ABNORMAL HIGH (ref 0.44–1.00)
GFR calc Af Amer: 25 mL/min — ABNORMAL LOW (ref >60)
GFR calc non Af Amer: 22 mL/min — ABNORMAL LOW (ref >60)
GLUCOSE: 140 mg/dL — AB (ref 70–99)
Potassium: 5.8 mmol/L — ABNORMAL HIGH (ref 3.5–5.1)
SODIUM: 132 mmol/L — AB (ref 135–145)

## 2018-09-07 LAB — GLUCOSE, CAPILLARY
GLUCOSE-CAPILLARY: 130 mg/dL — AB (ref 70–99)
Glucose-Capillary: 199 mg/dL — ABNORMAL HIGH (ref 70–99)
Glucose-Capillary: 199 mg/dL — ABNORMAL HIGH (ref 70–99)
Glucose-Capillary: 143 mg/dL — ABNORMAL HIGH (ref 70–99)

## 2018-09-07 MED ORDER — POLYETHYLENE GLYCOL 3350 17 G PO PACK
17.0000 g | PACK | Freq: Every day | ORAL | Status: DC
  Administered 2018-09-07 – 2018-09-22 (×10): 17 g via ORAL
  Filled 2018-09-07 (×13): qty 1

## 2018-09-07 NOTE — Progress Notes (Addendum)
      [address]       [place] [address]             [phone]      9 Days Post-Op Procedure(s) (LRB): CORONARY ARTERY BYPASS GRAFTING (CABG) x three, using left internal mammary artery and right leg greater saphenous vein harvested endoscopically (N/A) TRANSESOPHAGEAL ECHOCARDIOGRAM (TEE) (N/A) Subjective: Still short of breath when ambulating in the halls  Objective: Vital signs in last 24 hours: Temp:  [98.1 F (36.7 C)-98.9 F (37.2 C)] 98.1 F (36.7 C) (11/13 0600) Pulse Rate:  [75-81] 79 (11/13 0600) Cardiac Rhythm: Normal sinus rhythm;Bundle branch block;Other (Comment) (11/12 1900) Resp:  [14-23] 14 (11/13 0600) BP: (113-156)/(39-68) 146/49 (11/13 0600) SpO2:  [97 %-98 %] 97 % (11/13 0712) Weight:  [72 kg] 72 kg (11/13 0500)     Intake/Output from previous day: 11/12 0701 - 11/13 0700 In: 240 [P.O.:240] Out: 450 [Urine:450] Intake/Output this shift: No intake/output data recorded.  General appearance: alert, cooperative and no distress Heart: regular rate and rhythm, S1, S2 normal, no murmur, click, rub or gallop Lungs: clear to auscultation bilaterally Abdomen: soft, non-tender; bowel sounds normal; no masses,  no organomegaly Extremities: extremities normal, atraumatic, no cyanosis or edema Wound: clean and dry  Lab Results: No results for input(s): WBC, HGB, HCT, PLT in the last 72 hours. BMET:  Recent Labs    09/05/18 0351 09/07/18 0321  NA 135 132*  K 4.9 5.8*  CL 103 102  CO2 25 24  GLUCOSE 127* 140*  BUN 41* 47*  CREATININE 1.77* 2.24*  CALCIUM 8.5* 8.7*    PT/INR: No results for input(s): LABPROT, INR in the last 72 hours. ABG    Component Value Date/Time   PHART 7.342 (L) 08/30/2018 0133   HCO3 23.3 08/30/2018 0133   TCO2 25 08/30/2018 1523   ACIDBASEDEF 2.0 08/30/2018 0133   O2SAT 75.1 08/31/2018 0400   CBG (last 3)  Recent Labs    09/06/18 1652 09/06/18 2132 09/07/18 0601  GLUCAP 144* 198* 130*     Assessment/Plan: S/P Procedure(s) (LRB): CORONARY ARTERY BYPASS GRAFTING (CABG) x three, using left internal mammary artery and right leg greater saphenous vein harvested endoscopically (N/A) TRANSESOPHAGEAL ECHOCARDIOGRAM (TEE) (N/A)  1. CV - Bigeminy. Hypertensive-better since the addition of hydralazine. On Coreg 25 mg bid, Amlodipine 10 mg daily,Plavix 75 mg daily. Will not start ACE or ARB as CKD (elevated creatinine).  2.  Pulmonary - On room air. Encourage incentive spirometer 3. Renal-CKD stage II-III. Creatinine up to 2.24. Admission creatinine 1.71. Remains about 8 lbs fluid overloaded 4.  Acute blood loss anemia - Last H and H 11.1 and 35.2 5. DM-CBGs 144/198/130. On Insulin  Pre op HGA1C 9.3. She will need close medical follow up after discharge. 6. Volume overload-continue with Lasix and Metolazone  Plan: Continue diuretic regimen. I will order another BMET for tomorrow since creatine up a little this morning. Will add miralax for constipation. Ambulate in the halls. She is using her incentive spirometer.    LOS: 16 days    Elgie Collard 09/07/2018   Chart reviewed, patient examined, agree with above. Follow up BMET in am and will get a CXR since she still complains of SOB with ambulating.

## 2018-09-07 NOTE — Progress Notes (Signed)
Pt just walked 280 ft with her son. This was her second walk. They will walk again at 6. Will f/u tomorrow. Pts son sts it is best to do education through his brother, who has signed the translation waiver. Yves Dill CES, ACSM 1:31 PM 09/07/2018

## 2018-09-08 ENCOUNTER — Inpatient Hospital Stay (HOSPITAL_COMMUNITY): Payer: Medicaid Other

## 2018-09-08 LAB — BASIC METABOLIC PANEL
ANION GAP: 8 (ref 5–15)
BUN: 48 mg/dL — ABNORMAL HIGH (ref 8–23)
CALCIUM: 8.7 mg/dL — AB (ref 8.9–10.3)
CO2: 22 mmol/L (ref 22–32)
Chloride: 101 mmol/L (ref 98–111)
Creatinine, Ser: 2.25 mg/dL — ABNORMAL HIGH (ref 0.44–1.00)
GFR calc Af Amer: 25 mL/min — ABNORMAL LOW (ref >60)
GFR, EST NON AFRICAN AMERICAN: 22 mL/min — AB (ref >60)
Glucose, Bld: 153 mg/dL — ABNORMAL HIGH (ref 70–99)
Potassium: 5.8 mmol/L — ABNORMAL HIGH (ref 3.5–5.1)
SODIUM: 131 mmol/L — AB (ref 135–145)

## 2018-09-08 LAB — GLUCOSE, CAPILLARY
GLUCOSE-CAPILLARY: 139 mg/dL — AB (ref 70–99)
GLUCOSE-CAPILLARY: 226 mg/dL — AB (ref 70–99)
GLUCOSE-CAPILLARY: 159 mg/dL — AB (ref 70–99)
Glucose-Capillary: 141 mg/dL — ABNORMAL HIGH (ref 70–99)

## 2018-09-08 NOTE — Progress Notes (Signed)
CARDIAC REHAB PHASE I   PRE:  Rate/Rhythm: 73 SR    BP: sitting 136/58    SaO2: 96 RA  MODE:  Ambulation: 470 ft   POST:  Rate/Rhythm: 78 SR    BP: sitting 134/60     SaO2: 96 RA  Pt doing better today. Able to walk without RW and at quicker pace. Rest x2. Got lightheaded when she turned too quick. VSS. This was third walk today. Ed completed with her two sons (Horam translated to pt-he signed waiver before surgery). Discussed restrictions, diet, ex, daily wts and HF booklet, and CRPII. Pt will probably need to do CRPII in [place] however I will send referral. [id number]   [place], ACSM 09/08/2018 12:59 PM

## 2018-09-08 NOTE — Progress Notes (Addendum)
Joy Boyd       [place] [address]             [phone]      10 Days Post-Op Procedure(s) (LRB): CORONARY ARTERY BYPASS GRAFTING (CABG) x three, using left internal mammary artery and right leg greater saphenous vein harvested endoscopically (N/A) TRANSESOPHAGEAL ECHOCARDIOGRAM (TEE) (N/A) Subjective: Worried this morning about having stairs at her house.  Objective: Vital signs in last 24 hours: Temp:  [97.8 F (36.6 C)-98 F (36.7 C)] 98 F (36.7 C) (11/14 0445) Pulse Rate:  [66-86] 82 (11/14 0445) Cardiac Rhythm: Normal sinus rhythm;Bundle branch block (11/13 1900) Resp:  [16-24] 24 (11/14 0445) BP: (111-143)/(44-64) 143/55 (11/14 0445) SpO2:  [94 %-98 %] 98 % (11/14 0445) Weight:  [72.1 kg] 72.1 kg (11/14 0437)     Intake/Output from previous day: 11/13 0701 - 11/14 0700 In: 480 [P.O.:480] Out: 300 [Urine:300] Intake/Output this shift: No intake/output data recorded.  General appearance: alert, cooperative and no distress Heart: regular rate and rhythm, S1, S2 normal, no murmur, click, rub or gallop Lungs: clear to auscultation bilaterally Abdomen: soft, non-tender; bowel sounds normal; no masses,  no organomegaly Extremities: extremities normal, atraumatic, no cyanosis or edema Wound: clean and dry  Lab Results: No results for input(s): WBC, HGB, HCT, PLT in the last 72 hours. BMET:  Recent Labs    09/07/18 0321 09/08/18 0409  NA 132* 131*  K 5.8* 5.8*  CL 102 101  CO2 24 22  GLUCOSE 140* 153*  BUN 47* 48*  CREATININE 2.24* 2.25*  CALCIUM 8.7* 8.7*    PT/INR: No results for input(s): LABPROT, INR in the last 72 hours. ABG    Component Value Date/Time   PHART 7.342 (L) 08/30/2018 0133   HCO3 23.3 08/30/2018 0133   TCO2 25 08/30/2018 1523   ACIDBASEDEF 2.0 08/30/2018 0133   O2SAT 75.1 08/31/2018 0400   CBG (last 3)  Recent Labs    09/07/18 1621 09/07/18 2007 09/08/18 0626  GLUCAP 199* 143* 139*     Assessment/Plan: S/P Procedure(s) (LRB): CORONARY ARTERY BYPASS GRAFTING (CABG) x three, using left internal mammary artery and right leg greater saphenous vein harvested endoscopically (N/A) TRANSESOPHAGEAL ECHOCARDIOGRAM (TEE) (N/A)  1. CV -Bigeminy. Hypertensive-better since the addition of hydralazine. On Coreg 25 mg bid, Amlodipine 10 mg daily,Plavix 75 mg daily. Will not start ACE or ARB as CKD (elevated creatinine).  2. Pulmonary - On room air. Encourage incentive spirometer 3.Renal-CKD stage II-III. Creatinine up to 2.25. Admission creatinine 1.71. Remains about 8 lbs fluid overloaded. Hyperkalemia-holding supplementation.  4. Acute blood loss anemia - Last H and H 11.1 and 35.2 5. DM-CBGs 144/198/130. On Insulin  Pre op HGA1C 9.3. She will need close medical follow up after discharge. 6. Volume overload-continue with Lasix and Metolazone. Weight has remained the same the last few days.   Plan: Consult to PT for work with stairs today. CXR is okay with a small left pleural effusion. Patient states her shortness of breath is getting better. Ambulate in the halls today. Continue diuretics. + BM with addition of miralax yesterday but she states her abdomen feels full of gas. Continue simethicone tablets and ambulation should help with this. Creatinine still above baseline but stable on BMET today.    LOS: 17 days    Elgie Collard 09/08/2018   Chart reviewed, patient examined, agree with above. She continues to improve. Ambulating better.  Creatinine up to 2.25 and K+5.8 today  with diuresis. Has not has any +K replacement. Will repeat in am. Hopefully she can go home tomorrow if no changes.

## 2018-09-09 LAB — BASIC METABOLIC PANEL
Anion gap: 9 (ref 5–15)
BUN: 47 mg/dL — AB (ref 8–23)
CALCIUM: 8.7 mg/dL — AB (ref 8.9–10.3)
CO2: 22 mmol/L (ref 22–32)
CREATININE: 2.63 mg/dL — AB (ref 0.44–1.00)
Chloride: 102 mmol/L (ref 98–111)
GFR calc non Af Amer: 18 mL/min — ABNORMAL LOW (ref >60)
GFR, EST AFRICAN AMERICAN: 21 mL/min — AB (ref >60)
Glucose, Bld: 133 mg/dL — ABNORMAL HIGH (ref 70–99)
Potassium: 5.5 mmol/L — ABNORMAL HIGH (ref 3.5–5.1)
SODIUM: 133 mmol/L — AB (ref 135–145)

## 2018-09-09 LAB — GLUCOSE, CAPILLARY
GLUCOSE-CAPILLARY: 122 mg/dL — AB (ref 70–99)
GLUCOSE-CAPILLARY: 193 mg/dL — AB (ref 70–99)
GLUCOSE-CAPILLARY: 91 mg/dL (ref 70–99)
GLUCOSE-CAPILLARY: 178 mg/dL — AB (ref 70–99)

## 2018-09-09 MED ORDER — HYDRALAZINE HCL 25 MG PO TABS: 25.0000 mg | ORAL_TABLET | Freq: Three times a day (TID) | ORAL | 1 refills | Status: DC

## 2018-09-09 MED ORDER — AMLODIPINE BESYLATE 10 MG PO TABS: 10.0000 mg | ORAL_TABLET | Freq: Every day | ORAL | 1 refills | Status: DC

## 2018-09-09 MED ORDER — ATORVASTATIN CALCIUM 80 MG PO TABS: 80.0000 mg | ORAL_TABLET | Freq: Every day | ORAL | 1 refills | Status: DC

## 2018-09-09 MED ORDER — ASPIRIN 81 MG PO TBEC: 81.0000 mg | DELAYED_RELEASE_TABLET | Freq: Every day | ORAL | 1 refills | Status: AC

## 2018-09-09 MED ORDER — TRAMADOL HCL 50 MG PO TABS: 50.0000 mg | ORAL_TABLET | Freq: Two times a day (BID) | ORAL | 0 refills | Status: DC | PRN

## 2018-09-09 MED ORDER — CARVEDILOL 25 MG PO TABS: 25.0000 mg | ORAL_TABLET | Freq: Two times a day (BID) | ORAL | 1 refills | Status: DC

## 2018-09-09 MED ORDER — MOVING RIGHT ALONG BOOK
Freq: Once | Status: AC
  Administered 2018-09-09: 19:00:00
  Filled 2018-09-09: qty 1

## 2018-09-09 MED ORDER — ACETAMINOPHEN 325 MG PO TABS: 650.0000 mg | ORAL_TABLET | Freq: Four times a day (QID) | ORAL | Status: AC | PRN

## 2018-09-09 MED FILL — CARVEDILOL 25 MG TABLET: 25 | 60 days supply | Qty: 120 | Fill #0

## 2018-09-09 MED FILL — hydrALAZINE HCL 25 MG TABS: 25 | 60 days supply | Qty: 180 | Fill #0

## 2018-09-09 MED FILL — AMLODIPINE BESYLATE 10 MG T: 10 | 30 days supply | Qty: 60 | Fill #0

## 2018-09-09 MED FILL — ATORVASTATIN CALCIUM 80 MG: 80 | 30 days supply | Qty: 60 | Fill #0

## 2018-09-09 MED FILL — traMADol HCL 50 MG TABS: 50 | 15 days supply | Qty: 30 | Fill #0

## 2018-09-09 NOTE — Progress Notes (Signed)
CARDIAC REHAB PHASE I   PRE:  Rate/Rhythm: 70 SR    BP: sitting 109/52    SaO2: 93 RA  MODE:  Ambulation: 90 ft   POST:  Rate/Rhythm: 80 SR    BP: sitting 125/55     SaO2: 94 RA  Pt willing to walk. Needed assist to sit up in bed and we discussed how she can roll to do this more independently. She walked 90 ft independently however she suddenly had a significant pain in her side or stomach that made her feel weak and requested a chair. With sitting she felt diaphoretic and weak, denied dizziness. She could not walk anymore and we rolled her back to room. She requested to go to BR but did not have any results in the BR. Felt better. To recliner, BP now 125/55. Daughter in law translated. This stomach pain or gas has been an issue post op and daughter in law sts it was a problem PTA as well.  [place], ACSM 09/09/2018 2:50 PM

## 2018-09-09 NOTE — Evaluation (Addendum)
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 PMH - DM, CAD, HTN, cardiac stents  Clinical Impression  Pt able to mobilize adequately in hallways and able to negotiate stairs. Recommend rollator for home to allow community access and to allow progressive level of increased activity.     Follow Up Recommendations No PT follow up;Supervision - Intermittent    Equipment Recommendations  Other (comment)(rollator)    Recommendations for Other Services       Precautions / Restrictions Precautions Precautions: Sternal      Mobility  Bed Mobility               General bed mobility comments: Pt up in chair  Transfers Overall transfer level: Needs assistance Equipment used: None Transfers: Sit to/from Stand Sit to Stand: Supervision         General transfer comment: supervision for safety and reassurance. Pt able to rise following sternal precautions  Ambulation/Gait Ambulation/Gait assistance: Supervision Gait Distance (Feet): 450 Feet Assistive device: 4-wheeled walker;None Gait Pattern/deviations: Step-through pattern;Decreased stride length Gait velocity: decr Gait velocity interpretation: 1.31 - 2.62 ft/sec, indicative of limited community ambulator General Gait Details: Supervision for safety. No loss of balance but steadier and more confident with use of rollator.   Stairs Stairs: Yes Stairs assistance: Min guard Stair Management: One rail Left;Step to pattern;Forwards Number of Stairs: 12 General stair comments: Assist for safety. Pt able to use rail for balance but doesn't pull up with rail  Wheelchair Mobility    Modified Rankin (Stroke Patients Only)       Balance Overall balance assessment: Mild deficits observed, not formally tested                                           Pertinent  Vitals/Pain Pain Assessment: 0-10 Pain Score: 5  Pain Location: chest incision Pain Descriptors / Indicators: Operative site guarding Pain Intervention(s): Limited activity within patient's tolerance;Premedicated before session    Home Living Family/patient expects to be discharged to:: Private residence Living Arrangements: Spouse/significant other;Children Available Help at Discharge: Family;Available 24 hours/day Type of Home: Apartment Home Access: Stairs to enter Entrance Stairs-Rails: Right Entrance Stairs-Number of Steps: 12-14 Home Layout: One level Home Equipment: None      Prior Function Level of Independence: Independent         Comments: Limited endurance     Hand Dominance        Extremity/Trunk Assessment   Upper Extremity Assessment Upper Extremity Assessment: Generalized weakness    Lower Extremity Assessment Lower Extremity Assessment: Generalized weakness       Communication   Communication: Prefers language other than English  Cognition Arousal/Alertness: Awake/alert Behavior During Therapy: WFL for tasks assessed/performed Overall Cognitive Status: Within Functional Limits for tasks assessed                                        General Comments      Exercises     Assessment/Plan    PT Assessment Patent does not need any further PT services  PT Problem List         PT Treatment Interventions      PT Goals (Current goals  can be found in the Care Plan section)  Acute Rehab PT Goals PT Goal Formulation: All assessment and education complete, DC therapy    Frequency     Barriers to discharge        Co-evaluation               AM-PAC PT "6 Clicks" Daily Activity  Outcome Measure Difficulty turning over in bed (including adjusting bedclothes, sheets and blankets)?: A Little Difficulty moving from lying on back to sitting on the side of the bed? : A Little Difficulty sitting down on and standing up  from a chair with arms (e.g., wheelchair, bedside commode, etc,.)?: A Little Help needed moving to and from a bed to chair (including a wheelchair)?: A Little Help needed walking in hospital room?: A Little Help needed climbing 3-5 steps with a railing? : A Little 6 Click Score: 18    End of Session Equipment Utilized During Treatment: Gait belt Activity Tolerance: Patient tolerated treatment well Patient left: in bed;with call bell/phone within reach;with family/visitor present(sitting EOB) Nurse Communication: Mobility status PT Visit Diagnosis: Other abnormalities of gait and mobility (R26.89)    Time: 9450-3888 PT Time Calculation (min) (ACUTE ONLY): 19 min   Charges:   PT Evaluation $PT Eval Moderate Complexity: [place] Pager [phone] Office [place] 09/09/2018, 10:24 AM

## 2018-09-09 NOTE — Care Management Note (Signed)
Case Management Note Initial CM note completed by [name] RN, BSN, NCM-BC, ACM-RN [phone] 08/31/2018, 1:13 PM  Patient Details  Name: Joy Boyd MRN: [id number] Date of Birth: 11/08/1950  Subjective/Objective:   67yo female presented with atypical chest pain; s/p CABG x 3.                 Action/Plan: CM attempted to meet with patient to discuss transitional needs. Patient asleep at this time, with visitor at her bedside. Son has signed waiver for educational purposes, but isn't available to discuss POC. Patient was visiting her son and daughter-in-law from [place], and has no local PCP or insurance payor listed. CM will continue to follow for dispositional needs.   Expected Discharge Date:                  Expected Discharge Plan:  Home/Self Care  In-House Referral:  NA  Discharge planning Services  CM Consult  Post Acute Care Choice:  Durable Medical Equipment Choice offered to:  Adult Children  DME Arranged:  [facility] rolling DME Agency:  [place] Arranged:  NA [place] Agency:  NA  Status of Service:  In process, will continue to follow  If discussed at Long Length of Stay Meetings, dates discussed:    Discharge Disposition: home/self care    Additional Comments:  09/09/18- 1630 -Marvetta Gibbons RN, CM-  Pt for possible discharge over weekend have asked [facility] pharmacy to see pt regarding medication needs- will plan to fill medication today - per PT pt would benefit from rollator have asked Butch Penny with [facility] to check on this to see if charity will cover- per Butch Penny pt was not approved for rollator but can receive a RW for home under charity. Spoke with DIL at bedside and medications have been delivered to room, provided DIL with list of clinics should pt need to see a primary MD prior to return home, pt has f/u appointments with surgeon and cards. Have also let DIL know about rollator- they will accept RW for discharge. Pt will return home with family.    09/05/18- 1500- Ensley Blas RN, CM-  Spoke with pt's sons regarding transition of care needs- discussed medication needs, primary care doctor needs, cost of seeing doctors, medical bills, DME and HH needs- explained to sons that pt since pt is not a citizen would not qualify for most assistance programs and cost would be out of pocket. CM did provide son with [facility] name and phone # to f/u on any assistance programs that may be available after they receive hospital bill. Pt will need RW will ask [facility] to see if pt will qualify for charity. Sons have requested medication refills to get pt through until return to her country. [employer] will only cover 30 day supply- and explained to sons if more cost will be out of pocket. CM will continue to follow.   09/01/18 @ 1238-Natalie Gay RNCM-CM met with patient's son Joy Boyd, who arrived from [place] earlier this week to support his family during patient's hospitalization. CM provided contact information to be given to patient's son Joy Boyd, whom she was visiting PTA to discuss patient's POC. CM team will continue following.  Marvetta Gibbons RN, BSN Transitions of Care Unit 4E- RN Case Manager [phone] 09/09/2018, 4:49 PM

## 2018-09-09 NOTE — Progress Notes (Addendum)
[address]       [place] [address]             [phone]      11 Days Post-Op Procedure(s) (LRB): CORONARY ARTERY BYPASS GRAFTING (CABG) x three, using left internal mammary artery and right leg greater saphenous vein harvested endoscopically (N/A) TRANSESOPHAGEAL ECHOCARDIOGRAM (TEE) (N/A) Subjective: No issues overnight. PT never came to evaluate.  Objective: Vital signs in last 24 hours: Temp:  [97.8 F (36.6 C)-98.1 F (36.7 C)] 98.1 F (36.7 C) (11/15 0421) Pulse Rate:  [74-80] 75 (11/15 0421) Cardiac Rhythm: Normal sinus rhythm;Bundle branch block (11/14 1900) Resp:  [14-19] 14 (11/15 0421) BP: (110-150)/(44-60) 142/59 (11/15 0421) SpO2:  [94 %-95 %] 94 % (11/15 0421) Weight:  [72.3 kg] 72.3 kg (11/15 0421)     Intake/Output from previous day: 11/14 0701 - 11/15 0700 In: 240 [P.O.:240] Out: -  Intake/Output this shift: No intake/output data recorded.  General appearance: alert, cooperative and no distress Heart: regular rate and rhythm, S1, S2 normal, no murmur, click, rub or gallop Lungs: clear to auscultation bilaterally Abdomen: soft, non-tender; bowel sounds normal; no masses,  no organomegaly Extremities: extremities normal, atraumatic, no cyanosis or edema Wound: clean and dry  Lab Results: No results for input(s): WBC, HGB, HCT, PLT in the last 72 hours. BMET:  Recent Labs    09/08/18 0409 09/09/18 0332  NA 131* 133*  K 5.8* 5.5*  CL 101 102  CO2 22 22  GLUCOSE 153* 133*  BUN 48* 47*  CREATININE 2.25* 2.63*  CALCIUM 8.7* 8.7*    PT/INR: No results for input(s): LABPROT, INR in the last 72 hours. ABG    Component Value Date/Time   PHART 7.342 (L) 08/30/2018 0133   HCO3 23.3 08/30/2018 0133   TCO2 25 08/30/2018 1523   ACIDBASEDEF 2.0 08/30/2018 0133   O2SAT 75.1 08/31/2018 0400   CBG (last 3)  Recent Labs    09/08/18 1626 09/08/18 2213 09/09/18 0627  GLUCAP 159* 141* 122*    Assessment/Plan: S/P  Procedure(s) (LRB): CORONARY ARTERY BYPASS GRAFTING (CABG) x three, using left internal mammary artery and right leg greater saphenous vein harvested endoscopically (N/A) TRANSESOPHAGEAL ECHOCARDIOGRAM (TEE) (N/A)  1. CV -Ventricular Bigeminy. Hypertensive-better since the addition of hydralazine. On Coreg 25 mg bid, Amlodipine 10 mg daily,Plavix 75 mg daily. Will not start ACE or ARB as CKD (elevated creatinine).  2. Pulmonary - On room air. Encourage incentive spirometer 3.Renal-CKD stage II-III. Creatinineup to 2.63. Admission creatinine 1.71.Remains about 8 lbs fluid overloaded. Hyperkalemia-holding supplementation.  4. Acute blood loss anemia - Last H and H 11.1 and 35.2 5. DM-CBGs 159/141/122. On Insulin  Pre op HGA1C 9.3. She will need close medical follow up after discharge. 6. Volume overload-continue with Lasix. Weight has remained the same the last few days.  Plan: Potassium 5.5 today and trending down. Creatinine increased a little to 2.6. We will keep her one more day to monitor her kidney function. BMET tomorrow morning.     LOS: 18 days    Joy Boyd 09/09/2018   Chart reviewed, patient examined, agree with above. She felt well this am and walked up stairs with PT. This afternoon she doesn't feel as well, complaining of gas pains. No BM in a few days. Feels better after Mylicon. Will give her a laxative. Her family has a lot of questions about her DM. She was on 70/30 insulin at home but rarely ever checked  CBG. Now on Levemir and meal coverage/SSI with CBG's 4 times a day. They are not sure how to transition her to 70/30. We could have diabetes coordinator see her but it is Friday afternoon. I am not sure if that will be an option tomorrow.

## 2018-09-10 ENCOUNTER — Inpatient Hospital Stay (HOSPITAL_COMMUNITY): Payer: Medicaid Other

## 2018-09-10 DIAGNOSIS — K5901 Slow transit constipation: Secondary | ICD-10-CM

## 2018-09-10 LAB — URINALYSIS, ROUTINE W REFLEX MICROSCOPIC
Bacteria, UA: NONE SEEN
Bilirubin Urine: NEGATIVE
Glucose, UA: NEGATIVE mg/dL
Hgb urine dipstick: NEGATIVE
Ketones, ur: NEGATIVE mg/dL
Nitrite: NEGATIVE
Protein, ur: 100 mg/dL — AB
Specific Gravity, Urine: 1.01 (ref 1.005–1.030)
pH: 5 (ref 5.0–8.0)

## 2018-09-10 LAB — BASIC METABOLIC PANEL
Anion gap: 8 (ref 5–15)
Anion gap: 9 (ref 5–15)
BUN: 49 mg/dL — ABNORMAL HIGH (ref 8–23)
BUN: 51 mg/dL — ABNORMAL HIGH (ref 8–23)
CALCIUM: 8.6 mg/dL — AB (ref 8.9–10.3)
CO2: 22 mmol/L (ref 22–32)
CO2: 19 mmol/L — ABNORMAL LOW (ref 22–32)
CREATININE: 2.33 mg/dL — AB (ref 0.44–1.00)
Calcium: 8.9 mg/dL (ref 8.9–10.3)
Chloride: 102 mmol/L (ref 98–111)
Chloride: 101 mmol/L (ref 98–111)
Creatinine, Ser: 2.63 mg/dL — ABNORMAL HIGH (ref 0.44–1.00)
GFR calc Af Amer: 21 mL/min — ABNORMAL LOW (ref >60)
GFR calc non Af Amer: 18 mL/min — ABNORMAL LOW (ref >60)
GFR, EST AFRICAN AMERICAN: 24 mL/min — AB (ref >60)
GFR, EST NON AFRICAN AMERICAN: 21 mL/min — AB (ref >60)
Glucose, Bld: 123 mg/dL — ABNORMAL HIGH (ref 70–99)
Glucose, Bld: 159 mg/dL — ABNORMAL HIGH (ref 70–99)
Potassium: 4.9 mmol/L (ref 3.5–5.1)
Potassium: 5.3 mmol/L — ABNORMAL HIGH (ref 3.5–5.1)
Sodium: 132 mmol/L — ABNORMAL LOW (ref 135–145)
Sodium: 129 mmol/L — ABNORMAL LOW (ref 135–145)

## 2018-09-10 LAB — BRAIN NATRIURETIC PEPTIDE: B Natriuretic Peptide: 732.9 pg/mL — ABNORMAL HIGH (ref 0.0–100.0)

## 2018-09-10 LAB — PROTEIN / CREATININE RATIO, URINE
Creatinine, Urine: 136.05 mg/dL
Protein Creatinine Ratio: 0.71 mg/mg{Cre} — ABNORMAL HIGH (ref 0.00–0.15)
Total Protein, Urine: 96 mg/dL

## 2018-09-10 LAB — GLUCOSE, CAPILLARY
GLUCOSE-CAPILLARY: 236 mg/dL — AB (ref 70–99)
Glucose-Capillary: 131 mg/dL — ABNORMAL HIGH (ref 70–99)
Glucose-Capillary: 156 mg/dL — ABNORMAL HIGH (ref 70–99)
Glucose-Capillary: 145 mg/dL — ABNORMAL HIGH (ref 70–99)

## 2018-09-10 LAB — NA AND K (SODIUM & POTASSIUM), RAND UR
Potassium Urine: 46 mmol/L
Sodium, Ur: <10 mmol/L

## 2018-09-10 LAB — PHOSPHORUS: Phosphorus: 4.8 mg/dL — ABNORMAL HIGH (ref 2.5–4.6)

## 2018-09-10 MED ORDER — SENNA 8.6 MG PO TABS: 1.0000 | ORAL_TABLET | Freq: Two times a day (BID) | ORAL | Status: DC | PRN

## 2018-09-10 MED ORDER — METOCLOPRAMIDE HCL 5 MG/ML IJ SOLN
5.0000 mg | Freq: Three times a day (TID) | INTRAMUSCULAR | Status: DC
  Administered 2018-09-10: 5 mg via INTRAVENOUS
  Filled 2018-09-10: qty 2

## 2018-09-10 MED ORDER — ENOXAPARIN SODIUM 30 MG/0.3ML ~~LOC~~ SOLN
30.0000 mg | Freq: Every day | SUBCUTANEOUS | Status: DC
  Administered 2018-09-10 – 2018-09-21 (×12): 30 mg via SUBCUTANEOUS
  Filled 2018-09-10 (×12): qty 0.3

## 2018-09-10 MED ORDER — DOCUSATE SODIUM 100 MG PO CAPS
100.0000 mg | ORAL_CAPSULE | Freq: Two times a day (BID) | ORAL | Status: DC
  Administered 2018-09-10 – 2018-09-18 (×17): 100 mg via ORAL
  Filled 2018-09-10 (×17): qty 1

## 2018-09-10 MED ORDER — BISACODYL 10 MG RE SUPP
10.0000 mg | Freq: Every day | RECTAL | Status: DC | PRN
  Administered 2018-09-10: 10 mg via RECTAL
  Filled 2018-09-10: qty 1

## 2018-09-10 MED ORDER — SENNA 8.6 MG PO TABS
2.0000 | ORAL_TABLET | Freq: Two times a day (BID) | ORAL | Status: DC
  Administered 2018-09-10 – 2018-09-11 (×3): 17.2 mg via ORAL
  Filled 2018-09-10 (×3): qty 2

## 2018-09-10 MED ORDER — LACTULOSE 10 GM/15ML PO SOLN
10.0000 g | Freq: Every day | ORAL | Status: DC | PRN
  Administered 2018-09-10: 10 g via ORAL
  Filled 2018-09-10: qty 15

## 2018-09-10 NOTE — Progress Notes (Addendum)
[address]       [place] [address]             [phone]      12 Days Post-Op Procedure(s) (LRB): CORONARY ARTERY BYPASS GRAFTING (CABG) x three, using left internal mammary artery and right leg greater saphenous vein harvested endoscopically (N/A) TRANSESOPHAGEAL ECHOCARDIOGRAM (TEE) (N/A) Subjective: She shares that her stomach feels bloated and that she cannot pass any gas. She had a bowel movement this morning but it was small. She had an episode while walking yesterday where she became sweaty and had to sit down. She also has occasional dizziness with changing positions.    Objective: Vital signs in last 24 hours: Temp:  [97.9 F (36.6 C)-98.6 F (37 C)] 98.6 F (37 C) (11/16 0448) Pulse Rate:  [68-80] 75 (11/16 0807) Cardiac Rhythm: Normal sinus rhythm;Bundle branch block (11/16 0702) Resp:  [18-36] 36 (11/16 0448) BP: (114-145)/(43-99) 137/47 (11/16 0807) SpO2:  [93 %-98 %] 98 % (11/16 0833)     Intake/Output from previous day: 11/15 0701 - 11/16 0700 In: 500 [P.O.:500] Out: -  Intake/Output this shift: No intake/output data recorded.  General appearance: alert, cooperative and no distress Heart: regular rate and rhythm, S1, S2 normal, no murmur, click, rub or gallop Lungs: clear to auscultation bilaterally Abdomen: soft, non-tender; bowel sounds normal; no masses,  no organomegaly Extremities: extremities normal, atraumatic, no cyanosis or edema Wound: clean and dry  Lab Results: No results for input(s): WBC, HGB, HCT, PLT in the last 72 hours. BMET:  Recent Labs    09/09/18 0332 09/10/18 0327  NA 133* 132*  K 5.5* 4.9  CL 102 102  CO2 22 22  GLUCOSE 133* 123*  BUN 47* 49*  CREATININE 2.63* 2.33*  CALCIUM 8.7* 8.6*    PT/INR: No results for input(s): LABPROT, INR in the last 72 hours. ABG    Component Value Date/Time   PHART 7.342 (L) 08/30/2018 0133   HCO3 23.3 08/30/2018 0133   TCO2 25 08/30/2018 1523   ACIDBASEDEF  2.0 08/30/2018 0133   O2SAT 75.1 08/31/2018 0400   CBG (last 3)  Recent Labs    09/09/18 1629 09/09/18 2110 09/10/18 0644  GLUCAP 91 178* 131*    Assessment/Plan: S/P Procedure(s) (LRB): CORONARY ARTERY BYPASS GRAFTING (CABG) x three, using left internal mammary artery and right leg greater saphenous vein harvested endoscopically (N/A) TRANSESOPHAGEAL ECHOCARDIOGRAM (TEE) (N/A)  1. CV -Ventricular Bigeminy. BP well controlled.  On Coreg 25 mg bid, Amlodipine 10 mg daily, Plavix 75 mg daily, and hydralazine 25mg  TID. Will not start ACE or ARB as CKD (elevated creatinine).  2. Pulmonary - On room air. Encourage incentive spirometer 3.Renal-CKD stage II-III. Creatininedecreased to  2.33. Admission creatinine 1.71. Will re-order daily weights since not done. Potassium decreased to 4.9 4. Acute blood loss anemia - Last H and H 11.1 and 35.2 5. DM-CBGs 159/141/122. On Insulin  Pre op HGA1C 9.3. She will need close medical follow up after discharge. 6. Volume overload-continue with Lasix. Weight has remained the same the last few days. 7. Stomach bloating/constipation-on simethicone, miralax, will add reglan. +BM this morning  8. Dizziness-She is only drinking 2 bottles of water per day and has been on lasix for several days. I suggesting increasing water intake to at least 6 bottles of water per day. She is now off diuretics but I suspect she is dry. BP has been okay.   Plan: The son is requesting a  medical doctor to come see his mother in regards to her stomach bloating/constipation. I have ordered some Reglan IV for her-her discomfort could be due to some gastroparesis. She did have a bowel movement this morning and she has +BS but they are concerned since it does not seem to be getting better. She has no pain in her stomach but says it feels "tight". She feels like she cannot pass gas. She has been on miralax and simethicone for a few days. I also consulted the diabetic educator and  made the nurse aware to review her home insulin regimen and make recommendations on dosing.    LOS: 19 days    Elgie Collard 09/10/2018   Will give dose of lactulose , hold on reglan Son insists sh be seen by medical md- will ask for consult I have seen and examined Jadamarie Muller and agree with the above assessment  and plan.  Grace Isaac MD [facility] [phone] Office [phone] 09/10/2018 12:35 PM

## 2018-09-10 NOTE — Progress Notes (Signed)
CARDIAC REHAB PHASE I   PRE:  Rate/Rhythm: 76 bigeminy  BP:  Supine:   Sitting: 141/50  Standing:    SaO2: 96% RA  MODE:  Ambulation: 470 ft   POST:  Rate/Rhythm: 87 SR  BP:  Supine:   Sitting: 148/56  Standing:    SaO2: 97% RA  [id number] Patient initially declined to walk because of feeling weak, but decided to walk with son pushing rollator behind. Pt tolerated fairly well with multiple standing rest breaks taken, no cardiac c/o, pt only complaint of tightness in belly from bloating/constipation. To bed after ambulation, VSS.  Sol Passer, MS, ACSM CEP

## 2018-09-10 NOTE — Progress Notes (Signed)
Patient was offered and declined tap water enema.  Po meds given to address constipation.  Will  Continue to evaluate.

## 2018-09-10 NOTE — Consult Note (Signed)
Medical Consultation   Joy Boyd  VHQ:[id number]  DOB: 13-Nov-1950  DOA: 08/20/2018  PCP: Patient, No Pcp Per   Requesting physician: Servando Snare  Reason for consultation: Bloating, constipation   History of Present Illness: Joy Boyd is an 67 y.o. female with a h/o CAD, chronic systolic CHF, DM2, UXL2-4, HLD who underwent CABG x 3 using LIMA and R leg GSV on 08/29/2018 on ASA, plavix, and statin. She has been recovering fairly well from surgery with the exception of mild volume overload for which she has been diuresed with IV lasix and acute on chronic kidney disease which is also improving. TRH was asked to see the patient for ongoing abdominal discomfort, bloating and constipation since surgery.  She did have at least a couple of bowel movements shortly after her surgery but it sounds like she went several days without having a bowel movement until November 13 when she had one bowel movement.  Since then she has had none except for a small one this morning which was not felt to be sufficient given how long she is been constipated and given her continued abdominal discomfort.  She has been started on MiraLAX, simethicone and Zofran.  She denies nausea and vomiting.  She is passing gas but feels like there is a lot of gas that is not passing that is causing her to feel bloated.  Is otherwise she is getting close to discharge and she was doing well with walking and PT but now is not able to walk much due to her abdominal discomfort.  Also, noted that creatinine prior to surgery was 1.1- 1.3 although had been up to 1.65 on 11/3 prior to trending down. BUN has risen and fallen with creatinine indicating her renal insufficiency is likely 2/2 volume depletion and is fluctuating with changes in lasix dosing. The day after surgery her creatinine rose quickly to 2, remained stable in that range until 11/13 when it rose to 2.24, and continued to rise to a peak of 2.63 (yesterday) and is down  to 2.33 today. BUN today is 49. She was given IV lasix on 11/8 and 11/10 and on oral lasix 40 mg from 11/11 until yesterday.   Blood sugars have mostly been adequately controlled while in-house with an occasional outlier to the low 200s.    Review of Systems:  ROS As per HPI otherwise 10 point review of systems negative.    Past Medical History: Past Medical History:  Diagnosis Date  . Acute on chronic combined systolic and diastolic CHF (congestive heart failure) ([place]) 08/23/2018  . Coronary artery disease   . Diabetes mellitus type 2 in obese ([place]) 08/23/2018  . Diabetes mellitus without complication ([place])   . Hypertension   . Pure hypercholesterolemia 08/23/2018  . PVC (premature ventricular contraction) 08/23/2018    Past Surgical History: Past Surgical History:  Procedure Laterality Date  . CORONARY ARTERY BYPASS GRAFT N/A 08/29/2018   Procedure: CORONARY ARTERY BYPASS GRAFTING (CABG) x three, using left internal mammary artery and right leg greater saphenous vein harvested endoscopically;  Surgeon: Gaye Pollack, MD;  Location: [facility];  Service: Open Heart Surgery;  Laterality: N/A;  . LEFT HEART CATH AND CORONARY ANGIOGRAPHY N/A 08/23/2018   Procedure: LEFT HEART CATH AND CORONARY ANGIOGRAPHY;  Surgeon: Leonie Man, MD;  Location: [facility];  Service: Cardiovascular;  Laterality: N/A;  . RIGHT HEART CATH N/A 08/25/2018  Procedure: RIGHT HEART CATH;  Surgeon: Belva Crome, MD;  Location: [facility];  Service: Cardiovascular;  Laterality: N/A;  . TEE WITHOUT CARDIOVERSION N/A 08/29/2018   Procedure: TRANSESOPHAGEAL ECHOCARDIOGRAM (TEE);  Surgeon: Gaye Pollack, MD;  Location: [facility];  Service: Open Heart Surgery;  Laterality: N/A;     Allergies:  No Known Allergies   Social History:  reports that she has never smoked. She has never used smokeless tobacco. She reports that she does not drink alcohol or use drugs.   Family History: Family  History  Problem Relation Age of Onset  . CAD Father   . Hypertension Father       Physical Exam: Vitals:   09/09/18 2122 09/10/18 0448 09/10/18 0807 09/10/18 0833  BP:  (!) 140/50 (!) 137/47   Pulse:  75 75   Resp:  (!) 36    Temp:  98.6 F (37 C)    TempSrc:  Oral    SpO2: 96% 93%  98%  Weight:      Height:        Constitutional: Alert and awake, oriented x3, not in any acute distress.  Lying supine in bed. Eyes: PERLA, EOMI, irises appear normal, anicteric sclera,  ENMT: external ears and nose appear normal, normal hearing, Lips appear normal, oropharynx mucosa, tongue, posterior pharynx appear normal  Neck: neck appears normal, no masses, normal ROM, no thyromegaly, no JVD  CVS: Sternotomy incision appears to be healing well.  S1-S2 clear, no murmur rubs or gallops, no LE edema, normal pedal pulses  Respiratory:  clear to auscultation bilaterally, no wheezing, rales or rhonchi. Respiratory effort normal. No accessory muscle use.  Abdomen: Moderately distended, not tense.  Hypoactive bowel sounds with occasional mid to high pitched tinkling sounds.  Tympanic to percussion over the umbilicus.  She is nontender to palpation.  No organomegaly or masses palpated.  No rebound or guarding., soft nontender, nondistended, normal bowel sounds, no hepatosplenomegaly, no hernias  Musculoskeletal: : no cyanosis, clubbing or edema noted bilaterally Neuro: Cranial nerves II-XII intact, strength, sensation, reflexes Psych: judgement and insight appear normal, stable mood and affect, mental status Skin: no rashes or lesions or ulcers, no induration or nodules    Data reviewed:  I have personally reviewed the recent labs and imaging studies  TEE 08/29/2018: Dilated left atrium, mild mitral regurg, mildly dilated right ventricle, mildly reduced systolic function. TTE 08/21/2018: Severe global left ventricular dysfunction with an EF of 15 to 20%.  Diffuse hypokinesis.  Frequent PVCs.  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  Grade 2 diastolic dysfunction  Chest x-ray 1114: Small bilateral pleural effusions otherwise unremarkable  Pertinent Labs:  Creatinine trend as above Sodium 132 potassium 4.9 glucose 123 BUN 49 creatinine 2.33 Last hemoglobin was 11.1 up from 7.0 after 2 units prbc   Inpatient Medications:   Scheduled Meds: . amLODipine  10 mg Oral Daily  . aspirin EC  81 mg Oral Daily  . atorvastatin  80 mg Oral Daily  . budesonide  0.25 mg Inhalation BID  . carvedilol  25 mg Oral BID WC  . clopidogrel  75 mg Oral Daily  . enoxaparin (LOVENOX) injection  40 mg Subcutaneous QHS  . ferrous [id number] C-folic acid  1 capsule Oral BID WC  . hydrALAZINE  25 mg Oral Q8H  . insulin aspart  0-24 Units Subcutaneous TID AC & HS  . insulin aspart  3 Units Subcutaneous TID WC  . insulin detemir  10 Units Subcutaneous  BID  . metoCLOPramide (REGLAN) injection  5 mg Intravenous Q8H  . pantoprazole  40 mg Oral QAC breakfast  . polyethylene glycol  17 g Oral Daily  . sodium chloride flush  3 mL Intravenous Q12H   Continuous Infusions: . sodium chloride       Radiological Exams on Admission: No results found.  Impression/Recommendations Principal Problem:   Acute on chronic combined systolic and diastolic CHF (congestive heart failure) (HCC) Active Problems:   Coronary artery disease involving native coronary artery of native heart with angina pectoris (HCC)   Unstable angina (HCC)   Abnormal nuclear stress test   PVC (premature ventricular contraction)   Diabetes mellitus type 2 in obese ([place])   Pure hypercholesterolemia   CAD S/P percutaneous coronary angioplasty   Dilated cardiomyopathy (HCC)   CAD (coronary artery disease)   Constipation by delayed colonic transit  #1.  Constipation, bloating, abdominal discomfort likely caused by pain medication and postsurgical status, decreased mobility.  I have spoken at length with the patient and her son.  I am  somewhat concerned for ileus or partial small bowel obstruction given her symptoms and exam.  We will get a abdominal x-ray to assess stool burden and to evaluate for possible ileus or small bowel obstruction.  In the meantime start scheduled Senokot 2 tabs twice daily.  With narcotic induced constipation, it is important to treat with a stimulant laxative such as Senokot in addition to a stool softener to aid in resumption of peristalsis.  Continue scheduled Colace.  Continue daily MiraLAX.  I have also recommended either a tap water enema or bisacodyl suppository as many times, stimulation from below can quickly result in a large bowel movement and help reverse the cycle that is started.  She does not wish to try an enema yet but is considering a Dulcolax suppository.  Her decreased appetite is likely due to her constipation and this is also causing her to feel weak.  The constipation itself can cause pressure on the diaphragm which can cause her to be more short of breath with activity.  I have encouraged her to sit upright as much as possible during the day and to get up and walk around as much as possible as well.  Minimize opiate medications as much as possible realizing that she is still not that far out of surgery and may still require them.  #2 Acute on chronic kidney disease: This appears to be fluctuating based on her volume status.  She could have had some ATN after her surgery if she was hypotensive.  Her volume status is a little difficult to ascertain based on her physical exam.  We will check a BNP.  We will hold off on diuresis for now and encourage her to drink to thirst for now and follow her creatinine closely.  Will send urine studies and renal ultrasound and would consider nephrology consult in the next day or 2 if renal function does not improve or if it worsens. Will check phosphorus.  Monitor creatinine and potassium closely.  Avoid nephrotoxins.  Strict ins and outs and daily weights.   Have reiterated this to nurse.  Thank you for this consultation.  Our [facility] hospitalist team will follow the patient with you.   Time Spent: 60 min  Janora Norlander M.D. [facility] 09/10/2018, 12:14 PM

## 2018-09-11 LAB — RENAL FUNCTION PANEL
ALBUMIN: 2.7 g/dL — AB (ref 3.5–5.0)
Anion gap: 10 (ref 5–15)
BUN: 49 mg/dL — ABNORMAL HIGH (ref 8–23)
CALCIUM: 8.8 mg/dL — AB (ref 8.9–10.3)
CO2: 19 mmol/L — ABNORMAL LOW (ref 22–32)
CREATININE: 2.27 mg/dL — AB (ref 0.44–1.00)
Chloride: 100 mmol/L (ref 98–111)
GFR, EST AFRICAN AMERICAN: 25 mL/min — AB (ref >60)
GFR, EST NON AFRICAN AMERICAN: 21 mL/min — AB (ref >60)
Glucose, Bld: 131 mg/dL — ABNORMAL HIGH (ref 70–99)
Phosphorus: 4.2 mg/dL (ref 2.5–4.6)
Potassium: 4.7 mmol/L (ref 3.5–5.1)
Sodium: 129 mmol/L — ABNORMAL LOW (ref 135–145)

## 2018-09-11 LAB — CBC WITH DIFFERENTIAL/PLATELET
ABS IMMATURE GRANULOCYTES: 0.05 10*3/uL (ref 0.00–0.07)
BASOS ABS: 0 10*3/uL (ref 0.0–0.1)
BASOS PCT: 0 %
EOS PCT: 2 %
Eosinophils Absolute: 0.2 10*3/uL (ref 0.0–0.5)
HCT: 27.9 % — ABNORMAL LOW (ref 36.0–46.0)
HEMOGLOBIN: 8.8 g/dL — AB (ref 12.0–15.0)
Immature Granulocytes: 1 %
LYMPHS PCT: 11 %
Lymphs Abs: 1.1 10*3/uL (ref 0.7–4.0)
MCH: 27.2 pg (ref 26.0–34.0)
MCHC: 31.5 g/dL (ref 30.0–36.0)
MCV: 86.1 fL (ref 80.0–100.0)
MONO ABS: 0.8 10*3/uL (ref 0.1–1.0)
Monocytes Relative: 8 %
NEUTROS ABS: 7.9 10*3/uL — AB (ref 1.7–7.7)
NRBC: 0 % (ref 0.0–0.2)
Neutrophils Relative %: 78 %
PLATELETS: 308 10*3/uL (ref 150–400)
RBC: 3.24 MIL/uL — AB (ref 3.87–5.11)
RDW: 14.1 % (ref 11.5–15.5)
WBC: 10 10*3/uL (ref 4.0–10.5)

## 2018-09-11 LAB — OSMOLALITY: Osmolality: 294 mOsm/kg (ref 275–295)

## 2018-09-11 LAB — GLUCOSE, CAPILLARY
GLUCOSE-CAPILLARY: 93 mg/dL (ref 70–99)
GLUCOSE-CAPILLARY: 207 mg/dL — AB (ref 70–99)
GLUCOSE-CAPILLARY: 121 mg/dL — AB (ref 70–99)
GLUCOSE-CAPILLARY: 191 mg/dL — AB (ref 70–99)

## 2018-09-11 MED ORDER — SENNA 8.6 MG PO TABS
1.0000 | ORAL_TABLET | Freq: Two times a day (BID) | ORAL | Status: DC
  Administered 2018-09-11 – 2018-09-18 (×12): 8.6 mg via ORAL
  Filled 2018-09-11 (×14): qty 1

## 2018-09-11 NOTE — Progress Notes (Signed)
Ambulated with patient 240 ft. in the hallway on room air with standby assist. Ambulation well-tolerated with two brief stops. Janeth Rase, SN

## 2018-09-11 NOTE — Progress Notes (Addendum)
[address]       [place] [address]             [phone]      13 Days Post-Op Procedure(s) (LRB): CORONARY ARTERY BYPASS GRAFTING (CABG) x three, using left internal mammary artery and right leg greater saphenous vein harvested endoscopically (N/A) TRANSESOPHAGEAL ECHOCARDIOGRAM (TEE) (N/A) Subjective: She is feeling a little better today, no pain.   Objective: Vital signs in last 24 hours: Temp:  [98 F (36.7 C)-98.5 F (36.9 C)] 98.4 F (36.9 C) (11/17 0758) Pulse Rate:  [37-86] 37 (11/17 0758) Cardiac Rhythm: Normal sinus rhythm;Bundle branch block (11/16 2100) Resp:  [13-27] 18 (11/17 0758) BP: (121-139)/(47-59) 139/47 (11/17 0758) SpO2:  [94 %-98 %] 95 % (11/17 0758) Weight:  [72.6 kg] 72.6 kg (11/17 0442)     Intake/Output from previous day: 11/16 0701 - 11/17 0700 In: 420 [P.O.:420] Out: 650 [Urine:650] Intake/Output this shift: Total I/O In: 3 [I.V.:3] Out: -   General appearance: alert, cooperative and no distress Heart: sinus brady Lungs: clear to auscultation bilaterally Abdomen: soft, non-tender; bowel sounds normal; no masses,  no organomegaly Extremities: R > L 1+ nonpitting edema Wound: clean and dry  Lab Results: Recent Labs    09/11/18 0825  WBC 10.0  HGB 8.8*  HCT 27.9*  PLT 308   BMET:  Recent Labs    09/10/18 1641 09/11/18 0826  NA 129* 129*  K 5.3* 4.7  CL 101 100  CO2 19* 19*  GLUCOSE 159* 131*  BUN 51* 49*  CREATININE 2.63* 2.27*  CALCIUM 8.9 8.8*    PT/INR: No results for input(s): LABPROT, INR in the last 72 hours. ABG    Component Value Date/Time   PHART 7.342 (L) 08/30/2018 0133   HCO3 23.3 08/30/2018 0133   TCO2 25 08/30/2018 1523   ACIDBASEDEF 2.0 08/30/2018 0133   O2SAT 75.1 08/31/2018 0400   CBG (last 3)  Recent Labs    09/10/18 1705 09/10/18 2159 09/11/18 0628  GLUCAP 145* 236* 93    Assessment/Plan: S/P Procedure(s) (LRB): CORONARY ARTERY BYPASS GRAFTING (CABG) x  three, using left internal mammary artery and right leg greater saphenous vein harvested endoscopically (N/A) TRANSESOPHAGEAL ECHOCARDIOGRAM (TEE) (N/A)  1. CV -VentricularBigeminy. Pulse rate is 40bpm on exam today. Will hold coreg and add parameters. BP well controlled.  On Coreg 25 mg bid-holding, Amlodipine 10 mg daily, Plavix 75 mg daily, and hydralazine 25mg  TID. Will not start ACE or ARB as CKD (elevated creatinine).  2. Pulmonary - On room air. Encourage incentive spirometer 3.Renal-CKD stage II-III. Creatininedecreased to  2.27. Admission creatinine 1.3. Will re-order daily weights since not done. Potassium decreased to 4.7 4. Acute blood loss anemia - Last H and H 11.1 and 35.2 5. DM-CBGs 159/141/122. On Insulin  Pre op HGA1C 9.3. She will need close medical follow up after discharge. 6. Stomach bloating/constipation-on simethicone, miralax, dulcolax suppository, senokot, and lactulose.  3 total BMs between last night and this morning. She feels better. KUB showed no constipation.   Plan: Medicine consult yesterday. Creatinine decreased this morning. Abdominal issues seem resolved. She is encouraged to drink more water today (yest per son she old had two cups total). Encouraged ambulation in the halls. Encouraged incentive spirometer. Holding coreg for bradycardia.    LOS: 20 days    Elgie Collard 09/11/2018  Cr slowly improving  Plan to go home with son I have seen and examined Avina Kon and agree  with the above assessment  and plan.  Grace Isaac MD Beeper [phone] Office [phone] 09/11/2018 12:22 PM

## 2018-09-11 NOTE — Progress Notes (Signed)
[facility] Consultation Progress Note  Patient: Joy Boyd ENI:[id number]   PCP: Patient, No Pcp Per DOB: 1951-03-29   DOA: 08/20/2018   DOS: 09/11/2018   Date of Service: the patient was seen and examined on 09/11/2018 Primary service: Gaye Pollack, MD    Brief hospital course: Joy Boyd is an 67 y.o. female with a h/o CAD, chronic systolic CHF, DM2, IRW4-3, HLD who underwent CABG x 3 using LIMA and R leg GSV on 08/29/2018 on ASA, plavix, and statin. She has been recovering fairly well from surgery with the exception of mild volume overload for which she has been diuresed with IV lasix and acute on chronic kidney disease which is also improving. TRH was asked to see the patient for ongoing abdominal discomfort, bloating and constipation since surgery.  She did have at least a couple of bowel movements shortly after her surgery but it sounds like she went several days without having a bowel movement until November 13 when she had one bowel movement.  Since then she has had none except for a small one this morning which was not felt to be sufficient given how long she is been constipated and given her continued abdominal discomfort.  She has been started on MiraLAX, simethicone and Zofran.  She denies nausea and vomiting.  She is passing gas but feels like there is a lot of gas that is not passing that is causing her to feel bloated.  Is otherwise she is getting close to discharge and she was doing well with walking and PT but now is not able to walk much due to her abdominal discomfort. Currently further plan is monitor progress.  Subjective: Constipation resolved.  Feeling better.  No nausea no vomiting.  Assessment and Plan: #1.  Constipation, bloating, abdominal discomfort likely caused by pain medication and postsurgical status, decreased mobility.  Continue bowel regimen.  #2 Acute on chronic kidney disease: This appears to be fluctuating based on her volume status.  She could  have had some ATN after her surgery if she was hypotensive.  Her volume status is a little difficult to ascertain based on her physical exam.   We will hold off on diuresis for now and encourage her to drink to thirst for now and follow her creatinine closely.   Unremarkable renal ultrasound and would consider nephrology consult in the next day renal function does not improve or if it worsens.   Diet: Cardiac diet DVT Prophylaxis: subcutaneous Heparin  Family Communication: family was present at bedside, at the time of interview. The pt provided permission to discuss medical plan with the family. Opportunity was given to ask question and all questions were answered satisfactorily.    Disposition: We will continue to follow the patient.   Recommendation on discharge: To be determined  Other Consultants: none Procedures: CABG Antibiotics: Anti-infectives (From admission, onward)   Start     Dose/Rate Route Frequency Ordered Stop   08/29/18 1930  vancomycin (VANCOCIN) IVPB 1000 mg/200 mL premix     1,000 mg 200 mL/hr over 60 Minutes Intravenous  Once 08/29/18 1327 08/29/18 2003   08/29/18 1600  cefUROXime (ZINACEF) 1.5 g in sodium chloride 0.9 % 100 mL IVPB     1.5 g 200 mL/hr over 30 Minutes Intravenous Every 12 hours 08/29/18 1327 08/31/18 0444   08/29/18 0400  vancomycin (VANCOCIN) 1,250 mg in sodium chloride 0.9 % 250 mL IVPB     1,250 mg 166.7 mL/hr over 90 Minutes Intravenous To Surgery  08/26/18 1031 08/29/18 0715   08/29/18 0400  cefUROXime (ZINACEF) 1.5 g in sodium chloride 0.9 % 100 mL IVPB     1.5 g 200 mL/hr over 30 Minutes Intravenous To Surgery 08/26/18 1031 08/29/18 0800   08/29/18 0400  cefUROXime (ZINACEF) 750 mg in sodium chloride 0.9 % 100 mL IVPB  Status:  Discontinued     750 mg 200 mL/hr over 30 Minutes Intravenous To Surgery 08/26/18 1031 08/29/18 1327   08/24/18 1730  cefTRIAXone (ROCEPHIN) 1 g in sodium chloride 0.9 % 100 mL IVPB  Status:  Discontinued     1  g 200 mL/hr over 30 Minutes Intravenous Every 24 hours 08/24/18 1717 08/27/18 1231   08/22/18 1000  azithromycin (ZITHROMAX) tablet 250 mg     250 mg Oral Daily 08/21/18 0405 08/25/18 0935   08/21/18 0500  azithromycin (ZITHROMAX) tablet 500 mg     500 mg Oral Daily 08/21/18 0405 08/21/18 0606      Objective: Physical Exam: Vitals:   09/11/18 0955 09/11/18 1205 09/11/18 1744 09/11/18 1746  BP:  (!) 152/50  (!) 163/50  Pulse:  (!) 40 (!) 37 (!) 38  Resp:  18  20  Temp:  98 F (36.7 C)    TempSrc:  Oral    SpO2: 96% 94%  95%  Weight:      Height:        Intake/Output Summary (Last 24 hours) at 09/11/2018 1918 Last data filed at 09/11/2018 1700 Gross per 24 hour  Intake 483 ml  Output 450 ml  Net 33 ml   Filed Weights   09/08/18 0437 09/09/18 0421 09/11/18 0442  Weight: 72.1 kg 72.3 kg 72.6 kg   General: Alert, Awake and Oriented to Time, Place and Person. Appear in mild distress, affect appropriate Eyes: PERRL, Conjunctiva normal ENT: Oral Mucosa clear moist. Neck: no JVD, no Abnormal Mass Or lumps Cardiovascular: S1 and S2 Present, no Murmur, Peripheral Pulses Present Respiratory: normal respiratory effort, Bilateral Air entry equal and Decreased, no use of accessory muscle, Clear to Auscultation, no Crackles, no wheezes Abdomen: Bowel Sound present, Soft and no tenderness, no hernia Skin: no redness, no Rash, no induration Extremities: no Pedal edema, no calf tenderness Neurologic: Grossly no focal neuro deficit. Bilaterally Equal motor strength   Data Reviewed: CBC: Recent Labs  Lab 09/11/18 0825  WBC 10.0  NEUTROABS 7.9*  HGB 8.8*  HCT 27.9*  MCV 86.1  PLT 361   Basic Metabolic Panel: Recent Labs  Lab 09/08/18 0409 09/09/18 0332 09/10/18 0327 09/10/18 1641 09/11/18 0826  NA 131* 133* 132* 129* 129*  K 5.8* 5.5* 4.9 5.3* 4.7  CL 101 102 102 101 100  CO2 22 22 22  19* 19*  GLUCOSE 153* 133* 123* 159* 131*  BUN 48* 47* 49* 51* 49*  CREATININE  2.25* 2.63* 2.33* 2.63* 2.27*  CALCIUM 8.7* 8.7* 8.6* 8.9 8.8*  PHOS  --   --   --  4.8* 4.2   Liver Function Tests: Recent Labs  Lab 09/11/18 0826  ALBUMIN 2.7*   No results for input(s): LIPASE, AMYLASE in the last 168 hours. No results for input(s): AMMONIA in the last 168 hours. Cardiac Enzymes: No results for input(s): CKTOTAL, CKMB, CKMBINDEX, TROPONINI in the last 168 hours. BNP (last 3 results) Recent Labs    08/21/18 0358 09/10/18 1451  BNP 532.8* 732.9*   CBG: Recent Labs  Lab 09/10/18 1705 09/10/18 2159 09/11/18 0628 09/11/18 1203 09/11/18 1615  GLUCAP 145* 236*  93 207* 121*   No results found for this or any previous visit (from the past 240 hour(s)).  Studies: No results found.   Scheduled Meds: . amLODipine  10 mg Oral Daily  . aspirin EC  81 mg Oral Daily  . atorvastatin  80 mg Oral Daily  . budesonide  0.25 mg Inhalation BID  . carvedilol  25 mg Oral BID WC  . clopidogrel  75 mg Oral Daily  . docusate sodium  100 mg Oral BID  . enoxaparin (LOVENOX) injection  30 mg Subcutaneous QHS  . ferrous [id number] C-folic acid  1 capsule Oral BID WC  . hydrALAZINE  25 mg Oral Q8H  . insulin aspart  0-24 Units Subcutaneous TID AC & HS  . insulin aspart  3 Units Subcutaneous TID WC  . insulin detemir  10 Units Subcutaneous BID  . pantoprazole  40 mg Oral QAC breakfast  . polyethylene glycol  17 g Oral Daily  . senna  1 tablet Oral BID  . sodium chloride flush  3 mL Intravenous Q12H   Continuous Infusions: . sodium chloride     PRN Meds: sodium chloride, acetaminophen, bisacodyl, lactulose, ondansetron **OR** ondansetron (ZOFRAN) IV, simethicone, sodium chloride flush, traMADol  Time spent: 35 minutes  Author: Berle Mull, MD [facility] Pager: [phone] 09/11/2018 7:18 PM  If 7PM-7AM, please contact night-coverage at [email or web address], password [facility]

## 2018-09-11 NOTE — Progress Notes (Signed)
Ambulated with patient 240 ft. in hallway on room air with standby assist. Ambulation well-tolerated with only one stop.   Janeth Rase, SN

## 2018-09-12 DIAGNOSIS — R1013 Epigastric pain: Secondary | ICD-10-CM

## 2018-09-12 LAB — COMPREHENSIVE METABOLIC PANEL
ALBUMIN: 2.7 g/dL — AB (ref 3.5–5.0)
ALT: 28 U/L (ref 0–44)
AST: 22 U/L (ref 15–41)
Alkaline Phosphatase: 137 U/L — ABNORMAL HIGH (ref 38–126)
Anion gap: 7 (ref 5–15)
BUN: 48 mg/dL — AB (ref 8–23)
CHLORIDE: 100 mmol/L (ref 98–111)
CO2: 20 mmol/L — AB (ref 22–32)
Calcium: 8.4 mg/dL — ABNORMAL LOW (ref 8.9–10.3)
Creatinine, Ser: 2.24 mg/dL — ABNORMAL HIGH (ref 0.44–1.00)
GFR calc Af Amer: 25 mL/min — ABNORMAL LOW (ref >60)
GFR, EST NON AFRICAN AMERICAN: 22 mL/min — AB (ref >60)
Glucose, Bld: 148 mg/dL — ABNORMAL HIGH (ref 70–99)
POTASSIUM: 4.4 mmol/L (ref 3.5–5.1)
SODIUM: 127 mmol/L — AB (ref 135–145)
Total Bilirubin: 0.6 mg/dL (ref 0.3–1.2)
Total Protein: 5.6 g/dL — ABNORMAL LOW (ref 6.5–8.1)

## 2018-09-12 LAB — GLUCOSE, CAPILLARY
GLUCOSE-CAPILLARY: 185 mg/dL — AB (ref 70–99)
GLUCOSE-CAPILLARY: 143 mg/dL — AB (ref 70–99)
Glucose-Capillary: 131 mg/dL — ABNORMAL HIGH (ref 70–99)
Glucose-Capillary: 239 mg/dL — ABNORMAL HIGH (ref 70–99)

## 2018-09-12 LAB — CBC WITH DIFFERENTIAL/PLATELET
ABS IMMATURE GRANULOCYTES: 0.06 10*3/uL (ref 0.00–0.07)
BASOS ABS: 0 10*3/uL (ref 0.0–0.1)
BASOS PCT: 0 %
EOS ABS: 0.3 10*3/uL (ref 0.0–0.5)
Eosinophils Relative: 3 %
HCT: 26.9 % — ABNORMAL LOW (ref 36.0–46.0)
Hemoglobin: 8.4 g/dL — ABNORMAL LOW (ref 12.0–15.0)
Immature Granulocytes: 1 %
Lymphocytes Relative: 10 %
Lymphs Abs: 1 10*3/uL (ref 0.7–4.0)
MCH: 26.4 pg (ref 26.0–34.0)
MCHC: 31.2 g/dL (ref 30.0–36.0)
MCV: 84.6 fL (ref 80.0–100.0)
Monocytes Absolute: 0.8 10*3/uL (ref 0.1–1.0)
Monocytes Relative: 8 %
NEUTROS ABS: 7.8 10*3/uL — AB (ref 1.7–7.7)
NEUTROS PCT: 78 %
NRBC: 0 % (ref 0.0–0.2)
PLATELETS: 345 10*3/uL (ref 150–400)
RBC: 3.18 MIL/uL — AB (ref 3.87–5.11)
RDW: 14 % (ref 11.5–15.5)
WBC: 10 10*3/uL (ref 4.0–10.5)

## 2018-09-12 LAB — MAGNESIUM: Magnesium: 1.9 mg/dL (ref 1.7–2.4)

## 2018-09-12 MED ORDER — SODIUM CHLORIDE 0.9 % IV SOLN: 250.0000 mL | INTRAVENOUS | Status: AC | PRN

## 2018-09-12 MED ORDER — SODIUM CHLORIDE 0.9 % IV SOLN: 250.0000 mL | INTRAVENOUS | Status: DC | PRN

## 2018-09-12 NOTE — Progress Notes (Addendum)
      [address]       [place], [address]             [phone]        14 Days Post-Op Procedure(s) (LRB): CORONARY ARTERY BYPASS GRAFTING (CABG) x three, using left internal mammary artery and right leg greater saphenous vein harvested endoscopically (N/A) TRANSESOPHAGEAL ECHOCARDIOGRAM (TEE) (N/A)  Subjective: Patient's children who speak English at bedside. Mom has no complaints this am.  Objective: Vital signs in last 24 hours: Temp:  [98 F (36.7 C)-98.3 F (36.8 C)] 98.3 F (36.8 C) (11/17 1922) Pulse Rate:  [37-77] 40 (11/18 0348) Cardiac Rhythm: Normal sinus rhythm;Bundle branch block (11/17 2100) Resp:  [18-25] 25 (11/18 0348) BP: (142-166)/(43-84) 158/60 (11/18 0609) SpO2:  [93 %-98 %] 94 % (11/18 0348) Weight:  [74 kg] 74 kg (11/18 0348)  Pre op weight 69.9 kg Current Weight  09/12/18 74 kg      Intake/Output from previous day: 11/17 0701 - 11/18 0700 In: 783 [P.O.:780; I.V.:3] Out: 1150 [Urine:1150]   Physical Exam:  Cardiovascular: RRR Pulmonary: Clear to auscultation bilaterally Abdomen: Soft, non tender, bowel sounds present. Extremities: Mild bilateral lower extremity edema. Wounds: Clean and dry.  No erythema or signs of infection.  Lab Results: CBC: Recent Labs    09/11/18 0825 09/12/18 0334  WBC 10.0 10.0  HGB 8.8* 8.4*  HCT 27.9* 26.9*  PLT 308 345   BMET:  Recent Labs    09/11/18 0826 09/12/18 0334  NA 129* 127*  K 4.7 4.4  CL 100 100  CO2 19* 20*  GLUCOSE 131* 148*  BUN 49* 48*  CREATININE 2.27* 2.24*  CALCIUM 8.8* 8.4*    PT/INR:  Lab Results  Component Value Date   INR 1.38 08/29/2018   INR 0.99 08/29/2018   ABG:  INR: Will add last result for INR, ABG once components are confirmed Will add last 4 CBG results once components are confirmed  Assessment/Plan:  1. CV - Bigem[place]. Hypertensive. On Coreg 25 mg bid, Amlodipine 10 mg daily, Hydralazine 25 mg tid, and Plavix 75 mg daily. Will  not start ACE or ARB as CKD (elevated creatinine).  2.  Pulmonary - On room air. Encourage incentive spirometer 3. Renal-CKD stage II-III. Creatinine slightly decreased from 2.27 to 2.24. Admission creatinine 1.71. 4.  Acute blood loss anemia - Last H and H 8.4 and 26.9 5. DM-CBGs 121/191/131. On Insulin  Pre op HGA1C 9.3. She will need close medical follow up after discharge. 6. Volume overload-diuresis has been on hold secondary to elevated creatinine. Will discuss with Dr. Cyndia Bent.  Donielle M ZimmermanPA-C 09/12/2018,8:02 AM [phone]   Chart reviewed, patient examined, agree with above. She is ambulating without difficulty.  Had bowel movement today per husband but continues to complain of abdomen feeling full. I suspect this is related to general total body volume overload. Weight is 9 lbs over preop although probably intravascularly dry with low urinary sodium and fraction excretion sodium. Internal medicine ordered some IVF for her. Her albumin is low which will also lead to edema.  Acute kidney injury with rise in creatinine from baseline to peak around 2.4. Creatinine stable at 2.24 after holding diuretic but higher than preop baseline of 1.7.  She is developing hyponatremia and will need to follow that closely. May need a short course of tolvaptan.  Will get echo to check LV and RV function postop, rule out significant pericardial effusion.

## 2018-09-12 NOTE — Progress Notes (Signed)
Inpatient Diabetes Program Recommendations  AACE/ADA: New Consensus Statement on Inpatient Glycemic Control (2019)  Target Ranges:  Prepandial:   less than 140 mg/dL      Peak postprandial:   less than 180 mg/dL (1-2 hours)      Critically ill patients:  140 - 180 mg/dL  Results for DELISA, FINCK (MRN [id number]) as of 09/12/2018 08:48  Ref. Range 09/11/2018 06:28 09/11/2018 12:03 09/11/2018 16:15 09/11/2018 21:05 09/12/2018 06:37  Glucose-Capillary Latest Ref Range: 70 - 99 mg/dL 93 207 (H) 121 (H) 191 (H) 131 (H)    Review of Glycemic Control  Diabetes history: DM2 Outpatient Diabetes medications:  70/30 35 units QAM, 70/30 25 units QPM Current orders for Inpatient glycemic control: Levemir 10 units BID, Novolog 0-24 units AC&HS, Novolog 3 units TID with meals for meal coverage  Inpatient Diabetes Program Recommendations:  Outpatient DM regimen:  Recommend patient discharge back on 70/30 insulin (dose adjusted from prior dosages) and on Regular insulin per correction scale. Based on current insulin regimen, recommend 70/30 15 units BID (with breakfast and supper). This dose will provide a total of 21 units for basal and 9 units for meal coverage per day.   If patient is also discharged on Regular insulin correction scale would recommend using Regular 0-15 units TID with meals and 0-5 units QHS as follows: Regular 0-15 units TID with meals  CBG less than 120 = 0 121-150 2 units 151-200  3 units 201-250 5 units 251-300  8 units 301-350 11 units >351  15 units  Regular 0-5 units QHS  CBG less than 200 =0 201-250 2 units 251-300 3 units 301-350 4 units 351-400 5 units  NOTE: Patient was already using 70/30 insulin as an outpatient prior to admission and may have some at home already. IF not, NOVOLIN 70/30 and NOVOLIN Regular can be purchased at [facility] for $25 per vial.   Thanks, Barnie Alderman, RN, MSN, CDE Diabetes Coordinator Inpatient Diabetes Program [phone] (Team Pager  from 8am to 5pm)

## 2018-09-12 NOTE — Progress Notes (Signed)
CARDIAC REHAB PHASE I   PRE:  Rate/Rhythm: 84 SR    BP: sitting 139/53    SaO2: 93 RA  MODE:  Ambulation: 470 ft   POST:  Rate/Rhythm: 90 SR    BP: sitting 162/58     SaO2: 95 RA  Pt is moving better but still c/o her upper abdomen feeling tight. Sts it is worse with walking. She also gets SOB walking, esp toward the end. Rest x2 toward end of walk to catch her breath. To recliner. Encouraged sitting up, x3 more walks today and more eating/protein. Daughter in law and husband present for translation.  [place], ACSM 09/12/2018 2:31 PM

## 2018-09-12 NOTE — Progress Notes (Signed)
[facility] Consultation Progress Note  Patient: Joy Boyd EXN:[id number]   PCP: Patient, No Pcp Per DOB: 08-19-1951   DOA: 08/20/2018   DOS: 09/12/2018   Date of Service: the patient was seen and examined on 09/12/2018 Primary service: Gaye Pollack, MD    Brief hospital course: Joy Boyd is an 67 y.o. 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 She has been recovering fairly well from surgery with the exception of mild volume overload for which she has been diuresed with IV lasix and acute on chronic kidney disease which is also improving. TRH was asked to see the patient for ongoing abdominal discomfort, bloating and constipation since surgery.  She did have at least a couple of bowel movements shortly after her surgery but it sounds like she went several days without having a bowel movement until November 13 when she had one bowel movement.  Since 11/13 , patient found to be constipated.   She has been started on MiraLAX, simethicone and Zofran.       Subjective: Constipation resolved.   Patient ambulated with physical therapy this morning, stated the chair up until 9 AM and then went to bed ,  Assessment and Plan: #1.  Constipation, bloating, abdominal discomfort likely caused by pain medication and postsurgical status, decreased mobility.  Continue Colace, MiraLAX, Senokot,  #2 Acute on chronic kidney disease:baseline creatinine around 1.5-1.7.  urine sodium less than 10. FeNa less than 1%. Patient is likely prerenal and over diuresed. Will start patient on gentle hydration for 12 hours and monitor.no hydronephrosis on renal ultrasound We will hold off on diuresis , encourage her to drink to thirst for now and follow her creatinine closely.   consider nephrology consult in the next day renal function does not improve or if it worsens despite IV hydration.   Could also  consider repeating echo to evaluate EF after CABG   Diet: Cardiac diet DVT Prophylaxis: subcutaneous Heparin  Family Communication: family , son  was present at bedside, at the time of interview. The pt provided permission to discuss medical plan with the family. Opportunity was given to ask question and all questions were answered satisfactorily.    Disposition: We will continue to follow the patient.   Recommendation on discharge: To be determined  Other Consultants: none Procedures: CABG Antibiotics: Anti-infectives (From admission, onward)   Start     Dose/Rate Route Frequency Ordered Stop   08/29/18 1930  vancomycin (VANCOCIN) IVPB 1000 mg/200 mL premix     1,000 mg 200 mL/hr over 60 Minutes Intravenous  Once 08/29/18 1327 08/29/18 2003   08/29/18 1600  cefUROXime (ZINACEF) 1.5 g in sodium chloride 0.9 % 100 mL IVPB     1.5 g 200 mL/hr over 30 Minutes Intravenous Every 12 hours 08/29/18 1327 08/31/18 0444   08/29/18 0400  vancomycin (VANCOCIN) 1,250 mg in sodium chloride 0.9 % 250 mL IVPB     1,250 mg 166.7 mL/hr over 90 Minutes Intravenous To Surgery 08/26/18 1031 08/29/18 0715   08/29/18 0400  cefUROXime (ZINACEF) 1.5 g in sodium chloride 0.9 % 100 mL IVPB     1.5 g 200 mL/hr over 30 Minutes Intravenous To Surgery 08/26/18 1031 08/29/18 0800   08/29/18 0400  cefUROXime (ZINACEF) 750 mg in sodium chloride 0.9 % 100 mL IVPB  Status:  Discontinued     750 mg 200  mL/hr over 30 Minutes Intravenous To Surgery 08/26/18 1031 08/29/18 1327   08/24/18 1730  cefTRIAXone (ROCEPHIN) 1 g in sodium chloride 0.9 % 100 mL IVPB  Status:  Discontinued     1 g 200 mL/hr over 30 Minutes Intravenous Every 24 hours 08/24/18 1717 08/27/18 1231   08/22/18 1000  azithromycin (ZITHROMAX) tablet 250 mg     250 mg Oral Daily 08/21/18 0405 08/25/18 0935   08/21/18 0500  azithromycin (ZITHROMAX) tablet 500 mg     500 mg Oral Daily 08/21/18 0405 08/21/18 0606      Objective: Physical  Exam: Vitals:   09/11/18 2109 09/12/18 0015 09/12/18 0348 09/12/18 0609  BP: (!) 142/84  (!) 157/43 (!) 158/60  Pulse:   (!) 40   Resp:   (!) 25   Temp:      TempSrc:      SpO2:   94%   Weight:  74 kg 74 kg   Height:        Intake/Output Summary (Last 24 hours) at 09/12/2018 0824 Last data filed at 09/12/2018 9357 Gross per 24 hour  Intake 783 ml  Output 1150 ml  Net -367 ml   Filed Weights   09/11/18 0442 09/12/18 0015 09/12/18 0348  Weight: 72.6 kg 74 kg 74 kg   General: Alert, Awake and Oriented to Time, Place and Person. Appear in mild distress, affect appropriate Eyes: PERRL, Conjunctiva normal ENT: Oral Mucosa clear moist. Neck: no JVD, no Abnormal Mass Or lumps Cardiovascular: S1 and S2 Present, no Murmur, Peripheral Pulses Present Respiratory: normal respiratory effort, Bilateral Air entry equal and Decreased, no use of accessory muscle, Clear to Auscultation, no Crackles, no wheezes Abdomen: Bowel Sound present, Soft and no tenderness, no hernia Skin: no redness, no Rash, no induration Extremities: no Pedal edema, no calf tenderness Neurologic: Grossly no focal neuro deficit. Bilaterally Equal motor strength   Data Reviewed: CBC: Recent Labs  Lab 09/11/18 0825 09/12/18 0334  WBC 10.0 10.0  NEUTROABS 7.9* 7.8*  HGB 8.8* 8.4*  HCT 27.9* 26.9*  MCV 86.1 84.6  PLT 308 017   Basic Metabolic Panel: Recent Labs  Lab 09/09/18 0332 09/10/18 0327 09/10/18 1641 09/11/18 0826 09/12/18 0334  NA 133* 132* 129* 129* 127*  K 5.5* 4.9 5.3* 4.7 4.4  CL 102 102 101 100 100  CO2 22 22 19* 19* 20*  GLUCOSE 133* 123* 159* 131* 148*  BUN 47* 49* 51* 49* 48*  CREATININE 2.63* 2.33* 2.63* 2.27* 2.24*  CALCIUM 8.7* 8.6* 8.9 8.8* 8.4*  MG  --   --   --   --  1.9  PHOS  --   --  4.8* 4.2  --    Liver Function Tests: Recent Labs  Lab 09/11/18 0826 09/12/18 0334  AST  --  22  ALT  --  28  ALKPHOS  --  137*  BILITOT  --  0.6  PROT  --  5.6*  ALBUMIN 2.7* 2.7*    No results for input(s): LIPASE, AMYLASE in the last 168 hours. No results for input(s): AMMONIA in the last 168 hours. Cardiac Enzymes: No results for input(s): CKTOTAL, CKMB, CKMBINDEX, TROPONINI in the last 168 hours. BNP (last 3 results) Recent Labs    08/21/18 0358 09/10/18 1451  BNP 532.8* 732.9*   CBG: Recent Labs  Lab 09/11/18 0628 09/11/18 1203 09/11/18 1615 09/11/18 2105 09/12/18 0637  GLUCAP 93 207* 121* 191* 131*   No results found for this or any  previous visit (from the past 240 hour(s)).  Studies: No results found.   Scheduled Meds: . amLODipine  10 mg Oral Daily  . aspirin EC  81 mg Oral Daily  . atorvastatin  80 mg Oral Daily  . budesonide  0.25 mg Inhalation BID  . carvedilol  25 mg Oral BID WC  . clopidogrel  75 mg Oral Daily  . docusate sodium  100 mg Oral BID  . enoxaparin (LOVENOX) injection  30 mg Subcutaneous QHS  . ferrous [id number] C-folic acid  1 capsule Oral BID WC  . hydrALAZINE  25 mg Oral Q8H  . insulin aspart  0-24 Units Subcutaneous TID AC & HS  . insulin aspart  3 Units Subcutaneous TID WC  . insulin detemir  10 Units Subcutaneous BID  . pantoprazole  40 mg Oral QAC breakfast  . polyethylene glycol  17 g Oral Daily  . senna  1 tablet Oral BID  . sodium chloride flush  3 mL Intravenous Q12H   Continuous Infusions: . sodium chloride     PRN Meds: sodium chloride, acetaminophen, bisacodyl, lactulose, ondansetron **OR** ondansetron (ZOFRAN) IV, simethicone, sodium chloride flush, traMADol  Time spent: 35 minutes  Author:   [facility]     If 7PM-7AM, please contact night-coverage at [email or web address], password [facility]

## 2018-09-13 ENCOUNTER — Inpatient Hospital Stay (HOSPITAL_COMMUNITY): Payer: Medicaid Other

## 2018-09-13 DIAGNOSIS — I340 Nonrheumatic mitral (valve) insufficiency: Secondary | ICD-10-CM

## 2018-09-13 LAB — COMPREHENSIVE METABOLIC PANEL
ALBUMIN: 3 g/dL — AB (ref 3.5–5.0)
ALK PHOS: 127 U/L — AB (ref 38–126)
ALT: 26 U/L (ref 0–44)
ANION GAP: 11 (ref 5–15)
AST: 21 U/L (ref 15–41)
BUN: 40 mg/dL — ABNORMAL HIGH (ref 8–23)
CALCIUM: 8.9 mg/dL (ref 8.9–10.3)
CO2: 20 mmol/L — ABNORMAL LOW (ref 22–32)
Chloride: 97 mmol/L — ABNORMAL LOW (ref 98–111)
Creatinine, Ser: 2.01 mg/dL — ABNORMAL HIGH (ref 0.44–1.00)
GFR calc Af Amer: 29 mL/min — ABNORMAL LOW (ref >60)
GFR calc non Af Amer: 25 mL/min — ABNORMAL LOW (ref >60)
GLUCOSE: 98 mg/dL (ref 70–99)
POTASSIUM: 4.2 mmol/L (ref 3.5–5.1)
SODIUM: 128 mmol/L — AB (ref 135–145)
Total Bilirubin: 0.6 mg/dL (ref 0.3–1.2)
Total Protein: 6.1 g/dL — ABNORMAL LOW (ref 6.5–8.1)

## 2018-09-13 LAB — CBC
HCT: 28 % — ABNORMAL LOW (ref 36.0–46.0)
Hemoglobin: 9.2 g/dL — ABNORMAL LOW (ref 12.0–15.0)
MCH: 27.9 pg (ref 26.0–34.0)
MCHC: 32.9 g/dL (ref 30.0–36.0)
MCV: 84.8 fL (ref 80.0–100.0)
NRBC: 0 % (ref 0.0–0.2)
Platelets: 340 10*3/uL (ref 150–400)
RBC: 3.3 MIL/uL — AB (ref 3.87–5.11)
RDW: 13.9 % (ref 11.5–15.5)
WBC: 9.5 10*3/uL (ref 4.0–10.5)

## 2018-09-13 LAB — GLUCOSE, CAPILLARY
GLUCOSE-CAPILLARY: 147 mg/dL — AB (ref 70–99)
GLUCOSE-CAPILLARY: 167 mg/dL — AB (ref 70–99)
Glucose-Capillary: 98 mg/dL (ref 70–99)
Glucose-Capillary: 159 mg/dL — ABNORMAL HIGH (ref 70–99)
Glucose-Capillary: 56 mg/dL — ABNORMAL LOW (ref 70–99)

## 2018-09-13 LAB — ECHOCARDIOGRAM COMPLETE
Height: 61 in
WEIGHTICAEL: 2585.55 [oz_av]

## 2018-09-13 MED ORDER — HYDROCODONE-ACETAMINOPHEN 5-325 MG PO TABS: 2.0000 | ORAL_TABLET | Freq: Once | ORAL | Status: DC

## 2018-09-13 MED ORDER — INSULIN ASPART PROT & ASPART (70-30 MIX) 100 UNIT/ML ~~LOC~~ SUSP
15.0000 [IU] | Freq: Two times a day (BID) | SUBCUTANEOUS | Status: DC
  Administered 2018-09-13: 15 [IU] via SUBCUTANEOUS
  Filled 2018-09-13: qty 10

## 2018-09-13 MED ORDER — INSULIN ASPART PROT & ASPART (70-30 MIX) 100 UNIT/ML ~~LOC~~ SUSP
15.0000 [IU] | Freq: Two times a day (BID) | SUBCUTANEOUS | Status: DC
  Filled 2018-09-13: qty 10

## 2018-09-13 NOTE — Progress Notes (Addendum)
Hypoglycemic Event  CBG: 56 @2133   Treatment: 15 GM carbohydrate snack (1.5 cup of cranberry juice )  Symptoms: None   Follow-up CBG: Time:2229 CBG Result:167  Possible Reasons for Event: Inadequate meal intake, pt received insulin before meal and didn't eat anything   Comments/MD notified: on call [facility] MD text paged @2237     [place]

## 2018-09-13 NOTE — Progress Notes (Signed)
CARDIAC REHAB PHASE I   PRE:  Rate/Rhythm: 71 SR    BP: sitting 144/60    SaO2: 93 RA  MODE:  Ambulation: 470 ft   POST:  Rate/Rhythm: 88 SR    BP: sitting 179/55     SaO2: 93 RA  Pt tolerated well today. Min assist with x2 rest stops for SOB. Did not c/o her abdomen being tight today. Encouraged more walking, eating (not eating well-1/2 egg for breakfast), IS, recliner. Husband and pt in agreement. Asked RN to check in to a nutritional drink. 750 mL on IS. Left in recliner. [phone]   [place], ACSM 09/13/2018 11:19 AM

## 2018-09-13 NOTE — Progress Notes (Signed)
Inpatient Diabetes Program Recommendations  AACE/ADA: New Consensus Statement on Inpatient Glycemic Control (2015)  Target Ranges:  Prepandial:   less than 140 mg/dL      Peak postprandial:   less than 180 mg/dL (1-2 hours)      Critically ill patients:  140 - 180 mg/dL   Lab Results  Component Value Date   GLUCAP 159 (H) 09/13/2018   HGBA1C 9.3 (H) 08/22/2018    DM2    Current meds:  70/30 15 units 2 times a day with meals to start with dinner. Novolog correction (0-24 units) ac & hs Novolog 3 units tid ac   MD - please discontinue order for Novolog meal coverage 3 units tid as 70/30 has "built in" meal coverage.   Thank you.  -- Will follow during hospitalization.--  Jonna Clark RN, MSN Diabetes Coordinator Inpatient Glycemic Control Team Team Pager: [phone] (8am-5pm)

## 2018-09-13 NOTE — Progress Notes (Signed)
  Echocardiogram 2D Echocardiogram has been performed.  Joy Boyd 09/13/2018, 11:10 AM

## 2018-09-13 NOTE — Progress Notes (Signed)
[facility] Consultation Progress Note  Patient: Joy Boyd OIZ:[id number]   PCP: Patient, No Pcp Per DOB: [birth year]   DOA: 08/20/2018   DOS: 09/13/2018   Date of Service: the patient was seen and examined on 09/13/2018 Primary service: Joy Pollack, MD    Brief hospital course: Joy Boyd is an 67 y.o. 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 She has been recovering fairly well from surgery with the exception of mild volume overload for which she has been diuresed with IV lasix and acute on chronic kidney disease which is also improving. TRH was asked to see the patient for ongoing abdominal discomfort, bloating and constipation since surgery.  She did have at least a couple of bowel movements shortly after her surgery but it sounds like she went several days without having a bowel movement until November 13 when she had one bowel movement.  Since 11/13 , patient found to be constipated.   She has been started on MiraLAX, simethicone and Zofran.       Subjective: she seems to be improving on a daily basis. She has no swelling in her left leg. She has some swelling in the right leg where she had vein harvesting. Otherwise she has no shortness of breath currently on room air    Assessment and Plan: #1.  Constipation, bloating, abdominal discomfort likely caused by pain medication and postsurgical status, decreased mobility. resolved Continue Colace, MiraLAX, Senokot,  #2 Acute on chronic kidney disease:baseline creatinine around 1.5-1.7.  urine sodium less than 10. FeNa less than 1%. Patient is likely prerenal and over diuresed.  Received gentle hydration for 12 hours and creatinine is trending down .no hydronephrosis on renal ultrasound We will hold off on diuresis , encourage her to drink to thirst for now and follow her creatinine closely.   no further IV fluids NA  improving, 2.01 today repeat 2-D echo pending  # insulin-dependent diabetes Followed by diabetes coordinator who recommended initiating 70/30 , 15 units twice a plus  sliding scale   Diet: Cardiac diet DVT Prophylaxis: subcutaneous Heparin  Family Communication: family , son  was present at bedside, at the time of interview. The pt provided permission to discuss medical plan with the family. Opportunity was given to ask question and all questions were answered satisfactorily.    Disposition: We will continue to follow the patient.   Recommendation on discharge: To be determined  Other Consultants: none Procedures: CABG Antibiotics: Anti-infectives (From admission, onward)   Start     Dose/Rate Route Frequency Ordered Stop   08/29/18 1930  vancomycin (VANCOCIN) IVPB 1000 mg/200 mL premix     1,000 mg 200 mL/hr over 60 Minutes Intravenous  Once 08/29/18 1327 08/29/18 2003   08/29/18 1600  cefUROXime (ZINACEF) 1.5 g in sodium chloride 0.9 % 100 mL IVPB     1.5 g 200 mL/hr over 30 Minutes Intravenous Every 12 hours 08/29/18 1327 08/31/18 0444   08/29/18 0400  vancomycin (VANCOCIN) 1,250 mg in sodium chloride 0.9 % 250 mL IVPB     1,250 mg 166.7 mL/hr over 90 Minutes Intravenous To Surgery 08/26/18 1031 08/29/18 0715   08/29/18 0400  cefUROXime (ZINACEF) 1.5 g in sodium chloride 0.9 % 100 mL IVPB     1.5 g 200 mL/hr over 30 Minutes Intravenous To Surgery 08/26/18 1031 08/29/18 0800   08/29/18 0400  cefUROXime (ZINACEF) 750 mg in sodium chloride 0.9 % 100 mL IVPB  Status:  Discontinued     750 mg 200 mL/hr over 30 Minutes Intravenous To Surgery 08/26/18 1031 08/29/18 1327   08/24/18 1730  cefTRIAXone (ROCEPHIN) 1 g in sodium chloride 0.9 % 100 mL IVPB  Status:  Discontinued     1 g 200 mL/hr over 30 Minutes Intravenous Every 24 hours 08/24/18 1717 08/27/18 1231   08/22/18 1000  azithromycin (ZITHROMAX) tablet 250 mg     250 mg Oral Daily 08/21/18 0405 08/25/18 0935   08/21/18  0500  azithromycin (ZITHROMAX) tablet 500 mg     500 mg Oral Daily 08/21/18 0405 08/21/18 0606      Objective: Physical Exam: Vitals:   09/12/18 1108 09/12/18 2011 09/12/18 2018 09/13/18 0313  BP: (!) 141/56 (!) 125/51  (!) 143/59  Pulse: 78 72  76  Resp: 17 17  20   Temp: 98.7 F (37.1 C) 98.4 F (36.9 C)  97.7 F (36.5 C)  TempSrc: Oral Oral  Oral  SpO2: 95% 95% 95% 96%  Weight:    73.3 kg  Height:        Intake/Output Summary (Last 24 hours) at 09/13/2018 0851 Last data filed at 09/12/2018 0901 Gross per 24 hour  Intake 125 ml  Output -  Net 125 ml   Filed Weights   09/12/18 0015 09/12/18 0348 09/13/18 0313  Weight: 74 kg 74 kg 73.3 kg   General: Alert, Awake and Oriented to Time, Place and Person. Appear in mild distress, affect appropriate Eyes: PERRL, Conjunctiva normal ENT: Oral Mucosa clear moist. Neck: no JVD, no Abnormal Mass Or lumps Cardiovascular: S1 and S2 Present, no Murmur, Peripheral Pulses Present Respiratory: normal respiratory effort, Bilateral Air entry equal and Decreased, no use of accessory muscle, Clear to Auscultation, no Crackles, no wheezes Abdomen: Bowel Sound present, Soft and no tenderness, no hernia Skin: no redness, no Rash, no induration Extremities: no Pedal edema, no calf tenderness Neurologic: Grossly no focal neuro deficit. Bilaterally Equal motor strength   Data Reviewed: CBC: Recent Labs  Lab 09/11/18 0825 09/12/18 0334 09/13/18 0319  WBC 10.0 10.0 9.5  NEUTROABS 7.9* 7.8*  --   HGB 8.8* 8.4* 9.2*  HCT 27.9* 26.9* 28.0*  MCV 86.1 84.6 84.8  PLT 308 345 426   Basic Metabolic Panel: Recent Labs  Lab 09/10/18 0327 09/10/18 1641 09/11/18 0826 09/12/18 0334 09/13/18 0319  NA 132* 129* 129* 127* 128*  K 4.9 5.3* 4.7 4.4 4.2  CL 102 101 100 100 97*  CO2 22 19* 19* 20* 20*  GLUCOSE 123* 159* 131* 148* 98  BUN 49* 51* 49* 48* 40*  CREATININE 2.33* 2.63* 2.27* 2.24* 2.01*  CALCIUM 8.6* 8.9 8.8* 8.4* 8.9  MG  --    --   --  1.9  --   PHOS  --  4.8* 4.2  --   --    Liver Function Tests: Recent Labs  Lab 09/11/18 0826 09/12/18 0334 09/13/18 0319  AST  --  22 21  ALT  --  28 26  ALKPHOS  --  137* 127*  BILITOT  --  0.6 0.6  PROT  --  5.6* 6.1*  ALBUMIN 2.7* 2.7* 3.0*   No results for input(s): LIPASE, AMYLASE in the last 168 hours. No results for input(s): AMMONIA in the last 168 hours. Cardiac Enzymes: No results for input(s): CKTOTAL, CKMB, CKMBINDEX, TROPONINI in the last 168 hours. BNP (last 3 results)  Recent Labs    08/21/18 0358 09/10/18 1451  BNP 532.8* 732.9*   CBG: Recent Labs  Lab 09/12/18 0637 09/12/18 1106 09/12/18 1657 09/12/18 2204 09/13/18 0641  GLUCAP 131* 239* 185* 143* 98   No results found for this or any previous visit (from the past 240 hour(s)).  Studies: No results found.   Scheduled Meds: . amLODipine  10 mg Oral Daily  . aspirin EC  81 mg Oral Daily  . atorvastatin  80 mg Oral Daily  . budesonide  0.25 mg Inhalation BID  . carvedilol  25 mg Oral BID WC  . clopidogrel  75 mg Oral Daily  . docusate sodium  100 mg Oral BID  . enoxaparin (LOVENOX) injection  30 mg Subcutaneous QHS  . ferrous [id number] C-folic acid  1 capsule Oral BID WC  . hydrALAZINE  25 mg Oral Q8H  . insulin aspart  0-24 Units Subcutaneous TID AC & HS  . insulin aspart  3 Units Subcutaneous TID WC  . insulin aspart protamine- aspart  15 Units Subcutaneous BID WC  . pantoprazole  40 mg Oral QAC breakfast  . polyethylene glycol  17 g Oral Daily  . senna  1 tablet Oral BID  . sodium chloride flush  3 mL Intravenous Q12H   Continuous Infusions:  PRN Meds: acetaminophen, bisacodyl, lactulose, ondansetron **OR** ondansetron (ZOFRAN) IV, simethicone, sodium chloride flush, traMADol  Time spent: 35 minutes  Author:   [facility]     If 7PM-7AM, please contact night-coverage at [email or web address], password [facility]

## 2018-09-13 NOTE — Progress Notes (Signed)
      [address]       [place] [address]             [phone]      15 Days Post-Op Procedure(s) (LRB): CORONARY ARTERY BYPASS GRAFTING (CABG) x three, using left internal mammary artery and right leg greater saphenous vein harvested endoscopically (N/A) TRANSESOPHAGEAL ECHOCARDIOGRAM (TEE) (N/A) Subjective: Shares that she had some fullness in her stomach yesterday and because of this she didn't eat at all the rest of the day.   Objective: Vital signs in last 24 hours: Temp:  [97.7 F (36.5 C)-98.7 F (37.1 C)] 97.7 F (36.5 C) (11/19 0313) Pulse Rate:  [72-80] 76 (11/19 0313) Cardiac Rhythm: Normal sinus rhythm;Bundle branch block (11/18 1900) Resp:  [17-20] 20 (11/19 0313) BP: (125-152)/(51-73) 143/59 (11/19 0313) SpO2:  [95 %-96 %] 96 % (11/19 0313) Weight:  [73.3 kg] 73.3 kg (11/19 0313)     Intake/Output from previous day: 11/18 0701 - 11/19 0700 In: 125 [P.O.:125] Out: -  Intake/Output this shift: No intake/output data recorded.  General appearance: alert, cooperative and no distress Heart: regular rate and rhythm, S1, S2 normal, no murmur, click, rub or gallop Lungs: clear to auscultation bilaterally Abdomen: soft, non-tender; bowel sounds normal; no masses,  no organomegaly Extremities: extremities normal, atraumatic, no cyanosis or edema Wound: clean and dry  Lab Results: Recent Labs    09/12/18 0334 09/13/18 0319  WBC 10.0 9.5  HGB 8.4* 9.2*  HCT 26.9* 28.0*  PLT 345 340   BMET:  Recent Labs    09/12/18 0334 09/13/18 0319  NA 127* 128*  K 4.4 4.2  CL 100 97*  CO2 20* 20*  GLUCOSE 148* 98  BUN 48* 40*  CREATININE 2.24* 2.01*  CALCIUM 8.4* 8.9    PT/INR: No results for input(s): LABPROT, INR in the last 72 hours. ABG    Component Value Date/Time   PHART 7.342 (L) 08/30/2018 0133   HCO3 23.3 08/30/2018 0133   TCO2 25 08/30/2018 1523   ACIDBASEDEF 2.0 08/30/2018 0133   O2SAT 75.1 08/31/2018 0400   CBG (last 3)    Recent Labs    09/12/18 1657 09/12/18 2204 09/13/18 0641  GLUCAP 185* 143* 98    Assessment/Plan: S/P Procedure(s) (LRB): CORONARY ARTERY BYPASS GRAFTING (CABG) x three, using left internal mammary artery and right leg greater saphenous vein harvested endoscopically (N/A) TRANSESOPHAGEAL ECHOCARDIOGRAM (TEE) (N/A)  1. CV-await results of echocardiogram. Bigeminy. BP mostly controlled. Continue Coreg 25mg  BID, Amlodipine 10mg  daily, Hydralazine 25mg  TID, and Plavix 75mg  daily. No ACEI or ARB due to creatinine 2. Pulm-On room air with good oxygen saturation 3. Renal-CKD stage II-III, creatinine down to 2.01 with IVF.  4. Acute blood loss anemia-H and H 8.4/26.9 5. DM- CBGs 185, 143, 98 6. Volume overload-remains above her baseline weight. May suggest albumin with lasix for push and pull effect  Plan: Continue to work on renal function. Continue bowel regimen for stomach pain and gas. + BM this morning. Limit narcotics. Encouraged to ambulate in the halls and use her incentive spirometer. Encourage to try and eat three meals a day.       LOS: 22 days    Elgie Collard 09/13/2018

## 2018-09-14 ENCOUNTER — Inpatient Hospital Stay (HOSPITAL_COMMUNITY): Payer: Medicaid Other

## 2018-09-14 DIAGNOSIS — K5901 Slow transit constipation: Secondary | ICD-10-CM

## 2018-09-14 DIAGNOSIS — E871 Hypo-osmolality and hyponatremia: Secondary | ICD-10-CM

## 2018-09-14 DIAGNOSIS — N183 Chronic kidney disease, stage 3 (moderate): Secondary | ICD-10-CM

## 2018-09-14 LAB — BASIC METABOLIC PANEL
Anion gap: 9 (ref 5–15)
BUN: 37 mg/dL — AB (ref 8–23)
CHLORIDE: 97 mmol/L — AB (ref 98–111)
CO2: 19 mmol/L — ABNORMAL LOW (ref 22–32)
Calcium: 8.5 mg/dL — ABNORMAL LOW (ref 8.9–10.3)
Creatinine, Ser: 2.24 mg/dL — ABNORMAL HIGH (ref 0.44–1.00)
GFR calc Af Amer: 25 mL/min — ABNORMAL LOW (ref >60)
GFR calc non Af Amer: 22 mL/min — ABNORMAL LOW (ref >60)
GLUCOSE: 140 mg/dL — AB (ref 70–99)
POTASSIUM: 4.6 mmol/L (ref 3.5–5.1)
Sodium: 125 mmol/L — ABNORMAL LOW (ref 135–145)

## 2018-09-14 LAB — GLUCOSE, CAPILLARY
Glucose-Capillary: 130 mg/dL — ABNORMAL HIGH (ref 70–99)
Glucose-Capillary: 312 mg/dL — ABNORMAL HIGH (ref 70–99)
Glucose-Capillary: 295 mg/dL — ABNORMAL HIGH (ref 70–99)
Glucose-Capillary: 259 mg/dL — ABNORMAL HIGH (ref 70–99)
Glucose-Capillary: 244 mg/dL — ABNORMAL HIGH (ref 70–99)

## 2018-09-14 MED ORDER — SENNA 8.6 MG PO TABS: 1.0000 | ORAL_TABLET | Freq: Two times a day (BID) | ORAL | 0 refills | Status: DC

## 2018-09-14 MED ORDER — INSULIN ASPART PROT & ASPART (70-30 MIX) 100 UNIT/ML ~~LOC~~ SUSP: 15.0000 [IU] | Freq: Two times a day (BID) | SUBCUTANEOUS | 11 refills | Status: DC

## 2018-09-14 MED ORDER — DOCUSATE SODIUM 100 MG PO CAPS: 100.0000 mg | ORAL_CAPSULE | Freq: Two times a day (BID) | ORAL | 0 refills | Status: DC

## 2018-09-14 MED ORDER — INSULIN DETEMIR 100 UNIT/ML ~~LOC~~ SOLN
10.0000 [IU] | Freq: Two times a day (BID) | SUBCUTANEOUS | Status: DC
  Administered 2018-09-14: 10 [IU] via SUBCUTANEOUS
  Filled 2018-09-14 (×2): qty 0.1

## 2018-09-14 MED ORDER — BISACODYL 10 MG RE SUPP: 10.0000 mg | Freq: Every day | RECTAL | 0 refills | Status: DC | PRN

## 2018-09-14 NOTE — Progress Notes (Signed)
I came around 12 and pt wasn't feeling well, CBG >300. I returned just now and pt is sound asleep and her husband sts she walked with him recently. He says he will walk with her later as well. Will f/u tomorrow Yves Dill CES, ACSM 2:52 PM 09/14/2018

## 2018-09-14 NOTE — Progress Notes (Signed)
      [address]       [place] [address]             [phone]      16 Days Post-Op Procedure(s) (LRB): CORONARY ARTERY BYPASS GRAFTING (CABG) x three, using left internal mammary artery and right leg greater saphenous vein harvested endoscopically (N/A) TRANSESOPHAGEAL ECHOCARDIOGRAM (TEE) (N/A) Subjective: Did not sleep at all. Had a bad night per the son. She had a lot of swelling in her abdomen and feels full therefore she isn't eating.   Objective: Vital signs in last 24 hours: Temp:  [98.1 F (36.7 C)-99.4 F (37.4 C)] 98.1 F (36.7 C) (11/20 0546) Pulse Rate:  [71-78] 78 (11/20 0546) Cardiac Rhythm: Normal sinus rhythm (11/20 0700) Resp:  [12-22] 17 (11/20 0546) BP: (125-159)/(60-82) 159/82 (11/20 0546) SpO2:  [95 %-98 %] 98 % (11/20 0546) Weight:  [73.4 kg] 73.4 kg (11/20 0546)     Intake/Output from previous day: 11/19 0701 - 11/20 0700 In: 1040 [P.O.:1040] Out: -  Intake/Output this shift: No intake/output data recorded.  General appearance: alert, cooperative and no distress Heart: regular rate and rhythm, S1, S2 normal, no murmur, click, rub or gallop Lungs: clear to auscultation bilaterally Abdomen: soft, non-tender; bowel sounds normal; no masses,  no organomegaly Extremities: extremities normal, atraumatic, no cyanosis or edema Wound: clean and dry  Lab Results: Recent Labs    09/12/18 0334 09/13/18 0319  WBC 10.0 9.5  HGB 8.4* 9.2*  HCT 26.9* 28.0*  PLT 345 340   BMET:  Recent Labs    09/13/18 0319 09/14/18 0317  NA 128* 125*  K 4.2 4.6  CL 97* 97*  CO2 20* 19*  GLUCOSE 98 140*  BUN 40* 37*  CREATININE 2.01* 2.24*  CALCIUM 8.9 8.5*    PT/INR: No results for input(s): LABPROT, INR in the last 72 hours. ABG    Component Value Date/Time   PHART 7.342 (L) 08/30/2018 0133   HCO3 23.3 08/30/2018 0133   TCO2 25 08/30/2018 1523   ACIDBASEDEF 2.0 08/30/2018 0133   O2SAT 75.1 08/31/2018 0400   CBG (last 3)    Recent Labs    09/13/18 2133 09/13/18 2229 09/14/18 0612  GLUCAP 56* 167* 130*    Assessment/Plan: S/P Procedure(s) (LRB): CORONARY ARTERY BYPASS GRAFTING (CABG) x three, using left internal mammary artery and right leg greater saphenous vein harvested endoscopically (N/A) TRANSESOPHAGEAL ECHOCARDIOGRAM (TEE) (N/A)  1. CV-hypertensive at times. Bigeminy in the 80s.   2. Pulm-Tolerating room air with good saturation 3. Renal-creatinine 2.24 today, electrolytes normal 4. Abdominal fullness-she is having bowel movements but after a few bites of food she feels full of gas and cannot eat anything else. Will order another KUB. Appreciate medicine's help. May need to consult GI. Patient became hypoglycemic due to not eating yesterday 5. DM-recommended to switch back to levemir 10 units BID by diabetes educator. She did have an episode of hypoglycemia due to not eating.   Plan: Abdomen is soft this morning but she states she feels bloating under her diaphragm. Will order KUB. She has some dry heaving this morning per the son. Maybe consider treatment with PPI or H2 blocker? It could be some acid reflux since she is stating its hard to swallow at times.    LOS: 23 days    Elgie Collard 09/14/2018

## 2018-09-14 NOTE — Progress Notes (Signed)
Patient ID: Joy Boyd, female   DOB: [birth year], 67 y.o.   MRN: [id number]  Consultation PROGRESS NOTE    Joy Boyd  NFA:[id number] DOB: [birth year] DOA: 08/20/2018 PCP: Patient, No Pcp Per   Brief Narrative:  67 year old female with history of CAD, chronic systolic CHF, labs mellitus type II, chronic kidney disease stage II-III, hyperlipidemia  underwent CABG on 08/29/2018.  She has been recovering fairly well from surgery with exception of mild volume overload for which she was diuresed with IV Lasix, which is also improving.  TRH was asked to see the patient for ongoing abdominal discomfort, bloating and constipation since surgery.  Patient was found to be constipated since 09/24/2018.  She was started on MiraLAX, simethicone and Zofran.  Assessment & Plan:   Principal Problem:   Acute on chronic combined systolic and diastolic CHF (congestive heart failure) (HCC) Active Problems:   Coronary artery disease involving native coronary artery of native heart with angina pectoris (HCC)   Unstable angina (HCC)   Abnormal nuclear stress test   PVC (premature ventricular contraction)   Diabetes mellitus type 2 in obese ([place])   Pure hypercholesterolemia   CAD S/P percutaneous coronary angioplasty   Dilated cardiomyopathy (HCC)   CAD (coronary artery disease)   Constipation by delayed colonic transit   Constipation, bloating, abdominal discomfort -Improving -Continue Colace, MiraLAX, Senokot -We will add Protonix once a day.  If symptoms do not improve, consider GI evaluation  Acute kidney injury on chronic kidney disease stage II-III -Baseline creatinine is around 1.5-1.7 -Patient received gentle hydration.  Creatinine is 2.24 today.  No hydronephrosis on renal ultrasound -Diuresis on hold.  Off IV fluids -If renal function worsens, consider nephrology evaluation  Hyponatremia -Sodium 125 today.  Monitor.  If sodium level does not improve, consider nephrology evaluation.  Strict  input and output.  Daily weights  Diabetes mellitus type II with hypoglycemia -Discontinue 70/30 insulin.  Will start Levemir 10 units twice a day.  Encourage patient to eat.  Coronary artery disease status post recent CABG -Care as per primary team   Subjective: Patient seen and examined at bedside.  She is sleepy, hardly wakes up on calling her name.  No overnight fever or vomiting reported by nursing staff.  Objective: Vitals:   09/13/18 2128 09/14/18 0546 09/14/18 0943 09/14/18 1241  BP: 131/61 (!) 159/82 (!) 160/61 (!) 140/51  Pulse: 71 78    Resp: 16 17 20    Temp: 98.9 F (37.2 C) 98.1 F (36.7 C)    TempSrc: Oral Oral    SpO2: 95% 98%    Weight:  73.4 kg    Height:        Intake/Output Summary (Last 24 hours) at 09/14/2018 1413 Last data filed at 09/13/2018 2200 Gross per 24 hour  Intake 560 ml  Output -  Net 560 ml   Filed Weights   09/12/18 0348 09/13/18 0313 09/14/18 0546  Weight: 74 kg 73.3 kg 73.4 kg    Examination:  General exam: Sleepy, hardly wakes up on calling her name. Respiratory system: Bilateral decreased breath sounds at bases Cardiovascular system: S1 & S2 heard, Rate controlled   Data Reviewed: I have personally reviewed following labs and imaging studies  CBC: Recent Labs  Lab 09/11/18 0825 09/12/18 0334 09/13/18 0319  WBC 10.0 10.0 9.5  NEUTROABS 7.9* 7.8*  --   HGB 8.8* 8.4* 9.2*  HCT 27.9* 26.9* 28.0*  MCV 86.1 84.6 84.8  PLT 308 345 469   Basic Metabolic  Panel: Recent Labs  Lab 09/10/18 1641 09/11/18 0826 09/12/18 0334 09/13/18 0319 09/14/18 0317  NA 129* 129* 127* 128* 125*  K 5.3* 4.7 4.4 4.2 4.6  CL 101 100 100 97* 97*  CO2 19* 19* 20* 20* 19*  GLUCOSE 159* 131* 148* 98 140*  BUN 51* 49* 48* 40* 37*  CREATININE 2.63* 2.27* 2.24* 2.01* 2.24*  CALCIUM 8.9 8.8* 8.4* 8.9 8.5*  MG  --   --  1.9  --   --   PHOS 4.8* 4.2  --   --   --    GFR: Estimated Creatinine Clearance: 22.9 mL/min (A) (by C-G formula based  on SCr of 2.24 mg/dL (H)). Liver Function Tests: Recent Labs  Lab 09/11/18 0826 09/12/18 0334 09/13/18 0319  AST  --  22 21  ALT  --  28 26  ALKPHOS  --  137* 127*  BILITOT  --  0.6 0.6  PROT  --  5.6* 6.1*  ALBUMIN 2.7* 2.7* 3.0*   No results for input(s): LIPASE, AMYLASE in the last 168 hours. No results for input(s): AMMONIA in the last 168 hours. Coagulation Profile: No results for input(s): INR, PROTIME in the last 168 hours. Cardiac Enzymes: No results for input(s): CKTOTAL, CKMB, CKMBINDEX, TROPONINI in the last 168 hours. BNP (last 3 results) No results for input(s): PROBNP in the last 8760 hours. HbA1C: No results for input(s): HGBA1C in the last 72 hours. CBG: Recent Labs  Lab 09/13/18 1112 09/13/18 1603 09/13/18 2133 09/13/18 2229 09/14/18 0612  GLUCAP 159* 147* 56* 167* 130*   Lipid Profile: No results for input(s): CHOL, HDL, LDLCALC, TRIG, CHOLHDL, LDLDIRECT in the last 72 hours. Thyroid Function Tests: No results for input(s): TSH, T4TOTAL, FREET4, T3FREE, THYROIDAB in the last 72 hours. Anemia Panel: No results for input(s): VITAMINB12, FOLATE, FERRITIN, TIBC, IRON, RETICCTPCT in the last 72 hours. Sepsis Labs: No results for input(s): PROCALCITON, LATICACIDVEN in the last 168 hours.  No results found for this or any previous visit (from the past 240 hour(s)).       Radiology Studies: Dg Abd 1 View  Result Date: 09/14/2018 CLINICAL DATA:  Abdominal pain today. Recent open heart surgery. History of diabetes and hypertension. EXAM: ABDOMEN - 1 VIEW COMPARISON:  Abdominal radiographs 09/10/2018. FINDINGS: The bowel gas pattern is normal. There is no supine evidence of free intraperitoneal air. There are no suspicious abdominal calcifications. There are probable splenic artery calcifications. Left pleural effusion and retrocardiac left basilar airspace disease are similar to recent chest radiographs. No acute osseous findings are evident. IMPRESSION:  No radiographic evidence of active abdominal process. Electronically Signed   By: Richardean Sale M.D.   On: 09/14/2018 13:51        Scheduled Meds: . amLODipine  10 mg Oral Daily  . aspirin EC  81 mg Oral Daily  . atorvastatin  80 mg Oral Daily  . budesonide  0.25 mg Inhalation BID  . carvedilol  25 mg Oral BID WC  . clopidogrel  75 mg Oral Daily  . docusate sodium  100 mg Oral BID  . enoxaparin (LOVENOX) injection  30 mg Subcutaneous QHS  . hydrALAZINE  25 mg Oral Q8H  . HYDROcodone-acetaminophen  2 tablet Oral Once  . insulin aspart  0-24 Units Subcutaneous TID AC & HS  . insulin aspart protamine- aspart  15 Units Subcutaneous BID WC  . pantoprazole  40 mg Oral QAC breakfast  . polyethylene glycol  17 g Oral Daily  .  senna  1 tablet Oral BID  . sodium chloride flush  3 mL Intravenous Q12H   Continuous Infusions:   LOS: 23 days        Aline August, MD [facility] Pager [phone]  If 7PM-7AM, please contact night-coverage [email or web address] Password [email or web address] 09/14/2018, 2:13 PM

## 2018-09-14 NOTE — Progress Notes (Signed)
Inpatient Diabetes Program Recommendations  AACE/ADA: New Consensus Statement on Inpatient Glycemic Control (2015)  Target Ranges:  Prepandial:   less than 140 mg/dL      Peak postprandial:   less than 180 mg/dL (1-2 hours)      Critically ill patients:  140 - 180 mg/dL   Lab Results  Component Value Date   GLUCAP 130 (H) 09/14/2018   HGBA1C 9.3 (H) 08/22/2018      Results for LURLEEN, SOLTERO (MRN [id number]) as of 09/14/2018 08:19  Ref. Range 09/13/2018 16:03 09/13/2018 21:33 09/13/2018 22:29 09/14/2018 06:12  Glucose-Capillary Latest Ref Range: 70 - 99 mg/dL 147 (H) 56 (L) 167 (H) 130 (H)     Current DM meds: Novolog 70/30 15 units two times day with meals                                  Novolog correction scale (0-24 units) tid ac & hs    Note CBG dropped last night to 56. Most likely this was due to the fact that 70/30 was started yesterday (first dose 1708) and the last dose of Levemir 10 units (8022 11/19) would have been still in her system.   Read RN note from this am about poor appetite/abdominal pain and patient refusing 70/30 insulin this am. If patient is not eating it is not approprite to give 70/30 as 30% of it acts as a meal coverage (after obtaining MD order to hold). However, please continue to cover with Novolog correction scale so patient doesn't have increase in blood glucose.    MD - If patient continues to not want to eat,  recommend stopping 70/30 and resuming Levemir 10 units BID as patient will need to have basal insulin. (70/30 was what she was on at home and was switched back to it yesterday as she was closer to going home.)    -- Will follow during hospitalization.--  Jonna Clark RN, MSN Diabetes Coordinator Inpatient Glycemic Control Team Team Pager: [phone] (8am-5pm)

## 2018-09-14 NOTE — Progress Notes (Addendum)
Pt has been complaining of distended abdomen, pain on upper abdomen through out the night. See mar for prn medication administered, refused to take pain meds. Was nausated this am, prn zofran given. Pt has not been eating and drinking good. Pt states evry time she eats or drink her abdomen get very distended. Pt did not want insulin this am, CBG 130. Pt ate some fruits this am.Ambulated 500 ft without any difficulty. Pt's son in the room. Will continue to monitor.

## 2018-09-15 LAB — BASIC METABOLIC PANEL
ANION GAP: 8 (ref 5–15)
ANION GAP: 11 (ref 5–15)
BUN: 40 mg/dL — ABNORMAL HIGH (ref 8–23)
BUN: 39 mg/dL — ABNORMAL HIGH (ref 8–23)
CALCIUM: 8.4 mg/dL — AB (ref 8.9–10.3)
CHLORIDE: 95 mmol/L — AB (ref 98–111)
CO2: 20 mmol/L — AB (ref 22–32)
CO2: 17 mmol/L — ABNORMAL LOW (ref 22–32)
CREATININE: 2.05 mg/dL — AB (ref 0.44–1.00)
Calcium: 8.8 mg/dL — ABNORMAL LOW (ref 8.9–10.3)
Chloride: 94 mmol/L — ABNORMAL LOW (ref 98–111)
Creatinine, Ser: 2.18 mg/dL — ABNORMAL HIGH (ref 0.44–1.00)
GFR calc non Af Amer: 23 mL/min — ABNORMAL LOW (ref >60)
GFR calc non Af Amer: 24 mL/min — ABNORMAL LOW (ref >60)
GFR, EST AFRICAN AMERICAN: 26 mL/min — AB (ref >60)
GFR, EST AFRICAN AMERICAN: 28 mL/min — AB (ref >60)
Glucose, Bld: 140 mg/dL — ABNORMAL HIGH (ref 70–99)
Glucose, Bld: 154 mg/dL — ABNORMAL HIGH (ref 70–99)
POTASSIUM: 4.6 mmol/L (ref 3.5–5.1)
POTASSIUM: 4.9 mmol/L (ref 3.5–5.1)
SODIUM: 122 mmol/L — AB (ref 135–145)
Sodium: 123 mmol/L — ABNORMAL LOW (ref 135–145)

## 2018-09-15 LAB — GLUCOSE, CAPILLARY
Glucose-Capillary: 149 mg/dL — ABNORMAL HIGH (ref 70–99)
Glucose-Capillary: 250 mg/dL — ABNORMAL HIGH (ref 70–99)
Glucose-Capillary: 214 mg/dL — ABNORMAL HIGH (ref 70–99)
Glucose-Capillary: 155 mg/dL — ABNORMAL HIGH (ref 70–99)

## 2018-09-15 LAB — FERRITIN: FERRITIN: 121 ng/mL (ref 11–307)

## 2018-09-15 LAB — TSH: TSH: 5.284 u[IU]/mL — ABNORMAL HIGH (ref 0.350–4.500)

## 2018-09-15 LAB — IRON AND TIBC
Iron: 34 ug/dL (ref 28–170)
SATURATION RATIOS: 11 % (ref 10.4–31.8)
TIBC: 312 ug/dL (ref 250–450)
UIBC: 278 ug/dL

## 2018-09-15 LAB — SODIUM
Sodium: 122 mmol/L — ABNORMAL LOW (ref 135–145)
Sodium: 140 mmol/L (ref 135–145)

## 2018-09-15 LAB — OSMOLALITY, URINE: OSMOLALITY UR: 214 mosm/kg — AB (ref 300–900)

## 2018-09-15 LAB — OSMOLALITY: OSMOLALITY: 272 mosm/kg — AB (ref 275–295)

## 2018-09-15 LAB — CORTISOL: Cortisol, Plasma: 19.8 ug/dL

## 2018-09-15 MED ORDER — SIMETHICONE 80 MG PO CHEW
80.0000 mg | CHEWABLE_TABLET | Freq: Four times a day (QID) | ORAL | Status: AC
  Administered 2018-09-15 – 2018-09-16 (×4): 80 mg via ORAL
  Filled 2018-09-15 (×4): qty 1

## 2018-09-15 MED ORDER — INFLUENZA VAC SPLIT HIGH-DOSE 0.5 ML IM SUSY
0.5000 mL | PREFILLED_SYRINGE | INTRAMUSCULAR | Status: AC
  Administered 2018-09-16: 0.5 mL via INTRAMUSCULAR
  Filled 2018-09-15: qty 0.5

## 2018-09-15 MED ORDER — METOCLOPRAMIDE HCL 5 MG/ML IJ SOLN
5.0000 mg | Freq: Four times a day (QID) | INTRAMUSCULAR | Status: DC
  Administered 2018-09-15 – 2018-09-22 (×27): 5 mg via INTRAVENOUS
  Filled 2018-09-15 (×26): qty 2

## 2018-09-15 MED ORDER — SODIUM CHLORIDE 0.9 % IV SOLN
INTRAVENOUS | Status: DC
  Administered 2018-09-15 – 2018-09-16 (×2): via INTRAVENOUS

## 2018-09-15 MED ORDER — PNEUMOCOCCAL VAC POLYVALENT 25 MCG/0.5ML IJ INJ
0.5000 mL | INJECTION | INTRAMUSCULAR | Status: AC
  Administered 2018-09-16: 0.5 mL via INTRAMUSCULAR
  Filled 2018-09-15: qty 0.5

## 2018-09-15 MED ORDER — HYDRALAZINE HCL 50 MG PO TABS
50.0000 mg | ORAL_TABLET | Freq: Three times a day (TID) | ORAL | Status: DC
  Administered 2018-09-15 – 2018-09-22 (×22): 50 mg via ORAL
  Filled 2018-09-15 (×22): qty 1

## 2018-09-15 MED ORDER — INSULIN DETEMIR 100 UNIT/ML ~~LOC~~ SOLN
15.0000 [IU] | Freq: Two times a day (BID) | SUBCUTANEOUS | Status: DC
  Administered 2018-09-15 – 2018-09-21 (×12): 15 [IU] via SUBCUTANEOUS
  Filled 2018-09-15 (×12): qty 0.15

## 2018-09-15 MED ORDER — TOLVAPTAN 15 MG PO TABS
15.0000 mg | ORAL_TABLET | ORAL | Status: AC
  Administered 2018-09-15 – 2018-09-17 (×3): 15 mg via ORAL
  Filled 2018-09-15 (×3): qty 1

## 2018-09-15 NOTE — Consult Note (Signed)
[facility] Consultation Note  Referring Provider: Dr. Cyndia Bent (CVTS) Primary Care Physician:  Patient, No Pcp Per  Reason for Consultation:  distention  HPI: Joy Boyd is a 67 y.o. female three weeks out from CABG.  Asked to see for bloating, fullness, early satiety, nausea; for the past several days; having some modest loose stools. Xray no ileus.  No blood in stool.  No prior similar symptoms.  No prior endoscopy or colonoscopy.   Past Medical History:  Diagnosis Date  . Acute on chronic combined systolic and diastolic CHF (congestive heart failure) ([place]) 08/23/2018  . Coronary artery disease   . Diabetes mellitus type 2 in obese ([place]) 08/23/2018  . Diabetes mellitus without complication ([place])   . Hypertension   . Pure hypercholesterolemia 08/23/2018  . PVC (premature ventricular contraction) 08/23/2018    Past Surgical History:  Procedure Laterality Date  . CORONARY ARTERY BYPASS GRAFT N/A 08/29/2018   Procedure: CORONARY ARTERY BYPASS GRAFTING (CABG) x three, using left internal mammary artery and right leg greater saphenous vein harvested endoscopically;  Surgeon: Gaye Pollack, MD;  Location: [facility];  Service: Open Heart Surgery;  Laterality: N/A;  . LEFT HEART CATH AND CORONARY ANGIOGRAPHY N/A 08/23/2018   Procedure: LEFT HEART CATH AND CORONARY ANGIOGRAPHY;  Surgeon: Leonie Man, MD;  Location: [facility];  Service: Cardiovascular;  Laterality: N/A;  . RIGHT HEART CATH N/A 08/25/2018   Procedure: RIGHT HEART CATH;  Surgeon: Belva Crome, MD;  Location: [facility];  Service: Cardiovascular;  Laterality: N/A;  . TEE WITHOUT CARDIOVERSION N/A 08/29/2018   Procedure: TRANSESOPHAGEAL ECHOCARDIOGRAM (TEE);  Surgeon: Gaye Pollack, MD;  Location: [facility];  Service: Open Heart Surgery;  Laterality: N/A;    Prior to Admission medications   Medication Sig Start Date End Date Taking? Authorizing Provider  aspirin 325 MG tablet Take 150 mg by mouth 3  (three) times a week.    Yes [provider]  atorvastatin (LIPITOR) 20 MG tablet Take 20 mg by mouth daily at 6 PM.    Yes [provider]  bisoprolol (ZEBETA) 5 MG tablet Take 5 mg by mouth daily.   Yes [provider]  clopidogrel (PLAVIX) 75 MG tablet Take 75 mg by mouth daily.   Yes [provider]  insulin NPH-regular Human (NOVOLIN 70/30) (70-30) 100 UNIT/ML injection Inject 25-35 Units into the skin See admin instructions. Inject 35 units am, 25 units evening.   Yes [provider]  nitroGLYCERIN (NITROSTAT) 0.4 MG SL tablet Place 2.6 mg under the tongue at bedtime. **Nitromint**   Yes [provider]  valsartan-hydrochlorothiazide (DIOVAN-HCT) 160-12.5 MG tablet Take 1 tablet by mouth daily.   Yes [provider]  acetaminophen (TYLENOL) 325 MG tablet Take 2 tablets (650 mg total) by mouth every 6 (six) hours as needed for mild pain. 09/09/18   Elgie Collard, PA-C  amLODipine (NORVASC) 10 MG tablet Take 1 tablet (10 mg total) by mouth daily. 09/10/18   Elgie Collard, PA-C  aspirin EC 81 MG EC tablet Take 1 tablet (81 mg total) by mouth daily. 09/10/18   Elgie Collard, PA-C  atorvastatin (LIPITOR) 80 MG tablet Take 1 tablet (80 mg total) by mouth daily. 09/10/18   Elgie Collard, PA-C  bisacodyl (DULCOLAX) 10 MG suppository Place 1 suppository (10 mg total) rectally daily as needed for moderate constipation. 09/14/18   Elgie Collard, PA-C  carvedilol (COREG) 25 MG tablet Take 1  tablet (25 mg total) by mouth 2 (two) times daily with a meal. 09/09/18   Elgie Collard, PA-C  docusate sodium (COLACE) 100 MG capsule Take 1 capsule (100 mg total) by mouth 2 (two) times daily. 09/14/18   Elgie Collard, PA-C  hydrALAZINE (APRESOLINE) 25 MG tablet Take 1 tablet (25 mg total) by mouth every 8 (eight) hours. 09/09/18   Elgie Collard, PA-C  insulin aspart protamine- aspart (NOVOLOG MIX 70/30) (70-30) 100 UNIT/ML injection Inject 0.15 mLs  (15 Units total) into the skin 2 (two) times daily with a meal. 09/14/18   Harriet Pho, Tessa N, PA-C  senna (SENOKOT) 8.6 MG TABS tablet Take 1 tablet (8.6 mg total) by mouth 2 (two) times daily. 09/14/18   Elgie Collard, PA-C  traMADol (ULTRAM) 50 MG tablet Take 1 tablet (50 mg total) by mouth every 12 (twelve) hours as needed for moderate pain. 09/09/18   Elgie Collard, PA-C    Current Facility-Administered Medications  Medication Dose Route Frequency Provider Last Rate Last Dose  . 0.9 %  sodium chloride infusion   Intravenous Continuous Gaye Pollack, MD      . acetaminophen (TYLENOL) tablet 650 mg  650 mg Oral Q6H PRN Gaye Pollack, MD   650 mg at 09/03/18 1741  . amLODipine (NORVASC) tablet 10 mg  10 mg Oral Daily Barrett, Erin R, PA-C   10 mg at 09/14/18 0944  . aspirin EC tablet 81 mg  81 mg Oral Daily Gaye Pollack, MD   81 mg at 09/14/18 0944  . atorvastatin (LIPITOR) tablet 80 mg  80 mg Oral Daily Gaye Pollack, MD   80 mg at 09/14/18 0944  . bisacodyl (DULCOLAX) suppository 10 mg  10 mg Rectal Daily PRN Janora Norlander, MD   10 mg at 09/10/18 1809  . budesonide (PULMICORT) nebulizer solution 0.25 mg  0.25 mg Inhalation BID Gaye Pollack, MD   0.25 mg at 09/15/18 0754  . carvedilol (COREG) tablet 25 mg  25 mg Oral BID WC Harriet Pho, Tessa N, PA-C   25 mg at 09/15/18 0916  . clopidogrel (PLAVIX) tablet 75 mg  75 mg Oral Daily Gaye Pollack, MD   75 mg at 09/14/18 0943  . docusate sodium (COLACE) capsule 100 mg  100 mg Oral BID Janora Norlander, MD   100 mg at 09/14/18 2246  . enoxaparin (LOVENOX) injection 30 mg  30 mg Subcutaneous QHS Grace Isaac, MD   30 mg at 09/14/18 2246  . hydrALAZINE (APRESOLINE) tablet 50 mg  50 mg Oral Q8H Conte, [place], [place]      . HYDROcodone-acetaminophen (NORCO/VICODIN) 5-325 MG per tablet 2 tablet  2 tablet Oral Once Schorr, Rhetta Mura, NP      . insulin aspart (novoLOG) injection 0-24 Units  0-24 Units Subcutaneous TID AC & HS Gaye Pollack, MD   2 Units at 09/15/18 [phone]  . insulin detemir (LEVEMIR) injection 15 Units  15 Units Subcutaneous BID Alekh, Kshitiz, MD      . lactulose (CHRONULAC) 10 GM/15ML solution 10 g  10 g Oral Daily PRN Grace Isaac, MD   10 g at 09/10/18 1338  . metoCLOPramide (REGLAN) injection 5 mg  5 mg Intravenous Q6H Bartle, Fernande Boyden, MD      . ondansetron (ZOFRAN) tablet 4 mg  4 mg Oral Q6H PRN Gaye Pollack, MD       Or  . ondansetron [employer]) injection  4 mg  4 mg Intravenous Q6H PRN Gaye Pollack, MD   4 mg at 09/14/18 0600  . pantoprazole (PROTONIX) EC tablet 40 mg  40 mg Oral QAC breakfast Gaye Pollack, MD   40 mg at 09/15/18 1610  . polyethylene glycol (MIRALAX / GLYCOLAX) packet 17 g  17 g Oral Daily Elgie Collard, PA-C   17 g at 09/13/18 0820  . senna (SENOKOT) tablet 8.6 mg  1 tablet Oral BID Lavina Hamman, MD   8.6 mg at 09/14/18 2246  . simethicone (MYLICON) chewable tablet 80 mg  80 mg Oral QID Elmarie Shiley, MD      . sodium chloride flush (NS) 0.9 % injection 3 mL  3 mL Intravenous Q12H Gaye Pollack, MD   3 mL at 09/14/18 2246  . sodium chloride flush (NS) 0.9 % injection 3 mL  3 mL Intravenous PRN Gaye Pollack, MD      . tolvaptan (SAMSCA) tablet 15 mg  15 mg Oral Q24H Gaye Pollack, MD   15 mg at 09/15/18 0915  . traMADol (ULTRAM) tablet 50 mg  50 mg Oral Q12H PRN Gaye Pollack, MD   50 mg at 09/11/18 9604    Allergies as of 08/20/2018  . (No Known Allergies)    Family History  Problem Relation Age of Onset  . CAD Father   . Hypertension Father     Social History   Socioeconomic History  . Marital status: Married    Spouse name: Not on file  . Number of children: Not on file  . Years of education: Not on file  . Highest education level: Not on file  Occupational History  . Not on file  Social Needs  . Financial resource strain: Not on file  . Food insecurity:    Worry: Not on file    Inability: Not on file  . Transportation needs:    Medical: Not  on file    Non-medical: Not on file  Tobacco Use  . Smoking status: Never Smoker  . Smokeless tobacco: Never Used  Substance and Sexual Activity  . Alcohol use: Never    Frequency: Never  . Drug use: Never  . Sexual activity: Not on file  Lifestyle  . Physical activity:    Days per week: Not on file    Minutes per session: Not on file  . Stress: Not on file  Relationships  . Social connections:    Talks on phone: Not on file    Gets together: Not on file    Attends religious service: Not on file    Active member of club or organization: Not on file    Attends meetings of clubs or organizations: Not on file    Relationship status: Not on file  . Intimate partner violence:    Fear of current or ex partner: Not on file    Emotionally abused: Not on file    Physically abused: Not on file    Forced sexual activity: Not on file  Other Topics Concern  . Not on file  Social History Narrative  . Not on file    Review of Systems: As per HPI  Physical Exam: Vital signs in last 24 hours: Temp:  [97.5 F (36.4 C)-97.9 F (36.6 C)] 97.5 F (36.4 C) (11/21 0917) Pulse Rate:  [68-73] 68 (11/21 0431) Resp:  [20-26] 26 (11/21 0917) BP: (140-160)/(48-92) 160/92 (11/21 0917) SpO2:  [93 %-96 %] 94 % (  11/21 0756) Weight:  [75.1 kg] 75.1 kg (11/21 0431) Last BM Date: 09/14/18 General:   Alert,  Somnolent but arousable, Well-developed, well-nourished, pleasant and cooperative in NAD Head:  Normocephalic and atraumatic. Eyes:  Sclera clear, no icterus.   Conjunctiva pink. Ears:  Normal auditory acuity. Nose:  No deformity, discharge,  or lesions. Mouth:  No deformity or lesions.  Oropharynx pink & moist. Neck:  Supple; no masses or thyromegaly. Lungs:  Clear throughout to auscultation.   No wheezes, crackles, or rhonchi. No acute distress. Heart:  Irregular rate and rhythm; no murmurs, clicks, rubs,  or gallops. Abdomen:  Soft, nontender and very modestly distended. No masses,  hepatosplenomegaly or hernias noted. Normal bowel sounds, without guarding, and without rebound.     Msk:  Symmetrical without gross deformities. Normal posture. Pulses:  Normal pulses noted. Extremities:  Without clubbing or edema. Neurologic:  Alert and  oriented x4;  grossly normal neurologically. Skin:  Intact without significant lesions or rashes. Psych:  Alert and cooperative. Normal mood and affect.   Lab Results: Recent Labs    09/13/18 0319  WBC 9.5  HGB 9.2*  HCT 28.0*  PLT 340   BMET Recent Labs    09/14/18 0317 09/15/18 0354 09/15/18 0801  NA 125* 123* 122*  K 4.6 4.6 4.9  CL 97* 95* 94*  CO2 19* 20* 17*  GLUCOSE 140* 140* 154*  BUN 37* 40* 39*  CREATININE 2.24* 2.18* 2.05*  CALCIUM 8.5* 8.4* 8.8*   LFT Recent Labs    09/13/18 0319  PROT 6.1*  ALBUMIN 3.0*  AST 21  ALT 26  ALKPHOS 127*  BILITOT 0.6   PT/INR No results for input(s): LABPROT, INR in the last 72 hours.  Studies/Results: Dg Abd 1 View  Result Date: 09/14/2018 CLINICAL DATA:  Abdominal pain today. Recent open heart surgery. History of diabetes and hypertension. EXAM: ABDOMEN - 1 VIEW COMPARISON:  Abdominal radiographs 09/10/2018. FINDINGS: The bowel gas pattern is normal. There is no supine evidence of free intraperitoneal air. There are no suspicious abdominal calcifications. There are probable splenic artery calcifications. Left pleural effusion and retrocardiac left basilar airspace disease are similar to recent chest radiographs. No acute osseous findings are evident. IMPRESSION: No radiographic evidence of active abdominal process. Electronically Signed   By: Richardean Sale M.D.   On: 09/14/2018 13:51    Impression:  1.  Bloating.  Suspect patient has some degree of gastroparesis and constipation, likely exacerbated by her recent cardiac surgery, multiple gut-slowing medications, as well as relative poor mobility.  Plan:  1.  Simethicone and metoclopramide have been  ordered. 2.  Scheduled antiemetics for next 2-3 days. 3.  Would downgrade diet to gastroparesis-type diet. 4.  Continue laxatives. 5.  If symptoms persist despite above measures, consider more aggressive bowel purge (xray on my review shows lots of stool). 6.  [facility] will follow.   LOS: 24 days   Lorrie Gargan M  09/15/2018, 11:54 AM  Cell [phone] If no answer or after 5 PM call [phone]

## 2018-09-15 NOTE — Progress Notes (Addendum)
[address]       [place] [address]             [phone]      17 Days Post-Op Procedure(s) (LRB): CORONARY ARTERY BYPASS GRAFTING (CABG) x three, using left internal mammary artery and right leg greater saphenous vein harvested endoscopically (N/A) TRANSESOPHAGEAL ECHOCARDIOGRAM (TEE) (N/A) Subjective: Continues to have abdominal fullness this morning. She states its very uncomfortable. She still is unable to eat more than a few bites at a time.  Objective: Vital signs in last 24 hours: Temp:  [97.9 F (36.6 C)] 97.9 F (36.6 C) (11/21 0431) Pulse Rate:  [68-73] 68 (11/21 0431) Cardiac Rhythm: Normal sinus rhythm;Bundle branch block (11/21 0721) Resp:  [20-22] 20 (11/21 0431) BP: (140-160)/(48-61) 158/49 (11/21 0431) SpO2:  [93 %-96 %] 94 % (11/21 0756) Weight:  [75.1 kg] 75.1 kg (11/21 0431)     Intake/Output from previous day: 11/20 0701 - 11/21 0700 In: 240 [P.O.:240] Out: -  Intake/Output this shift: No intake/output data recorded.  General appearance: alert, cooperative and no distress Heart: regular rate and rhythm, S1, S2 normal, no murmur, click, rub or gallop Lungs: clear to auscultation bilaterally Abdomen: soft, non-tender; bowel sounds normal; no masses,  no organomegaly Extremities: 1-2 + nonpitting edema in lower extremity bilaterally Wound: clean and dry  Lab Results: Recent Labs    09/13/18 0319  WBC 9.5  HGB 9.2*  HCT 28.0*  PLT 340   BMET:  Recent Labs    09/14/18 0317 09/15/18 0354  NA 125* 123*  K 4.6 4.6  CL 97* 95*  CO2 19* 20*  GLUCOSE 140* 140*  BUN 37* 40*  CREATININE 2.24* 2.18*  CALCIUM 8.5* 8.4*    PT/INR: No results for input(s): LABPROT, INR in the last 72 hours. ABG    Component Value Date/Time   PHART 7.342 (L) 08/30/2018 0133   HCO3 23.3 08/30/2018 0133   TCO2 25 08/30/2018 1523   ACIDBASEDEF 2.0 08/30/2018 0133   O2SAT 75.1 08/31/2018 0400   CBG (last 3)  Recent Labs     09/14/18 1647 09/14/18 2117 09/15/18 0603  GLUCAP 259* 244* 149*    Assessment/Plan: S/P Procedure(s) (LRB): CORONARY ARTERY BYPASS GRAFTING (CABG) x three, using left internal mammary artery and right leg greater saphenous vein harvested endoscopically (N/A) TRANSESOPHAGEAL ECHOCARDIOGRAM (TEE) (N/A)  1. CV-hypertensive at times. Bigeminy in the 80s.  She is on Norvasc 10mg , Coreg 25mg  BID, lipitor, and ASA. She is also on Hydralazine 25mg  TID-I will increase to 50mg  TID since she is consistently high with her Bps. 2. Pulm-Tolerating room air with good saturation 3. Renal-creatinine 2.18 today, hyponatremia-will start on tolvaptan.  4. Abdominal fullness-she is having bowel movements but after a few bites of food she feels full of gas and cannot eat anything else. KUB was normal. She has been on protonix.  Appreciate medicine's help. Plan to consult GI.  5. DM-continue levemir 10 units BID and SSI. She still isn't eating much due to fullness.   Plan: Continue daily weights. She continues to have abdominal discomfort- will consult GI to assist. Dr. Posey Pronto also to help with renal optimization. She remains fluid overloaded but we are holding the lasix due to her creatinine. Continue ambulation and use of incentive spirometer. Her echocardiogram showed an EF of 50-55% which is a vast improvement with paradoxical septal motion.    LOS: 24 days    Elgie Collard 09/15/2018   Chart  reviewed, patient examined, agree with above. She is doing well from a cardiovascular standpoint with much improved EF after CABG to 50-55%. Rhythms is stable and VS have been stable.  Renal function is still a concern and she may be intravascularly dry since she has not been taking much po even though her weight is 10+ lbs over proep. Last urine Na was less than 10. Will start some IVF at 50, 0.9 NS. She has developed progressive hyponatremia and down to 123 this am. Will give her a few days of tolvaptan and in  addition to NS this should correct this. I have consulted nephrology to help with management of her kidneys.  He main complaint is abdominal fullness, a lot of belching, poor appetite. 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 Have consulted GI. Will add some Reglan to see if that helps. She is having small BM's, has bowel sounds, KUB unremarkable and abdomen is benign.

## 2018-09-15 NOTE — Progress Notes (Signed)
Patient ambulated 200 feet in hallway and tolerated poorly for second half of walk. She states her abdomen feels it is pushing into her lungs and making it hard to breathe. Upon return to room, sats 96% on room air. She recovered quickly and is resting in room with family. She did refuse to walk any other times on day shift today stating she did not feel up to it. Encouraged walking and explained risk factors of not. She and family verbalize understanding.

## 2018-09-15 NOTE — Progress Notes (Signed)
Patient ID: Joy Boyd, female   DOB: 11-Dec-1950, 67 y.o.   MRN: [id number]  Consultation PROGRESS NOTE    Joy Boyd  DGL:[id number] DOB: [birth year] DOA: 08/20/2018 PCP: Patient, No Pcp Per   Brief Narrative:  67 year old female with history of CAD, chronic systolic CHF, labs mellitus type II, chronic kidney disease stage II-III, hyperlipidemia  underwent CABG on 08/29/2018.  She has been recovering fairly well from surgery with exception of mild volume overload for which she was diuresed with IV Lasix, which is also improving.  TRH was asked to see the patient for ongoing abdominal discomfort, bloating and constipation since surgery.  Patient was found to be constipated since 09/24/2018.  She was started on MiraLAX, simethicone and Zofran.  Assessment & Plan:   Principal Problem:   Acute on chronic combined systolic and diastolic CHF (congestive heart failure) (HCC) Active Problems:   Coronary artery disease involving native coronary artery of native heart with angina pectoris (HCC)   Unstable angina (HCC)   Abnormal nuclear stress test   PVC (premature ventricular contraction)   Diabetes mellitus type 2 in obese ([place])   Pure hypercholesterolemia   CAD S/P percutaneous coronary angioplasty   Dilated cardiomyopathy (HCC)   CAD (coronary artery disease)   Constipation by delayed colonic transit   Constipation, bloating, abdominal discomfort -Still complaining of intermittent symptoms. -Continue Colace, MiraLAX, Senokot and Protonix. -Consider GI evaluation  Acute kidney injury on chronic kidney disease stage II-III -Baseline creatinine is around 1.5-1.7 -Creatinine is 2.05 today.  No hydronephrosis on renal ultrasound -Diuresis on hold.  Off IV fluids -Patient appears to be fluid overloaded.  Consider nephrology evaluation considering renal function and worsening sodium.  Hyponatremia -Sodium 123 today.  Monitor. consider nephrology evaluation.  Strict input and output.  Daily  weights  Diabetes mellitus type II -With intermittent hypoglycemia and hyperglycemia.  NovoLog 70/30 was discontinued on 09/14/2018 and Levemir was started.  Blood sugars trending upwards.  Will increase Levemir to 15 units twice daily.  Continue Accu-Cheks with coverage.  Coronary artery disease status post recent CABG -Care as per primary team   Subjective: Patient seen and examined at bedside.  She is awake.  Translation is provided by daughter-in-law present at bedside.  Patient still complains of intermittent abdominal distention and fullness.  No overnight fever or vomiting.  Objective: Vitals:   09/14/18 2000 09/15/18 0431 09/15/18 0756 09/15/18 0917  BP: (!) 142/48 (!) 158/49  (!) 160/92  Pulse: 73 68    Resp: (!) 22 20  (!) 26  Temp: 97.9 F (36.6 C) 97.9 F (36.6 C)  (!) 97.5 F (36.4 C)  TempSrc: Oral Oral  Oral  SpO2: 96% 93% 94%   Weight:  75.1 kg    Height:        Intake/Output Summary (Last 24 hours) at 09/15/2018 1124 Last data filed at 09/15/2018 0600 Gross per 24 hour  Intake 240 ml  Output -  Net 240 ml   Filed Weights   09/13/18 0313 09/14/18 0546 09/15/18 0431  Weight: 73.3 kg 73.4 kg 75.1 kg    Examination:  General exam: Awake, no distress Respiratory system: Bilateral decreased breath sounds at bases, no wheezing Cardiovascular system: S1 & S2 heard, Rate controlled Abdomen: Distended, nontender, bowel sounds positive, no organomegaly Extremity: Bilateral 1-2+ edema, no cyanosis   Data Reviewed: I have personally reviewed following labs and imaging studies  CBC: Recent Labs  Lab 09/11/18 0825 09/12/18 0334 09/13/18 0319  WBC 10.0 10.0 9.5  NEUTROABS 7.9* 7.8*  --   HGB 8.8* 8.4* 9.2*  HCT 27.9* 26.9* 28.0*  MCV 86.1 84.6 84.8  PLT 308 345 458   Basic Metabolic Panel: Recent Labs  Lab 09/10/18 1641 09/11/18 0826 09/12/18 0334 09/13/18 0319 09/14/18 0317 09/15/18 0354 09/15/18 0801  NA 129* 129* 127* 128* 125* 123* 122*    K 5.3* 4.7 4.4 4.2 4.6 4.6 4.9  CL 101 100 100 97* 97* 95* 94*  CO2 19* 19* 20* 20* 19* 20* 17*  GLUCOSE 159* 131* 148* 98 140* 140* 154*  BUN 51* 49* 48* 40* 37* 40* 39*  CREATININE 2.63* 2.27* 2.24* 2.01* 2.24* 2.18* 2.05*  CALCIUM 8.9 8.8* 8.4* 8.9 8.5* 8.4* 8.8*  MG  --   --  1.9  --   --   --   --   PHOS 4.8* 4.2  --   --   --   --   --    GFR: Estimated Creatinine Clearance: 25.4 mL/min (A) (by C-G formula based on SCr of 2.05 mg/dL (H)). Liver Function Tests: Recent Labs  Lab 09/11/18 0826 09/12/18 0334 09/13/18 0319  AST  --  22 21  ALT  --  28 26  ALKPHOS  --  137* 127*  BILITOT  --  0.6 0.6  PROT  --  5.6* 6.1*  ALBUMIN 2.7* 2.7* 3.0*   No results for input(s): LIPASE, AMYLASE in the last 168 hours. No results for input(s): AMMONIA in the last 168 hours. Coagulation Profile: No results for input(s): INR, PROTIME in the last 168 hours. Cardiac Enzymes: No results for input(s): CKTOTAL, CKMB, CKMBINDEX, TROPONINI in the last 168 hours. BNP (last 3 results) No results for input(s): PROBNP in the last 8760 hours. HbA1C: No results for input(s): HGBA1C in the last 72 hours. CBG: Recent Labs  Lab 09/14/18 1144 09/14/18 1506 09/14/18 1647 09/14/18 2117 09/15/18 0603  GLUCAP 312* 295* 259* 244* 149*   Lipid Profile: No results for input(s): CHOL, HDL, LDLCALC, TRIG, CHOLHDL, LDLDIRECT in the last 72 hours. Thyroid Function Tests: No results for input(s): TSH, T4TOTAL, FREET4, T3FREE, THYROIDAB in the last 72 hours. Anemia Panel: No results for input(s): VITAMINB12, FOLATE, FERRITIN, TIBC, IRON, RETICCTPCT in the last 72 hours. Sepsis Labs: No results for input(s): PROCALCITON, LATICACIDVEN in the last 168 hours.  No results found for this or any previous visit (from the past 240 hour(s)).       Radiology Studies: Dg Abd 1 View  Result Date: 09/14/2018 CLINICAL DATA:  Abdominal pain today. Recent open heart surgery. History of diabetes and  hypertension. EXAM: ABDOMEN - 1 VIEW COMPARISON:  Abdominal radiographs 09/10/2018. FINDINGS: The bowel gas pattern is normal. There is no supine evidence of free intraperitoneal air. There are no suspicious abdominal calcifications. There are probable splenic artery calcifications. Left pleural effusion and retrocardiac left basilar airspace disease are similar to recent chest radiographs. No acute osseous findings are evident. IMPRESSION: No radiographic evidence of active abdominal process. Electronically Signed   By: Richardean Sale M.D.   On: 09/14/2018 13:51        Scheduled Meds: . amLODipine  10 mg Oral Daily  . aspirin EC  81 mg Oral Daily  . atorvastatin  80 mg Oral Daily  . budesonide  0.25 mg Inhalation BID  . carvedilol  25 mg Oral BID WC  . clopidogrel  75 mg Oral Daily  . docusate sodium  100 mg Oral BID  . enoxaparin (LOVENOX) injection  30 mg Subcutaneous QHS  . hydrALAZINE  50 mg Oral Q8H  . HYDROcodone-acetaminophen  2 tablet Oral Once  . insulin aspart  0-24 Units Subcutaneous TID AC & HS  . insulin detemir  10 Units Subcutaneous BID  . metoCLOPramide (REGLAN) injection  5 mg Intravenous Q6H  . pantoprazole  40 mg Oral QAC breakfast  . polyethylene glycol  17 g Oral Daily  . senna  1 tablet Oral BID  . simethicone  80 mg Oral QID  . sodium chloride flush  3 mL Intravenous Q12H  . tolvaptan  15 mg Oral Q24H   Continuous Infusions: . sodium chloride       LOS: 24 days        Aline August, MD [facility] Pager [phone]  If 7PM-7AM, please contact night-coverage [email or web address] Password [facility] 09/15/2018, 11:24 AM

## 2018-09-15 NOTE — Consult Note (Signed)
Reason for Consult: Hyponatremia, volume overload, chronic kidney disease stage IV Referring Physician: Gilford Raid, MD (TCTS)  HPI:  67 year old woman of [place] origin with past medical history significant for coronary artery disease, chronic systolic/diastolic congestive heart failure, chronic kidney disease stage III (creatinine 1.5-1.7) and dyslipidemia who presented to the hospital over 3 weeks ago with atypical chest pain.  Coronary angiogram that was consequently done showed 85% distal left main stenosis extending into the ostium of the LAD and 75% ostial left circumflex stenosis for which he underwent triple-vessel coronary artery bypass grafting.  Her postoperative course has been punctuated by problems with volume overload and worsening renal function with efforts at volume unloading.  She has had problems overnight with abdominal discomfort/bloating and constipation.  Labs are also significant for progressive development of hyponatremia in the setting of volume excess.  Previously check labs are significant for a serum osmolality of 294 with sodium of 129 on 11/17.  Earlier today, she was started on tolvaptan.  Earlier labs during her hospitalization are significant for sodium level within normal range except when associated with hyperglycemia.  Past Medical History:  Diagnosis Date  . Acute on chronic combined systolic and diastolic CHF (congestive heart failure) ([place]) 08/23/2018  . Coronary artery disease   . Diabetes mellitus type 2 in obese ([place]) 08/23/2018  . Diabetes mellitus without complication ([place])   . Hypertension   . Pure hypercholesterolemia 08/23/2018  . PVC (premature ventricular contraction) 08/23/2018    Past Surgical History:  Procedure Laterality Date  . CORONARY ARTERY BYPASS GRAFT N/A 08/29/2018   Procedure: CORONARY ARTERY BYPASS GRAFTING (CABG) x three, using left internal mammary artery and right leg greater saphenous vein harvested endoscopically;   Surgeon: Gaye Pollack, MD;  Location: [facility];  Service: Open Heart Surgery;  Laterality: N/A;  . LEFT HEART CATH AND CORONARY ANGIOGRAPHY N/A 08/23/2018   Procedure: LEFT HEART CATH AND CORONARY ANGIOGRAPHY;  Surgeon: Leonie Man, MD;  Location: [facility];  Service: Cardiovascular;  Laterality: N/A;  . RIGHT HEART CATH N/A 08/25/2018   Procedure: RIGHT HEART CATH;  Surgeon: Belva Crome, MD;  Location: [facility];  Service: Cardiovascular;  Laterality: N/A;  . TEE WITHOUT CARDIOVERSION N/A 08/29/2018   Procedure: TRANSESOPHAGEAL ECHOCARDIOGRAM (TEE);  Surgeon: Gaye Pollack, MD;  Location: [facility];  Service: Open Heart Surgery;  Laterality: N/A;    Family History  Problem Relation Age of Onset  . CAD Father   . Hypertension Father     Social History:  reports that she has never smoked. She has never used smokeless tobacco. She reports that she does not drink alcohol or use drugs.  Allergies: No Known Allergies  Medications:  Scheduled: . amLODipine  10 mg Oral Daily  . aspirin EC  81 mg Oral Daily  . atorvastatin  80 mg Oral Daily  . budesonide  0.25 mg Inhalation BID  . carvedilol  25 mg Oral BID WC  . clopidogrel  75 mg Oral Daily  . docusate sodium  100 mg Oral BID  . enoxaparin (LOVENOX) injection  30 mg Subcutaneous QHS  . hydrALAZINE  50 mg Oral Q8H  . HYDROcodone-acetaminophen  2 tablet Oral Once  . insulin aspart  0-24 Units Subcutaneous TID AC & HS  . insulin detemir  10 Units Subcutaneous BID  . metoCLOPramide (REGLAN) injection  5 mg Intravenous Q6H  . pantoprazole  40 mg Oral QAC breakfast  . polyethylene glycol  17 g  Oral Daily  . senna  1 tablet Oral BID  . simethicone  80 mg Oral QID  . sodium chloride flush  3 mL Intravenous Q12H  . tolvaptan  15 mg Oral Q24H    BMP Latest Ref Rng & Units 09/15/2018 09/15/2018 09/14/2018  Glucose 70 - 99 mg/dL 154(H) 140(H) 140(H)  BUN 8 - 23 mg/dL 39(H) 40(H) 37(H)  Creatinine 0.44 - 1.00 mg/dL  2.05(H) 2.18(H) 2.24(H)  Sodium 135 - 145 mmol/L 122(L) 123(L) 125(L)  Potassium 3.5 - 5.1 mmol/L 4.9 4.6 4.6  Chloride 98 - 111 mmol/L 94(L) 95(L) 97(L)  CO2 22 - 32 mmol/L 17(L) 20(L) 19(L)  Calcium 8.9 - 10.3 mg/dL 8.8(L) 8.4(L) 8.5(L)   CBC Latest Ref Rng & Units 09/13/2018 09/12/2018 09/11/2018  WBC 4.0 - 10.5 K/uL 9.5 10.0 10.0  Hemoglobin 12.0 - 15.0 g/dL 9.2(L) 8.4(L) 8.8(L)  Hematocrit 36.0 - 46.0 % 28.0(L) 26.9(L) 27.9(L)  Platelets 150 - 400 K/uL 340 345 308     Dg Abd 1 View  Result Date: 09/14/2018 CLINICAL DATA:  Abdominal pain today. Recent open heart surgery. History of diabetes and hypertension. EXAM: ABDOMEN - 1 VIEW COMPARISON:  Abdominal radiographs 09/10/2018. FINDINGS: The bowel gas pattern is normal. There is no supine evidence of free intraperitoneal air. There are no suspicious abdominal calcifications. There are probable splenic artery calcifications. Left pleural effusion and retrocardiac left basilar airspace disease are similar to recent chest radiographs. No acute osseous findings are evident. IMPRESSION: No radiographic evidence of active abdominal process. Electronically Signed   By: Richardean Sale M.D.   On: 09/14/2018 13:51    Review of Systems  Constitutional: Positive for malaise/fatigue. Negative for chills and fever.  HENT: Negative.   Eyes: Negative.   Respiratory: Positive for shortness of breath. Negative for cough and hemoptysis.        She reports the inability to take a complete breath causing her shortness of breath  Cardiovascular: Positive for orthopnea and leg swelling. Negative for chest pain and palpitations.  Gastrointestinal: Positive for nausea. Negative for abdominal pain, diarrhea, heartburn and vomiting.       With abdominal distention  Genitourinary: Negative.   Musculoskeletal: Negative.   Skin: Negative.   Neurological: Positive for weakness. Negative for dizziness and headaches.   Blood pressure (!) 160/92, pulse 68,  temperature (!) 97.5 F (36.4 C), temperature source Oral, resp. rate (!) 26, height 5\' 1"  (1.549 m), weight 75.1 kg, SpO2 94 %. Physical Exam  Nursing note and vitals reviewed. Constitutional: She is oriented to person, place, and time. She appears well-developed and well-nourished.  Appears uncomfortable resting in bed-sleeping on right side  HENT:  Head: Normocephalic and atraumatic.  Mouth/Throat: Oropharynx is clear and moist. No oropharyngeal exudate.  Eyes: Pupils are equal, round, and reactive to light. Conjunctivae and EOM are normal. No scleral icterus.  Neck: Normal range of motion. Neck supple. No JVD present.  Cardiovascular: Normal rate, regular rhythm and normal heart sounds.  No murmur heard. Respiratory: Breath sounds normal. She has no wheezes. She has no rales.  Poor inspiratory effort with decreased breath sounds over bases  GI: Soft. Bowel sounds are normal. She exhibits distension. There is no tenderness. There is no guarding.  Tympanitic on percussion-no palpable fluid wave  Musculoskeletal: Normal range of motion. She exhibits edema.  1+ edema over lower extremities, 2+ edema presacral  Neurological: She is alert and oriented to person, place, and time.  Skin: Skin is warm and dry. No  rash noted. No erythema.    Assessment/Plan: 1.  Acute kidney injury on chronic kidney disease stage III versus progression: I suspect that this is largely hemodynamically mediated following coronary artery bypass grafting and possibly complicated by intermittent diuretic use/management of volume overload which is imperative to try and improve her respiratory status.  I reviewed her renal ultrasound that does not show any obstruction and physical exam did not show a distended bladder.  It is likely that she has mild intravascular volume depletion in the setting of interstitial excess.  I will re-dose her with furosemide when able to get off tolvaptan (otherwise, this risks more rapid rise  of sodium that could be deleterious). 2.  Hyponatremia: She is clinically hypervolemic on physical examination and this plausibly may be from her worsening renal function/CHF type symptoms with impaired water handling and activation of ADH excess.  I will check a TSH level, cortisol level and urine/serum osmolality again.  Agree with tolvaptan and cautious monitoring of sodium. 3.  Abdominal distention/discomfort: On physical examination, this appears to be consistent with gaseous distention-we will place her on scheduled simethicone for now.  She is currently on a PPI and gastroenterology has been consulted.  She was started on metoclopramide today for empiric management of possible gastroparesis. 4.  Coronary artery disease status post CABG: Postop day 17 with complicating problems with volume management/hyponatremia impairing progress. 5.  Anemia: Likely anemia of chronic kidney disease and complicated by recent surgery/events-we will check iron studies to decide on need for supplementation/ESA. 6.  Hypertension: Appears to be borderline elevated, likely secondary to her abdominal discomfort-we will follow trend to decide on need to adjust therapy.  Johnel Yielding K. 09/15/2018, 10:48 AM

## 2018-09-15 NOTE — Progress Notes (Signed)
1440 Came to see pt to walk. Family member met me at door and stated she was getting IV and not up to it. Told him I would talk with her RN but it would be good for her to walk. He was adamant that she could not walk as she was not feeling well. Will continue to follow. Graylon Good RN BSN 09/15/2018 2:46 PM

## 2018-09-15 NOTE — Plan of Care (Signed)
  Problem: Activity: Goal: Risk for activity intolerance will decrease Outcome: Not Progressing   Problem: Nutrition: Goal: Adequate nutrition will be maintained Outcome: Not Progressing   Problem: Elimination: Goal: Will not experience complications related to bowel motility Outcome: Not Progressing   Problem: Pain Managment: Goal: General experience of comfort will improve Outcome: Not Progressing

## 2018-09-16 ENCOUNTER — Inpatient Hospital Stay (HOSPITAL_COMMUNITY): Payer: Medicaid Other

## 2018-09-16 LAB — RENAL FUNCTION PANEL
ANION GAP: 11 (ref 5–15)
Albumin: 2.7 g/dL — ABNORMAL LOW (ref 3.5–5.0)
BUN: 40 mg/dL — ABNORMAL HIGH (ref 8–23)
CO2: 16 mmol/L — AB (ref 22–32)
CREATININE: 2.1 mg/dL — AB (ref 0.44–1.00)
Calcium: 8.5 mg/dL — ABNORMAL LOW (ref 8.9–10.3)
Chloride: 95 mmol/L — ABNORMAL LOW (ref 98–111)
GFR calc non Af Amer: 24 mL/min — ABNORMAL LOW (ref >60)
GFR, EST AFRICAN AMERICAN: 27 mL/min — AB (ref >60)
Glucose, Bld: 138 mg/dL — ABNORMAL HIGH (ref 70–99)
Phosphorus: 3.7 mg/dL (ref 2.5–4.6)
Potassium: 4.6 mmol/L (ref 3.5–5.1)
Sodium: 122 mmol/L — ABNORMAL LOW (ref 135–145)

## 2018-09-16 LAB — BASIC METABOLIC PANEL
ANION GAP: 8 (ref 5–15)
ANION GAP: 10 (ref 5–15)
BUN: 39 mg/dL — ABNORMAL HIGH (ref 8–23)
BUN: 41 mg/dL — ABNORMAL HIGH (ref 8–23)
CO2: 19 mmol/L — ABNORMAL LOW (ref 22–32)
CO2: 20 mmol/L — ABNORMAL LOW (ref 22–32)
Calcium: 8.9 mg/dL (ref 8.9–10.3)
Calcium: 8.7 mg/dL — ABNORMAL LOW (ref 8.9–10.3)
Chloride: 95 mmol/L — ABNORMAL LOW (ref 98–111)
Chloride: 92 mmol/L — ABNORMAL LOW (ref 98–111)
Creatinine, Ser: 2.12 mg/dL — ABNORMAL HIGH (ref 0.44–1.00)
Creatinine, Ser: 2.04 mg/dL — ABNORMAL HIGH (ref 0.44–1.00)
GFR calc Af Amer: 27 mL/min — ABNORMAL LOW (ref >60)
GFR calc Af Amer: 28 mL/min — ABNORMAL LOW (ref >60)
GFR, EST NON AFRICAN AMERICAN: 23 mL/min — AB (ref >60)
GFR, EST NON AFRICAN AMERICAN: 24 mL/min — AB (ref >60)
GLUCOSE: 143 mg/dL — AB (ref 70–99)
GLUCOSE: 134 mg/dL — AB (ref 70–99)
POTASSIUM: 4.6 mmol/L (ref 3.5–5.1)
POTASSIUM: 4.3 mmol/L (ref 3.5–5.1)
SODIUM: 122 mmol/L — AB (ref 135–145)
Sodium: 122 mmol/L — ABNORMAL LOW (ref 135–145)

## 2018-09-16 LAB — GLUCOSE, CAPILLARY
GLUCOSE-CAPILLARY: 142 mg/dL — AB (ref 70–99)
GLUCOSE-CAPILLARY: 152 mg/dL — AB (ref 70–99)
Glucose-Capillary: 128 mg/dL — ABNORMAL HIGH (ref 70–99)
Glucose-Capillary: 143 mg/dL — ABNORMAL HIGH (ref 70–99)

## 2018-09-16 MED ORDER — ENSURE ENLIVE PO LIQD
237.0000 mL | Freq: Three times a day (TID) | ORAL | Status: DC
  Administered 2018-09-17 – 2018-09-20 (×8): 237 mL via ORAL

## 2018-09-16 MED ORDER — STERILE WATER FOR INJECTION IV SOLN
INTRAVENOUS | Status: AC
  Administered 2018-09-16: 11:00:00 via INTRAVENOUS
  Filled 2018-09-16: qty 850

## 2018-09-16 MED ORDER — FUROSEMIDE 10 MG/ML IJ SOLN
80.0000 mg | Freq: Once | INTRAMUSCULAR | Status: AC
  Administered 2018-09-16: 80 mg via INTRAVENOUS
  Filled 2018-09-16: qty 8

## 2018-09-16 MED ORDER — SODIUM CHLORIDE 0.9 % IV SOLN
510.0000 mg | INTRAVENOUS | Status: DC
  Administered 2018-09-16: 510 mg via INTRAVENOUS
  Filled 2018-09-16: qty 17

## 2018-09-16 NOTE — Progress Notes (Signed)
Patient ID: Joy Boyd, female   DOB: 11/17/1950, 67 y.o.   MRN: [id number] [facility] Progress Note   Assessment/ Plan:   1.  Acute kidney injury on chronic kidney disease stage III versus progression: I suspect that this is largely hemodynamically mediated following coronary artery bypass grafting and possibly complicated by intermittent diuretic use/management of volume overload which is imperative to try and improve her respiratory status.  I will switch her fluid to isotonic sodium bicarbonate for the next 24 hours to try and correct her metabolic acidosis. 2.  Hyponatremia: She is clinically hypervolemic on physical examination and this plausibly may be from her worsening renal function/CHF type symptoms with impaired water handling and activation of ADH excess. TSH and cortisol unremarkable, unimpressive response to tolvaptan overnight, will give additional intravenous Lasix. 3.  Abdominal distention/discomfort: Improving with scheduled metoclopramide and simethicone-encouraged ambulation. 4.  Coronary artery disease status post CABG: Postop day 18 -sluggish progress due to intercurrent renal insufficiency/hyponatremia and abdominal symptoms.. 5.  Anemia: Likely anemia of chronic kidney disease and with significant iron deficiency, will give intravenous Feraheme. 6.  Hypertension: Appears to be borderline elevated, likely secondary to her abdominal discomfort-we will follow trend to decide on need to adjust therapy.  Subjective:   Reports some symptomatic improvement since yesterday with regards to abdominal distention however, still concerned about dyspnea/fatigue with exertion.   Objective:   BP (!) 153/53 (BP Location: Left Arm)   Pulse 68   Temp 98.1 F (36.7 C) (Oral)   Resp (!) 22   Ht 5\' 1"  (1.549 m)   Wt 78.5 kg   SpO2 95%   BMI 32.70 kg/m   Intake/Output Summary (Last 24 hours) at 09/16/2018 0859 Last data filed at 09/15/2018 2241 Gross per 24 hour  Intake  440 ml  Output 550 ml  Net -110 ml   Weight change: 3.4 kg  Physical Exam: Gen: Comfortably sitting up in bed, hugging pillow CVS: Pulse regular rhythm, normal rate, S1 and S2 normal Resp: Diminished breath sounds over bases-poor inspiratory effort Abd: Soft, moderately distended, tympanitic, bowel sounds normal Ext: 1+ pitting edema bilaterally,1-2+ presacral edema  Imaging: Dg Abd 1 View  Result Date: 09/14/2018 CLINICAL DATA:  Abdominal pain today. Recent open heart surgery. History of diabetes and hypertension. EXAM: ABDOMEN - 1 VIEW COMPARISON:  Abdominal radiographs 09/10/2018. FINDINGS: The bowel gas pattern is normal. There is no supine evidence of free intraperitoneal air. There are no suspicious abdominal calcifications. There are probable splenic artery calcifications. Left pleural effusion and retrocardiac left basilar airspace disease are similar to recent chest radiographs. No acute osseous findings are evident. IMPRESSION: No radiographic evidence of active abdominal process. Electronically Signed   By: Richardean Sale M.D.   On: 09/14/2018 13:51    Labs: BMET Recent Labs  Lab 09/10/18 1641 09/11/18 0826 09/12/18 0334 09/13/18 0319 09/14/18 0317 09/15/18 0354 09/15/18 0801 09/15/18 1055 09/15/18 1837 09/16/18 0224  NA 129* 129* 127* 128* 125* 123* 122* 122* 140 122*  K 5.3* 4.7 4.4 4.2 4.6 4.6 4.9  --   --  4.6  CL 101 100 100 97* 97* 95* 94*  --   --  95*  CO2 19* 19* 20* 20* 19* 20* 17*  --   --  16*  GLUCOSE 159* 131* 148* 98 140* 140* 154*  --   --  138*  BUN 51* 49* 48* 40* 37* 40* 39*  --   --  40*  CREATININE 2.63* 2.27* 2.24*  2.01* 2.24* 2.18* 2.05*  --   --  2.10*  CALCIUM 8.9 8.8* 8.4* 8.9 8.5* 8.4* 8.8*  --   --  8.5*  PHOS 4.8* 4.2  --   --   --   --   --   --   --  3.7   CBC Recent Labs  Lab 09/11/18 0825 09/12/18 0334 09/13/18 0319  WBC 10.0 10.0 9.5  NEUTROABS 7.9* 7.8*  --   HGB 8.8* 8.4* 9.2*  HCT 27.9* 26.9* 28.0*  MCV 86.1 84.6  84.8  PLT 308 345 340    Medications:    . amLODipine  10 mg Oral Daily  . aspirin EC  81 mg Oral Daily  . atorvastatin  80 mg Oral Daily  . budesonide  0.25 mg Inhalation BID  . carvedilol  25 mg Oral BID WC  . clopidogrel  75 mg Oral Daily  . docusate sodium  100 mg Oral BID  . enoxaparin (LOVENOX) injection  30 mg Subcutaneous QHS  . hydrALAZINE  50 mg Oral Q8H  . HYDROcodone-acetaminophen  2 tablet Oral Once  . Influenza vac split quadrivalent PF  0.5 mL Intramuscular Tomorrow-1000  . insulin aspart  0-24 Units Subcutaneous TID AC & HS  . insulin detemir  15 Units Subcutaneous BID  . metoCLOPramide (REGLAN) injection  5 mg Intravenous Q6H  . pantoprazole  40 mg Oral QAC breakfast  . pneumococcal 23 valent vaccine  0.5 mL Intramuscular Tomorrow-1000  . polyethylene glycol  17 g Oral Daily  . senna  1 tablet Oral BID  . simethicone  80 mg Oral QID  . sodium chloride flush  3 mL Intravenous Q12H  . tolvaptan  15 mg Oral Q24H   Elmarie Shiley, MD 09/16/2018, 8:59 AM

## 2018-09-16 NOTE — Progress Notes (Addendum)
Initial Nutrition Assessment  DOCUMENTATION CODES:   Obesity unspecified  INTERVENTION:    Change to regular vs bariatric Full Liquid diet to allow more volume at each meal  Ensure Enlive po TID, each supplement provides 350 kcal and 20 grams of protein   If PO intake does not improve, consider Cortrak 10 F tube placement  NUTRITION DIAGNOSIS:   Inadequate oral intake related to early satiety, nausea as evidenced by meal completion < 25%  GOAL:   Patient will meet greater than or equal to 90% of their needs  MONITOR:   PO intake, Supplement acceptance, Labs, Weight trends, Skin, I & O's  REASON FOR ASSESSMENT:   Consult Assessment of nutrition requirement/status  ASSESSMENT:   67 yo Female with PMH of CAD s/p PCI to RCA, LCx, and LAD (most recent one in 01/2018), HTN, uncontrolled T2DM, and HLD who presented with one month of productive cough and 1 week of atypical chest pain.   11/4 s/p CABG  RD spoke with patient's son. States pt's appetite is slowly picking up. She had some tomato soup this AM.  Pt does like Ensure Enlive supplements; drank a whole 8 oz bottle the other day. Son states lately pt has been feeling full and like "she's going to explode". Medications include Reglan, Colace and Lasix.  Labs reviewed. Na 122 (L). CBG's [phone].  NUTRITION - FOCUSED PHYSICAL EXAM:    Most Recent Value  Orbital Region  No depletion  Upper Arm Region  No depletion  Thoracic and Lumbar Region  No depletion  Buccal Region  No depletion  Temple Region  No depletion  Clavicle Bone Region  No depletion  Clavicle and Acromion Bone Region  No depletion  Scapular Bone Region  No depletion  Dorsal Hand  No depletion  Patellar Region  No depletion  Anterior Thigh Region  No depletion  Posterior Calf Region  No depletion  Edema (RD Assessment)  None     Diet Order:   Diet Order            Diet bariatric full liquid Room service appropriate? Yes; Fluid  consistency: Thin  Diet effective now             EDUCATION NEEDS:   No education needs have been identified at this time  Skin:  Skin Assessment: Reviewed RN Assessment  Last BM:  11/22  Height:   Ht Readings from Last 1 Encounters:  09/06/18 5\' 1"  (1.549 m)   Weight:   Wt Readings from Last 1 Encounters:  09/16/18 78.5 kg   BMI:  Body mass index is 32.7 kg/m.  Estimated Nutritional Needs:   Kcal:  1800-2000  Protein:  90-105 gm  Fluid:  1.8-2.0 L  Arthur Holms, RD, LDN Pager #: [phone] After-Hours Pager #: [phone]

## 2018-09-16 NOTE — Progress Notes (Addendum)
18 Days Post-Op Procedure(s) (LRB): CORONARY ARTERY BYPASS GRAFTING (CABG) x three, using left internal mammary artery and right leg greater saphenous vein harvested endoscopically (N/A) TRANSESOPHAGEAL ECHOCARDIOGRAM (TEE) (N/A) Subjective: Son and daughter-in-law in room with her to translate. Patient complains of abdominal bloating, shortness of breath with activity or lying down.  Objective: Vital signs in last 24 hours: Temp:  [97.4 F (36.3 C)-98.3 F (36.8 C)] 98.1 F (36.7 C) (11/22 0454) Pulse Rate:  [68-74] 74 (11/22 0909) Cardiac Rhythm: Normal sinus rhythm (11/22 0900) Resp:  [17-24] 17 (11/22 0909) BP: (131-163)/(53-65) 163/65 (11/22 0909) SpO2:  [94 %-95 %] 95 % (11/22 0454) Weight:  [78.5 kg] 78.5 kg (11/22 0500)  Hemodynamic parameters for last 24 hours:    Intake/Output from previous day: 11/21 0701 - 11/22 0700 In: 440 [P.O.:440] Out: 550 [Urine:550] Intake/Output this shift: Total I/O In: 1177.6 [I.V.:1060.6; IV Piggyback:117] Out: -   General appearance: fatigued Heart: regular rate and rhythm, S1, S2 normal, no murmur, click, rub or gallop Lungs: diminished breath sounds LLL that are tubular Abdomen: BS normal, soft, nontender, obese but not distended. Extremities: edema mild Wound: incisions ok  Lab Results: No results for input(s): WBC, HGB, HCT, PLT in the last 72 hours. BMET:  Recent Labs    09/15/18 0801  09/15/18 1837 09/16/18 0224  NA 122*   < > 140 122*  K 4.9  --   --  4.6  CL 94*  --   --  95*  CO2 17*  --   --  16*  GLUCOSE 154*  --   --  138*  BUN 39*  --   --  40*  CREATININE 2.05*  --   --  2.10*  CALCIUM 8.8*  --   --  8.5*   < > = values in this interval not displayed.    PT/INR: No results for input(s): LABPROT, INR in the last 72 hours. ABG    Component Value Date/Time   PHART 7.342 (L) 08/30/2018 0133   HCO3 23.3 08/30/2018 0133   TCO2 25 08/30/2018 1523   ACIDBASEDEF 2.0 08/30/2018 0133   O2SAT 75.1 08/31/2018  0400   CBG (last 3)  Recent Labs    09/15/18 1714 09/15/18 1946 09/16/18 0602  GLUCAP 214* 155* 128*    Assessment/Plan: S/P Procedure(s) (LRB): CORONARY ARTERY BYPASS GRAFTING (CABG) x three, using left internal mammary artery and right leg greater saphenous vein harvested endoscopically (N/A) TRANSESOPHAGEAL ECHOCARDIOGRAM (TEE) (N/A)  Hemodynamically stable in sinus rhythm. EF 50-55% by echo postop.  Shortness of breath: sats normal on room air. She may have an enlarging left pleural effusion on exam. Will check CXR and if increasing will need thoracentesis.  CKD: creat stable. Hyponatremia and metabolic acidosis. Nephrology is following and giving some bicarb. Has had two doses of tolvaptan but no rise in sodium yet.  DM: glucose under adequate control.   Abdominal bloating, poor appetite: GI following. Continue Reglan. Probably due to diabetic gastroparesis, constipation.     LOS: 25 days    Joy Boyd 09/16/2018   Addendum:  CXR reviewed. There is small left pleural effusion that is insignificant. There is some left basilar atelectasis. She needs to work on IS, deep breathing and ambulation.

## 2018-09-16 NOTE — Progress Notes (Addendum)
Patient ID: Joy Boyd, female   DOB: 25-Mar-1951, 67 y.o.   MRN: [id number] Blood sugars are better with changing Levemir dose.  Will continue same dosing for now.  No evidence of hypoglycemia.  GI following for her current GI issues.  Nephrology also following for renal failure as well as hyponatremia.  Rest of the management is per the primary team.  Hospitalist team will sign off.

## 2018-09-16 NOTE — Plan of Care (Signed)
  Problem: Health Behavior/Discharge Planning: Goal: Ability to manage health-related needs will improve Outcome: Progressing   Problem: Clinical Measurements: Goal: Ability to maintain clinical measurements within normal limits will improve Outcome: Progressing Goal: Diagnostic test results will improve Outcome: Progressing Goal: Cardiovascular complication will be avoided Outcome: Progressing   Problem: Activity: Goal: Risk for activity intolerance will decrease Outcome: Progressing   Problem: Nutrition: Goal: Adequate nutrition will be maintained Outcome: Progressing   Problem: Coping: Goal: Level of anxiety will decrease Outcome: Progressing   Problem: Elimination: Goal: Will not experience complications related to bowel motility Outcome: Progressing Goal: Will not experience complications related to urinary retention Outcome: Progressing   Problem: Pain Managment: Goal: General experience of comfort will improve Outcome: Progressing   Problem: Safety: Goal: Ability to remain free from injury will improve Outcome: Progressing   Problem: Skin Integrity: Goal: Risk for impaired skin integrity will decrease Outcome: Progressing   Problem: Education: Goal: Will demonstrate proper wound care and an understanding of methods to prevent future damage Outcome: Progressing Goal: Knowledge of disease or condition will improve Outcome: Progressing Goal: Knowledge of the prescribed therapeutic regimen will improve Outcome: Progressing Goal: Individualized Educational Video(s) Outcome: Progressing   Problem: Activity: Goal: Risk for activity intolerance will decrease Outcome: Progressing   Problem: Cardiac: Goal: Will achieve and/or maintain hemodynamic stability Outcome: Progressing   Problem: Clinical Measurements: Goal: Postoperative complications will be avoided or minimized Outcome: Progressing   Problem: Respiratory: Goal: Respiratory status will  improve Outcome: Progressing   Problem: Skin Integrity: Goal: Wound healing without signs and symptoms of infection Outcome: Progressing Goal: Risk for impaired skin integrity will decrease Outcome: Progressing   Problem: Urinary Elimination: Goal: Ability to achieve and maintain adequate renal perfusion and functioning will improve Outcome: Progressing

## 2018-09-16 NOTE — Progress Notes (Signed)
Patient refused activity including ambulation. RN educated the patient on the purpose and health benefits of ambulation post-cardiothoracic surgery.

## 2018-09-16 NOTE — Plan of Care (Signed)
  Problem: Health Behavior/Discharge Planning: °Goal: Ability to manage health-related needs will improve °Outcome: Not Progressing °  °Problem: Clinical Measurements: °Goal: Ability to maintain clinical measurements within normal limits will improve °Outcome: Not Progressing °Goal: Diagnostic test results will improve °Outcome: Not Progressing °  °Problem: Activity: °Goal: Risk for activity intolerance will decrease °Outcome: Not Progressing °  °

## 2018-09-16 NOTE — Progress Notes (Signed)
CARDIAC REHAB PHASE I   PRE:  Rate/Rhythm: 69 SR    BP: sitting 148/55    SaO2: 96 RA  MODE:  Ambulation: 200 ft   POST:  Rate/Rhythm: 75 SR with PVCs    BP: sitting 144/108, 152/115     SaO2: 92 RA  Pt feeling very poorly. She is pale. Sts her left upper abdomen is tight. She sts she feels her stomach is exploding. This all makes her significantly SOB. She sts her sx are 2/10 today. Yesterday with walking it was 10/10. She was willing to walk today. Used rollator. Increased SOB with walking. Had to sit at end of hall then able to come to chair after walk. BP elevated. She continues to look and feel poorly. Son is eager for GI to come. [id number]   [place], ACSM 09/16/2018 10:43 AM

## 2018-09-17 LAB — RENAL FUNCTION PANEL
ALBUMIN: 2.7 g/dL — AB (ref 3.5–5.0)
ANION GAP: 8 (ref 5–15)
BUN: 36 mg/dL — AB (ref 8–23)
CHLORIDE: 92 mmol/L — AB (ref 98–111)
CO2: 23 mmol/L (ref 22–32)
Calcium: 8.6 mg/dL — ABNORMAL LOW (ref 8.9–10.3)
Creatinine, Ser: 1.99 mg/dL — ABNORMAL HIGH (ref 0.44–1.00)
GFR calc Af Amer: 29 mL/min — ABNORMAL LOW (ref >60)
GFR, EST NON AFRICAN AMERICAN: 25 mL/min — AB (ref >60)
GLUCOSE: 78 mg/dL (ref 70–99)
PHOSPHORUS: 4.2 mg/dL (ref 2.5–4.6)
POTASSIUM: 3.9 mmol/L (ref 3.5–5.1)
Sodium: 123 mmol/L — ABNORMAL LOW (ref 135–145)

## 2018-09-17 LAB — GLUCOSE, CAPILLARY
GLUCOSE-CAPILLARY: 79 mg/dL (ref 70–99)
GLUCOSE-CAPILLARY: 152 mg/dL — AB (ref 70–99)
GLUCOSE-CAPILLARY: 98 mg/dL (ref 70–99)
Glucose-Capillary: 148 mg/dL — ABNORMAL HIGH (ref 70–99)
Glucose-Capillary: 115 mg/dL — ABNORMAL HIGH (ref 70–99)
Glucose-Capillary: 155 mg/dL — ABNORMAL HIGH (ref 70–99)

## 2018-09-17 MED ORDER — SIMETHICONE 80 MG PO CHEW
80.0000 mg | CHEWABLE_TABLET | Freq: Four times a day (QID) | ORAL | Status: DC
  Administered 2018-09-17 – 2018-09-18 (×8): 80 mg via ORAL
  Filled 2018-09-17 (×8): qty 1

## 2018-09-17 MED ORDER — FLEET ENEMA 7-19 GM/118ML RE ENEM: 1.0000 | ENEMA | Freq: Every day | RECTAL | Status: DC | PRN

## 2018-09-17 MED ORDER — FUROSEMIDE 10 MG/ML IJ SOLN
80.0000 mg | Freq: Once | INTRAMUSCULAR | Status: AC
  Administered 2018-09-17: 80 mg via INTRAVENOUS
  Filled 2018-09-17: qty 8

## 2018-09-17 NOTE — Plan of Care (Signed)
  Problem: Health Behavior/Discharge Planning: Goal: Ability to manage health-related needs will improve Outcome: Progressing   Problem: Clinical Measurements: Goal: Ability to maintain clinical measurements within normal limits will improve Outcome: Progressing Goal: Diagnostic test results will improve Outcome: Progressing   Problem: Activity: Goal: Risk for activity intolerance will decrease Outcome: Progressing

## 2018-09-17 NOTE — Progress Notes (Signed)
Patient's daughter is at the bedside and is able to assist with interviewing the patient.  The patient is doing somewhat better.  Her spirits are better, she has been having bowel movements, and she is able to lie down and breathe more readily, as compared to several days ago when the abdominal distention was such that it made it difficult for her to breathe unless she was sitting up.  She is also tolerating small sips of liquids, as compared to the regurgitation she was having recently.  She is now on metoclopramide.  On exam, bowel sounds are present and the abdomen is only mildly distended.  Impression: Resolving ileus, probably multifactorial including medication, underlying diabetes, and most importantly, recent surgery.  Plan: Continue current management.  I do not feel she is ready for dietary advancement to solid food yet.  Joy Boyd, M.D. Pager [phone] If no answer or after 5 PM call [phone]

## 2018-09-17 NOTE — Progress Notes (Addendum)
Patient ID: Joy Boyd, female   DOB: 31-Jan-1951, 67 y.o.   MRN: [id number] [facility] Progress Note   Assessment/ Plan:   1.  Acute kidney injury on chronic kidney disease stage III versus progression: I suspect that this is largely hemodynamically mediated following coronary artery bypass grafting and possibly complicated by intermittent diuretic use/management of volume overload which is imperative to try and improve her respiratory status. Renal function stable overnight with fair UOP- re-order lasix bolus. 2.  Hyponatremia: She is clinically hypervolemic on physical examination and this plausibly may be from her worsening renal function/CHF type symptoms with impaired water handling and activation of ADH excess. With unchanged sodium level in spite of tolvaptan-3rd and final dose today- if sodium no better, will give 3% saline. 3.  Abdominal distention/discomfort: Improving with scheduled metoclopramide and simethicone-encouraged ambulation. 4.  Coronary artery disease status post CABG: Postop day 19 -sluggish progress due to intercurrent renal insufficiency/hyponatremia and abdominal symptoms/DOE. 5.  Anemia: Likely anemia of chronic kidney disease and with significant iron deficiency, started intravenous Feraheme. 6.  Hypertension: Appears to be borderline elevated, likely secondary to her abdominal discomfort-we will follow trend to decide on need to adjust therapy.  Subjective:   Reports decreased abdominal distension and shortness of breath. Reports improving tolerance to exertion.   Objective:   BP 139/63 (BP Location: Right Arm)   Pulse 73   Temp 98.5 F (36.9 C) (Oral)   Resp 17   Ht 5\' 1"  (1.549 m)   Wt 78.5 kg   SpO2 93%   BMI 32.70 kg/m   Intake/Output Summary (Last 24 hours) at 09/17/2018 1011 Last data filed at 09/17/2018 7048 Gross per 24 hour  Intake 1519.07 ml  Output 1500 ml  Net 19.07 ml   Weight change:   Physical Exam: Gen: Comfortably  resting in bed CVS: Pulse regular rhythm, normal rate, S1 and S2 normal Resp: Diminished breath sounds over bases-poor inspiratory effort Abd: Soft, moderately distended, tympanitic, bowel sounds normal Ext: 1+ pitting edema bilaterally,1-2+ presacral edema  Imaging: Dg Chest 2 View  Result Date: 09/16/2018 CLINICAL DATA:  CABG. EXAM: CHEST - 2 VIEW COMPARISON:  09/08/2018 and prior chest radiographs FINDINGS: Cardiomegaly and CABG changes again noted. LEFT LOWER lung consolidation/atelectasis and small bilateral pleural effusions are again noted. There is no evidence of pulmonary edema or pneumothorax. No acute bony abnormalities noted. IMPRESSION: Little significant change with cardiomegaly, LEFT LOWER lung consolidation/atelectasis and small effusions. Electronically Signed   By: Margarette Canada M.D.   On: 09/16/2018 13:50    Labs: BMET Recent Labs  Lab 09/10/18 1641 09/11/18 0826  09/14/18 0317 09/15/18 0354 09/15/18 0801 09/15/18 1055 09/15/18 1837 09/16/18 0224 09/16/18 1138 09/16/18 1924 09/17/18 0540  NA 129* 129*   < > 125* 123* 122* 122* 140 122* 122* 122* 123*  K 5.3* 4.7   < > 4.6 4.6 4.9  --   --  4.6 4.6 4.3 3.9  CL 101 100   < > 97* 95* 94*  --   --  95* 95* 92* 92*  CO2 19* 19*   < > 19* 20* 17*  --   --  16* 19* 20* 23  GLUCOSE 159* 131*   < > 140* 140* 154*  --   --  138* 143* 134* 78  BUN 51* 49*   < > 37* 40* 39*  --   --  40* 39* 41* 36*  CREATININE 2.63* 2.27*   < > 2.24* 2.18*  2.05*  --   --  2.10* 2.12* 2.04* 1.99*  CALCIUM 8.9 8.8*   < > 8.5* 8.4* 8.8*  --   --  8.5* 8.9 8.7* 8.6*  PHOS 4.8* 4.2  --   --   --   --   --   --  3.7  --   --  4.2   < > = values in this interval not displayed.   CBC Recent Labs  Lab 09/11/18 0825 09/12/18 0334 09/13/18 0319  WBC 10.0 10.0 9.5  NEUTROABS 7.9* 7.8*  --   HGB 8.8* 8.4* 9.2*  HCT 27.9* 26.9* 28.0*  MCV 86.1 84.6 84.8  PLT 308 345 340    Medications:    . amLODipine  10 mg Oral Daily  . aspirin EC   81 mg Oral Daily  . atorvastatin  80 mg Oral Daily  . budesonide  0.25 mg Inhalation BID  . carvedilol  25 mg Oral BID WC  . clopidogrel  75 mg Oral Daily  . docusate sodium  100 mg Oral BID  . enoxaparin (LOVENOX) injection  30 mg Subcutaneous QHS  . feeding supplement (ENSURE ENLIVE)  237 mL Oral TID BM  . hydrALAZINE  50 mg Oral Q8H  . HYDROcodone-acetaminophen  2 tablet Oral Once  . insulin aspart  0-24 Units Subcutaneous TID AC & HS  . insulin detemir  15 Units Subcutaneous BID  . metoCLOPramide (REGLAN) injection  5 mg Intravenous Q6H  . pantoprazole  40 mg Oral QAC breakfast  . polyethylene glycol  17 g Oral Daily  . senna  1 tablet Oral BID  . simethicone  80 mg Oral QID  . sodium chloride flush  3 mL Intravenous Q12H   Elmarie Shiley, MD 09/17/2018, 10:11 AM

## 2018-09-17 NOTE — Progress Notes (Addendum)
      [address]       [place] [address]             [phone]      19 Days Post-Op Procedure(s) (LRB): CORONARY ARTERY BYPASS GRAFTING (CABG) x three, using left internal mammary artery and right leg greater saphenous vein harvested endoscopically (N/A) TRANSESOPHAGEAL ECHOCARDIOGRAM (TEE) (N/A)   Subjective:  No new complaints.  Son at bedside to provide translation, states she feels a little bit better.  She has ambulated and was able to have BM yesterday.  Still having issues with gas with poor oral intake  Objective: Vital signs in last 24 hours: Temp:  [97.8 F (36.6 C)-98.5 F (36.9 C)] 98.2 F (36.8 C) (11/23 0516) Pulse Rate:  [68-74] 72 (11/23 0516) Cardiac Rhythm: Normal sinus rhythm (11/23 0700) Resp:  [17-26] 26 (11/23 0516) BP: (107-163)/(53-73) 152/53 (11/23 0516) SpO2:  [91 %-94 %] 91 % (11/23 0516)  Intake/Output from previous day: 11/22 0701 - 11/23 0700 In: 1519.1 [I.V.:1402.1; IV Piggyback:117] Out: 1500 [Urine:1500]  General appearance: alert, cooperative and no distress Heart: regular rate and rhythm Lungs: diminished breath sounds left base Abdomen: soft, mildly distended, good BS x 4 Extremities: edema trace Wound: clean and dry  Lab Results: No results for input(s): WBC, HGB, HCT, PLT in the last 72 hours. BMET:  Recent Labs    09/16/18 1924 09/17/18 0540  NA 122* 123*  K 4.3 3.9  CL 92* 92*  CO2 20* 23  GLUCOSE 134* 78  BUN 41* 36*  CREATININE 2.04* 1.99*  CALCIUM 8.7* 8.6*    PT/INR: No results for input(s): LABPROT, INR in the last 72 hours. ABG    Component Value Date/Time   PHART 7.342 (L) 08/30/2018 0133   HCO3 23.3 08/30/2018 0133   TCO2 25 08/30/2018 1523   ACIDBASEDEF 2.0 08/30/2018 0133   O2SAT 75.1 08/31/2018 0400   CBG (last 3)  Recent Labs    09/16/18 1637 09/16/18 2109 09/17/18 0558  GLUCAP 152* 143* 79    Assessment/Plan: S/P Procedure(s) (LRB): CORONARY ARTERY BYPASS GRAFTING  (CABG) x three, using left internal mammary artery and right leg greater saphenous vein harvested endoscopically (N/A) TRANSESOPHAGEAL ECHOCARDIOGRAM (TEE) (N/A)  1. CV- hemodynamically stable, BP in the 150s, but okay with renal insufficiency- continue Norvasc, Coreg, Hydralazine 2. Pulm- no acute issues, off oxygen, shortness of breath improved 3. Renal- creatinine stable at 1.99, Hyponatremia on Tolvaptan, care per nephrology 4. GI- no evidence of ileus on ABD film, continue reglan, added Simethicone, Fleets enema prn, care per GI 5. DM- sugars are controlled, continue current regimen 6. Dispo- patient hemodynamically stable, renal issues per nephrology, GI following abdomen, added Simethicone, Enema prn, continue ambulation, continue current care   LOS: 26 days    Erin Barrett 09/17/2018  I have seen and examined the patient and agree with the assessment and plan as outlined.  Very slow progress.  Constipation may be contributing.    Rexene Alberts, MD 09/17/2018 11:52 AM

## 2018-09-17 NOTE — Progress Notes (Signed)
CARDIAC REHAB PHASE I   PRE:  Rate/Rhythm: 68 SR    BP: sitting 130/57    SaO2: 92 RA  MODE:  Ambulation: 160 ft   POST:  Rate/Rhythm: 72 SR    BP: sitting 134/73     SaO2: 92 RA  Pt continues to feel poorly. She is exhausted and wheezing from going to bathroom. Rested then walked in hall with rollator, only 160 ft. Wheezing after walk. To recliner. Encouraged more walking with her and husband. Also encouraged IS, eating, and bowel regimen.  [place], ACSM 09/17/2018 2:05 PM

## 2018-09-18 ENCOUNTER — Inpatient Hospital Stay (HOSPITAL_COMMUNITY): Payer: Medicaid Other

## 2018-09-18 LAB — RENAL FUNCTION PANEL
ANION GAP: 11 (ref 5–15)
Albumin: 2.8 g/dL — ABNORMAL LOW (ref 3.5–5.0)
BUN: 35 mg/dL — ABNORMAL HIGH (ref 8–23)
CHLORIDE: 93 mmol/L — AB (ref 98–111)
CO2: 23 mmol/L (ref 22–32)
Calcium: 8.7 mg/dL — ABNORMAL LOW (ref 8.9–10.3)
Creatinine, Ser: 2.13 mg/dL — ABNORMAL HIGH (ref 0.44–1.00)
GFR calc non Af Amer: 23 mL/min — ABNORMAL LOW (ref >60)
GFR, EST AFRICAN AMERICAN: 27 mL/min — AB (ref >60)
GLUCOSE: 99 mg/dL (ref 70–99)
Phosphorus: 4.2 mg/dL (ref 2.5–4.6)
Potassium: 3.7 mmol/L (ref 3.5–5.1)
Sodium: 127 mmol/L — ABNORMAL LOW (ref 135–145)

## 2018-09-18 LAB — GLUCOSE, CAPILLARY
GLUCOSE-CAPILLARY: 135 mg/dL — AB (ref 70–99)
GLUCOSE-CAPILLARY: 107 mg/dL — AB (ref 70–99)
GLUCOSE-CAPILLARY: 194 mg/dL — AB (ref 70–99)
Glucose-Capillary: 147 mg/dL — ABNORMAL HIGH (ref 70–99)
Glucose-Capillary: 95 mg/dL (ref 70–99)

## 2018-09-18 MED ORDER — FUROSEMIDE 10 MG/ML IJ SOLN
80.0000 mg | Freq: Once | INTRAMUSCULAR | Status: AC
  Administered 2018-09-18: 80 mg via INTRAVENOUS
  Filled 2018-09-18: qty 8

## 2018-09-18 MED ORDER — FUROSEMIDE 10 MG/ML IJ SOLN: 80.0000 mg | Freq: Once | INTRAMUSCULAR | Status: DC

## 2018-09-18 MED ORDER — LACTULOSE 10 GM/15ML PO SOLN
10.0000 g | Freq: Once | ORAL | Status: AC
  Administered 2018-09-18: 10 g via ORAL
  Filled 2018-09-18: qty 15

## 2018-09-18 MED ORDER — ALBUMIN HUMAN 25 % IV SOLN
25.0000 g | Freq: Once | INTRAVENOUS | Status: AC
  Administered 2018-09-18: 25 g via INTRAVENOUS
  Filled 2018-09-18: qty 100

## 2018-09-18 NOTE — Progress Notes (Addendum)
      [address]       [place] [address]             [phone]      20 Days Post-Op Procedure(s) (LRB): CORONARY ARTERY BYPASS GRAFTING (CABG) x three, using left internal mammary artery and right leg greater saphenous vein harvested endoscopically (N/A) TRANSESOPHAGEAL ECHOCARDIOGRAM (TEE) (N/A)   Subjective:  No new complaints. She continues to complain of pain along her left upper quadrant.  This has not improved. She continues to have gas.  Having minimal small BM.  + ambulation  Son at bedside provides translation.  Objective: Vital signs in last 24 hours: Temp:  [98 F (36.7 C)-98.6 F (37 C)] 98.2 F (36.8 C) (11/24 0419) Pulse Rate:  [66-87] 66 (11/23 2116) Cardiac Rhythm: Normal sinus rhythm (11/24 0700) Resp:  [15-21] 15 (11/24 0419) BP: (133-157)/(46-63) 133/46 (11/24 0419) SpO2:  [92 %-98 %] 98 % (11/24 0831) Weight:  [73.3 kg] 73.3 kg (11/24 0500)  General appearance: alert, cooperative and no distress Heart: regular rate and rhythm Lungs: clear to auscultation bilaterally Abdomen: soft, more distended today, BS present, but not as active as yesterday Extremities: edema trace Wound: clean and dry  Lab Results: No results for input(s): WBC, HGB, HCT, PLT in the last 72 hours. BMET:  Recent Labs    09/17/18 0540 09/18/18 0255  NA 123* 127*  K 3.9 3.7  CL 92* 93*  CO2 23 23  GLUCOSE 78 99  BUN 36* 35*  CREATININE 1.99* 2.13*  CALCIUM 8.6* 8.7*    PT/INR: No results for input(s): LABPROT, INR in the last 72 hours. ABG    Component Value Date/Time   PHART 7.342 (L) 08/30/2018 0133   HCO3 23.3 08/30/2018 0133   TCO2 25 08/30/2018 1523   ACIDBASEDEF 2.0 08/30/2018 0133   O2SAT 75.1 08/31/2018 0400   CBG (last 3)  Recent Labs    09/17/18 2126 09/17/18 2329 09/18/18 0615  GLUCAP 98 155* 135*    Assessment/Plan: S/P Procedure(s) (LRB): CORONARY ARTERY BYPASS GRAFTING (CABG) x three, using left internal mammary artery  and right leg greater saphenous vein harvested endoscopically (N/A) TRANSESOPHAGEAL ECHOCARDIOGRAM (TEE) (N/A)  1. CV- hemodynamically stable- continue Norvasc, Coreg, Hydralazine 2. Pulm- no acute issues, persistent shortness of breath with ambulation, CXR has been okay, likely due to deconditioning 3. Renal- creatinine at 2.13, remains hyponatremic- care per nephrology 4. GI- patient with minimal BM, on reglan for suspected gastroparesis, refused enema yesterday, will give Lactulose today, get repeat ABD film as patient continues to have persistent LUQ pain/Gas 5. DM- sugars controlled 6. Deconditioning- patient has been here 20 days, she is ambulating, but will have PT work with her to help with physical deconditioning 7. Dispo- patient with persistent abdominal issues will get repeat ABD, lactulose today, remains hyponatremic, creatinine back up to 2.13 nephrology following, continue current care   LOS: 27 days    Joy Boyd 09/18/2018   I have seen and examined the patient and agree with the assessment and plan as outlined.  Joy Alberts, MD 09/18/2018 10:23 AM

## 2018-09-18 NOTE — Evaluation (Signed)
Physical Therapy Evaluation & Discharge Patient Details Name: Joy Boyd MRN: [id number] DOB: 23-Feb-1951 Today's Date: 09/18/2018   History of Present Illness  Pt is a 67 y.o. female visiting from [place] admitted 08/20/18 with worsening dyspnea, cough and chest pain. S/p CABGx3 11/4. Hospital course complicated by LUQ pain, gastroparesis; resolving ileus, likely multifactorial from medication, DM, recent sx. PMH includes HTN, DM2, CAD, CHF, PVC.    Clinical Impression  Patient evaluated by Physical Therapy with no further acute PT needs identified. Pt currently ambulating with rollator at supervision-level; good ability to maintain sternal precautions. Declining further mobility secondary to fatigue. Husband present and educated on importance of frequent OOB mobility with supervision from family and/or nursing staff. Pt has been ambulating with nursing, family, and cardiac rehab. All education has been completed and the patient has no further questions. PT is signing off. Thank you for this referral.    Follow Up Recommendations No PT follow up;Supervision - Intermittent    Equipment Recommendations  Other (comment)(rollator)    Recommendations for Other Services       Precautions / Restrictions Precautions Precautions: Sternal      Mobility  Bed Mobility Overal bed mobility: Modified Independent                Transfers Overall transfer level: Needs assistance Equipment used: None Transfers: Sit to/from Stand Sit to Stand: Supervision         General transfer comment: Pt able to rise without UE support, bear hugging heart pillow; supervision for safety.  Ambulation/Gait Ambulation/Gait assistance: Supervision Gait Distance (Feet): 120 Feet Assistive device: 4-wheeled walker Gait Pattern/deviations: Step-through pattern;Decreased stride length Gait velocity: Decreased Gait velocity interpretation: 1.31 - 2.62 ft/sec, indicative of limited community  ambulator General Gait Details: Required cues for locking/unlocking rollator. Supervision for safety. Declining further distance secondary to fatigue  Stairs Stairs: (Declined)          Wheelchair Mobility    Modified Rankin (Stroke Patients Only)       Balance Overall balance assessment: Needs assistance   Sitting balance-Leahy Scale: Good       Standing balance-Leahy Scale: Fair Standing balance comment: Can static stand and take steps without UE support                             Pertinent Vitals/Pain Pain Assessment: Faces Faces Pain Scale: Hurts a little bit Pain Location: Sternal incision Pain Descriptors / Indicators: Guarding Pain Intervention(s): Monitored during session    Home Living Family/patient expects to be discharged to:: Private residence Living Arrangements: Spouse/significant other;Children Available Help at Discharge: Family;Available 24 hours/day Type of Home: Apartment Home Access: Stairs to enter Entrance Stairs-Rails: Right Entrance Stairs-Number of Steps: 12-14 Home Layout: One level Home Equipment: None Additional Comments: Pt lives in [place] with husband; visiting daughter in [place]. through December    Prior Function Level of Independence: Independent         Comments: Limited endurance     Hand Dominance        Extremity/Trunk Assessment   Upper Extremity Assessment Upper Extremity Assessment: Generalized weakness    Lower Extremity Assessment Lower Extremity Assessment: Generalized weakness       Communication   Communication: Prefers language other than English  Cognition Arousal/Alertness: Awake/alert Behavior During Therapy: Flat affect Overall Cognitive Status: Within Functional Limits for tasks assessed  General Comments: WFL for basic tasks; husband translating      General Comments General comments (skin integrity, edema, etc.): Husband  present and supportive. Educ on importance of frequent hallway ambulation (>3x/day ideal)    Exercises     Assessment/Plan    PT Assessment Patent does not need any further PT services  PT Problem List         PT Treatment Interventions      PT Goals (Current goals can be found in the Care Plan section)  Acute Rehab PT Goals PT Goal Formulation: All assessment and education complete, DC therapy    Frequency     Barriers to discharge        Co-evaluation               AM-PAC PT "6 Clicks" Mobility  Outcome Measure Help needed turning from your back to your side while in a flat bed without using bedrails?: None Help needed moving from lying on your back to sitting on the side of a flat bed without using bedrails?: None Help needed moving to and from a bed to a chair (including a wheelchair)?: None Help needed standing up from a chair using your arms (e.g., wheelchair or bedside chair)?: A Little Help needed to walk in hospital room?: A Little Help needed climbing 3-5 steps with a railing? : A Little 6 Click Score: 21    End of Session   Activity Tolerance: Patient limited by fatigue Patient left: in bed;with call bell/phone within reach;with family/visitor present(sitting EOB) Nurse Communication: Mobility status PT Visit Diagnosis: Other abnormalities of gait and mobility (R26.89)    Time: 8403-7543 PT Time Calculation (min) (ACUTE ONLY): 12 min   Charges:   PT Evaluation $PT Eval Moderate Complexity: [place], PT, DPT Acute Rehabilitation Services  Pager [phone] Office [place] 09/18/2018, 3:20 PM

## 2018-09-18 NOTE — Progress Notes (Signed)
Patient ID: Joy Boyd, female   DOB: 03-31-1951, 67 y.o.   MRN: [id number] [facility] Progress Note   Assessment/ Plan:   1.  Acute kidney injury on chronic kidney disease stage III versus progression: I suspect that this is largely hemodynamically mediated following coronary artery bypass grafting and possibly complicated by intermittent diuretic use/management of volume overload-a mild rise of her creatinine is noted today which indeed might be hemodynamically mediated in the setting of intermittent furosemide use. 2.  Hyponatremia:  Hypervolemic with features of inappropriate ADH activation, some improvement with tolvaptan for the past 3 days.  Start fluid restriction and will reorder furosemide bolus. 3.  Abdominal distention/discomfort: Continues to show clinical improvement, plans noted for lactulose today. 4.  Coronary artery disease status post CABG: Postop day 20 -progress limited by renal insufficiency, hyponatremia and abdominal distention. 5.  Anemia: Likely anemia of chronic kidney disease and with significant iron deficiency, started intravenous Feraheme. 6.  Hypertension: Appears to be borderline elevated, likely secondary to her abdominal discomfort-we will follow trend to decide on need to adjust therapy.  Subjective:   Reports some improvement in abdominal distention/discomfort and improving energy level.   Objective:   BP (!) 133/46 (BP Location: Left Arm)   Pulse 66   Temp 98.2 F (36.8 C) (Oral)   Resp 15   Ht 5\' 1"  (1.549 m)   Wt 73.3 kg   SpO2 98%   BMI 30.52 kg/m  No intake or output data in the 24 hours ending 09/18/18 0849 Weight change:   Physical Exam: Gen: Comfortably resting in bed CVS: Pulse regular rhythm, normal rate, S1 and S2 normal Resp: Improving breath sounds bilaterally, no distinct rales Abd: Soft, moderately distended, tympanitic, bowel sounds normal Ext: 3+ right lower extremity edema, 2+ left lower extremity  edema  Imaging: Dg Chest 2 View  Result Date: 09/16/2018 CLINICAL DATA:  CABG. EXAM: CHEST - 2 VIEW COMPARISON:  09/08/2018 and prior chest radiographs FINDINGS: Cardiomegaly and CABG changes again noted. LEFT LOWER lung consolidation/atelectasis and small bilateral pleural effusions are again noted. There is no evidence of pulmonary edema or pneumothorax. No acute bony abnormalities noted. IMPRESSION: Little significant change with cardiomegaly, LEFT LOWER lung consolidation/atelectasis and small effusions. Electronically Signed   By: Margarette Canada M.D.   On: 09/16/2018 13:50    Labs: BMET Recent Labs  Lab 09/15/18 0354 09/15/18 0801 09/15/18 1055 09/15/18 1837 09/16/18 0224 09/16/18 1138 09/16/18 1924 09/17/18 0540 09/18/18 0255  NA 123* 122* 122* 140 122* 122* 122* 123* 127*  K 4.6 4.9  --   --  4.6 4.6 4.3 3.9 3.7  CL 95* 94*  --   --  95* 95* 92* 92* 93*  CO2 20* 17*  --   --  16* 19* 20* 23 23  GLUCOSE 140* 154*  --   --  138* 143* 134* 78 99  BUN 40* 39*  --   --  40* 39* 41* 36* 35*  CREATININE 2.18* 2.05*  --   --  2.10* 2.12* 2.04* 1.99* 2.13*  CALCIUM 8.4* 8.8*  --   --  8.5* 8.9 8.7* 8.6* 8.7*  PHOS  --   --   --   --  3.7  --   --  4.2 4.2   CBC Recent Labs  Lab 09/12/18 0334 09/13/18 0319  WBC 10.0 9.5  NEUTROABS 7.8*  --   HGB 8.4* 9.2*  HCT 26.9* 28.0*  MCV 84.6 84.8  PLT 345  340    Medications:    . amLODipine  10 mg Oral Daily  . aspirin EC  81 mg Oral Daily  . atorvastatin  80 mg Oral Daily  . budesonide  0.25 mg Inhalation BID  . carvedilol  25 mg Oral BID WC  . clopidogrel  75 mg Oral Daily  . docusate sodium  100 mg Oral BID  . enoxaparin (LOVENOX) injection  30 mg Subcutaneous QHS  . feeding supplement (ENSURE ENLIVE)  237 mL Oral TID BM  . hydrALAZINE  50 mg Oral Q8H  . HYDROcodone-acetaminophen  2 tablet Oral Once  . insulin aspart  0-24 Units Subcutaneous TID AC & HS  . insulin detemir  15 Units Subcutaneous BID  . lactulose  10 g  Oral Once  . metoCLOPramide (REGLAN) injection  5 mg Intravenous Q6H  . pantoprazole  40 mg Oral QAC breakfast  . polyethylene glycol  17 g Oral Daily  . senna  1 tablet Oral BID  . simethicone  80 mg Oral QID  . sodium chloride flush  3 mL Intravenous Q12H   Elmarie Shiley, MD 09/18/2018, 8:49 AM

## 2018-09-18 NOTE — Progress Notes (Signed)
Patient walked for about 270 ft and tolerated it well with a walker.

## 2018-09-18 NOTE — Progress Notes (Signed)
Patient walked the second time on this shift for 320 ft. Tolerated it well.

## 2018-09-18 NOTE — Progress Notes (Deleted)
Cardiology Office Note   Date:  09/18/2018   ID:  Joy Boyd, Joy Boyd [birth year], MRN [id number]  PCP:  Patient, No Pcp Per  Cardiologist:  Kathyrn Drown, NP   No chief complaint on file.  History of Present Illness: Joy Boyd is a 67 y.o. female who presents for hospital follow up, seen for Dr. Acie Fredrickson. Joy Boyd has a hx of hypertension, GERD,  uncontrolled DM 2, multivessel CAD with most recent stents to RCA, left circumflex and LAD 01/2018 (approximately 6 stents total) who was seen in consultation 08/21/2018 for intermittent atypical chest pain with radiation to the left arm for one month prior to hospital presentation. Chest pain was noted to be worse with lying flat and with coughing, was nonexertional and had pleuritic components. Troponin was negative and EKG showed NSR with LBBB with no prior tracing for comparison. A CTA was performed which was negative for PE. CXR with multifocal coronary artery calcification with aortic atherosclerosis and no edema or consolidation.  Echocardiogram performed 08/21/2018 with LVEF of 15 to 20% (previously 45%) with diffuse hypokinesis and G1 DD.  She was scheduled for Myoview stress test on 08/22/2018 which showed suspected areas of pharmacologically induced ischemia involving the lateral wall of the left ventricle and septum with severe left ventricular global hypokinesis with relative akinesis EF of the septum with estimated LVEF of 24%. Given this, she underwent an LHC on 08/23/2017 which showed severe distal left main disease involving the ostium of both LAD and circumflex arteries.  She had mild in-stent restenosis in the LAD, circumflex and RCA stents with moderate disease beyond the stent in the LAD.  TCTS was consulted for possible surgical intervention, however is concerned given her reduced EF as this may not provide enough benefit for the risks.  Given an unprotected left main PCI, Impella support was initiated further decision made. Her Plavix  was held and ASA was continued. She underwent a coronary bypass grafting x3 on 08/29/2018. She tolerated the procedure well and was transferred to the surgical ICU and was extubated in a timely manner. Her ACE inhibitor was held due to chronic kidney disease and a diuretic regimen was started for fluid management with Lasix and metolazone. ASA 81 mg and Plavix 75 mg was restarted post-op due to multiple stents. She had some issues with constipation during her hospital course, however this slowly improved after initiation of anti-medics and a gastroparesis diet (negative ileus ). he also had issues with bigeminy on telemetry in which was she was started on carvedilol. She had a repeat echocardiogram for further evaluation showed  Nephrology consulted 09/15/2018 secondary to AKI with chronic kidney disease stage III versus progression.  A renal ultrasound that was negative and it was thought that she was mildly intravascular volume depletion.  She did have some expected acute postoperative blood loss anemia which she was treated with 1 unit packed red blood cells in the OR.  Her hemoglobin continued to trend down, therefore we added another unit.     The patient lives in [place] with her husband but travels frequently to the [place] to visit her son. She has been here in the past month or so and was planning on returning to [place] about a month. She does not speak any English but her son who lives here speaks fluent [place] and acts as a Joy Boyd. He is with her today.   Past Medical History:  Diagnosis Date  . Acute on chronic combined systolic and  diastolic CHF (congestive heart failure) ([facility]) 08/23/2018  . Coronary artery disease   . Diabetes mellitus type 2 in obese ([facility]) 08/23/2018  . Diabetes mellitus without complication ([facility])   . Hypertension   . Pure hypercholesterolemia 08/23/2018  . PVC (premature ventricular contraction) 08/23/2018    Past Surgical History:  Procedure  Laterality Date  . CORONARY ARTERY BYPASS GRAFT N/A 08/29/2018   Procedure: CORONARY ARTERY BYPASS GRAFTING (CABG) x three, using left internal mammary artery and right leg greater saphenous vein harvested endoscopically;  Surgeon: Gaye Pollack, MD;  Location: [facility];  Service: Open Heart Surgery;  Laterality: N/A;  . LEFT HEART CATH AND CORONARY ANGIOGRAPHY N/A 08/23/2018   Procedure: LEFT HEART CATH AND CORONARY ANGIOGRAPHY;  Surgeon: Leonie Man, MD;  Location: [facility];  Service: Cardiovascular;  Laterality: N/A;  . RIGHT HEART CATH N/A 08/25/2018   Procedure: RIGHT HEART CATH;  Surgeon: Belva Crome, MD;  Location: [facility];  Service: Cardiovascular;  Laterality: N/A;  . TEE WITHOUT CARDIOVERSION N/A 08/29/2018   Procedure: TRANSESOPHAGEAL ECHOCARDIOGRAM (TEE);  Surgeon: Gaye Pollack, MD;  Location: [facility];  Service: Open Heart Surgery;  Laterality: N/A;     No current facility-administered medications for this visit.    Current Outpatient Medications  Medication Sig Dispense Refill  . acetaminophen (TYLENOL) 325 MG tablet Take 2 tablets (650 mg total) by mouth every 6 (six) hours as needed for mild pain.    Marland Kitchen amLODipine (NORVASC) 10 MG tablet Take 1 tablet (10 mg total) by mouth daily. 60 tablet 1  . aspirin EC 81 MG EC tablet Take 1 tablet (81 mg total) by mouth daily. 30 tablet 1  . atorvastatin (LIPITOR) 80 MG tablet Take 1 tablet (80 mg total) by mouth daily. 60 tablet 1  . bisacodyl (DULCOLAX) 10 MG suppository Place 1 suppository (10 mg total) rectally daily as needed for moderate constipation. 12 suppository 0  . carvedilol (COREG) 25 MG tablet Take 1 tablet (25 mg total) by mouth 2 (two) times daily with a meal. 120 tablet 1  . docusate sodium (COLACE) 100 MG capsule Take 1 capsule (100 mg total) by mouth 2 (two) times daily. 10 capsule 0  . hydrALAZINE (APRESOLINE) 25 MG tablet Take 1 tablet (25 mg total) by mouth every 8 (eight) hours. 180 tablet 1   . insulin aspart protamine- aspart (NOVOLOG MIX 70/30) (70-30) 100 UNIT/ML injection Inject 0.15 mLs (15 Units total) into the skin 2 (two) times daily with a meal. 10 mL 11  . senna (SENOKOT) 8.6 MG TABS tablet Take 1 tablet (8.6 mg total) by mouth 2 (two) times daily. 120 each 0  . traMADol (ULTRAM) 50 MG tablet Take 1 tablet (50 mg total) by mouth every 12 (twelve) hours as needed for moderate pain. 30 tablet 0   Facility-Administered Medications Ordered in Other Visits  Medication Dose Route Frequency Provider Last Rate Last Dose  . acetaminophen (TYLENOL) tablet 650 mg  650 mg Oral Q6H PRN Gaye Pollack, MD   650 mg at 09/03/18 1741  . amLODipine (NORVASC) tablet 10 mg  10 mg Oral Daily Barrett, Erin R, PA-C   10 mg at 09/18/18 0831  . aspirin EC tablet 81 mg  81 mg Oral Daily Gaye Pollack, MD   81 mg at 09/18/18 0831  . atorvastatin (LIPITOR) tablet 80 mg  80 mg Oral Daily Gaye Pollack, MD   80 mg at 09/18/18 0831  .  bisacodyl (DULCOLAX) suppository 10 mg  10 mg Rectal Daily PRN Janora Norlander, MD   10 mg at 09/10/18 1809  . budesonide (PULMICORT) nebulizer solution 0.25 mg  0.25 mg Inhalation BID Gaye Pollack, MD   0.25 mg at 09/18/18 0831  . carvedilol (COREG) tablet 25 mg  25 mg Oral BID WC Harriet Pho, Tessa N, PA-C   25 mg at 09/18/18 0839  . clopidogrel (PLAVIX) tablet 75 mg  75 mg Oral Daily Gaye Pollack, MD   75 mg at 09/18/18 0838  . docusate sodium (COLACE) capsule 100 mg  100 mg Oral BID Janora Norlander, MD   100 mg at 09/18/18 0831  . enoxaparin (LOVENOX) injection 30 mg  30 mg Subcutaneous QHS Grace Isaac, MD   30 mg at 09/17/18 2145  . feeding supplement (ENSURE ENLIVE) (ENSURE ENLIVE) liquid 237 mL  237 mL Oral TID BM Gaye Pollack, MD   237 mL at 09/18/18 1355  . ferumoxytol (FERAHEME) 510 mg in sodium chloride 0.9 % 100 mL IVPB  510 mg Intravenous Weekly Elmarie Shiley, MD   Stopped at 09/16/18 [phone]  . hydrALAZINE (APRESOLINE) tablet 50 mg  50 mg Oral Q8H  Conte, Tessa N, PA-C   50 mg at 09/18/18 1354  . HYDROcodone-acetaminophen (NORCO/VICODIN) 5-325 MG per tablet 2 tablet  2 tablet Oral Once Schorr, Rhetta Mura, NP      . insulin aspart (novoLOG) injection 0-24 Units  0-24 Units Subcutaneous TID AC & HS Gaye Pollack, MD   2 Units at 09/18/18 [phone]  . insulin detemir (LEVEMIR) injection 15 Units  15 Units Subcutaneous BID Aline August, MD   15 Units at 09/18/18 0839  . metoCLOPramide (REGLAN) injection 5 mg  5 mg Intravenous Q6H Gaye Pollack, MD   5 mg at 09/18/18 1153  . ondansetron (ZOFRAN) tablet 4 mg  4 mg Oral Q6H PRN Gaye Pollack, MD       Or  . ondansetron (ZOFRAN) injection 4 mg  4 mg Intravenous Q6H PRN Gaye Pollack, MD   4 mg at 09/16/18 1007  . pantoprazole (PROTONIX) EC tablet 40 mg  40 mg Oral QAC breakfast Gaye Pollack, MD   40 mg at 09/18/18 4193  . polyethylene glycol (MIRALAX / GLYCOLAX) packet 17 g  17 g Oral Daily Elgie Collard, PA-C   17 g at 09/18/18 7902  . senna (SENOKOT) tablet 8.6 mg  1 tablet Oral BID Lavina Hamman, MD   8.6 mg at 09/18/18 4097  . simethicone (MYLICON) chewable tablet 80 mg  80 mg Oral QID Barrett, Erin R, PA-C   80 mg at 09/18/18 1356  . sodium chloride flush (NS) 0.9 % injection 3 mL  3 mL Intravenous Q12H Gaye Pollack, MD   3 mL at 09/18/18 1154  . sodium chloride flush (NS) 0.9 % injection 3 mL  3 mL Intravenous PRN Gaye Pollack, MD      . traMADol Veatrice Bourbon) tablet 50 mg  50 mg Oral Q12H PRN Gaye Pollack, MD   50 mg at 09/11/18 0609    Allergies:   Patient has no known allergies.    Social History:  The patient  reports that she has never smoked. She has never used smokeless tobacco. She reports that she does not drink alcohol or use drugs.   Family History:  The patient's ***family history includes CAD in her father; Hypertension in her father.  ROS:  Please see the history of present illness.   Otherwise, review of systems are positive for {NONE  DEFAULTED:18576::"none"}.   All other systems are reviewed and negative.    PHYSICAL EXAM: VS:  There were no vitals taken for this visit. , BMI There is no height or weight on file to calculate BMI. GEN: Well nourished, well developed, in no acute distress HEENT: normal Neck: no JVD, carotid bruits, or masses Cardiac: ***RRR; no murmurs, rubs, or gallops,no edema  Respiratory:  clear to auscultation bilaterally, normal work of breathing GI: soft, nontender, nondistended, + BS MS: no deformity or atrophy Skin: warm and dry, no rash Neuro:  Strength and sensation are intact Psych: euthymic mood, full affect   EKG:  EKG {ACTION; IS/IS UEA:[id number]} ordered today. The ekg ordered today demonstrates ***   Recent Labs: 09/10/2018: B Natriuretic Peptide 732.9 09/12/2018: Magnesium 1.9 09/13/2018: ALT 26; Hemoglobin 9.2; Platelets 340 09/15/2018: TSH 5.284 09/18/2018: BUN 35; Creatinine, Ser 2.13; Potassium 3.7; Sodium 127    Lipid Panel    Component Value Date/Time   CHOL 116 08/24/2018 0930   TRIG 171 (H) 08/24/2018 0930   HDL 31 (L) 08/24/2018 0930   CHOLHDL 3.7 08/24/2018 0930   VLDL 34 08/24/2018 0930   LDLCALC 51 08/24/2018 0930      Wt Readings from Last 3 Encounters:  09/18/18 161 lb 8 oz (73.3 kg)      Other studies Reviewed: Additional studies/ records that were reviewed today include:   Echocardiogram 08/21/18: Study Conclusions  - Left ventricle: The cavity size was at the upper limits of   normal. Wall thickness was increased in a pattern of mild LVH.   Systolic function was severely reduced. The estimated ejection   fraction was in the range of 15% to 20%. Diffuse hypokinesis.   Features are consistent with a pseudonormal left ventricular   filling pattern, with concomitant abnormal relaxation and   increased filling pressure (grade 2 diastolic dysfunction). - Mitral valve: There was mild regurgitation. Valve area by   pressure half-time: 1.95  cm^2. - Left atrium: The atrium was moderately dilated. - Right ventricle: Systolic function was mildly reduced. - Right atrium: The atrium was mildly to moderately dilated. - Pulmonary arteries: PA peak pressure: 34 mm Hg (S). - Impressions: There is sevre global LV dysfunction in the setting   of very frequent PVCs. 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  Impressions:  - There is sevre global LV dysfunction in the setting of very   frequent PVCs. 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   Myoview 08/22/2018:  IMPRESSION: 1. Suspected areas of pharmacologically induced ischemia involving the lateral wall of the left ventricle and septum. 2. No definitive scintigraphic evidence of prior infarction. 3. Moderate left ventricular dilatation. 4. Severe left ventricular global hypokinesia with relative akinesia of the septum. Ejection fraction - 24%  LHC 08/23/2018:   Prox RCA lesion is 40% stenosed.  Previously placed Prox RCA to Mid RCA stent is 5% stenosed. Mid RCA to Dist RCA stent (unknown type) is widely patent.  Dist RCA lesion is 40% stenosed. Post Atrio lesion is 65% stenosed.  Mid LM to Ost LAD lesion is 85% stenosed with 75% stenosed side branch in Ost Cx.  Previously placed Prox LAD to Mid LAD stent (unknown type) is widely patent. Mid LAD stent is 15% stenosed.  Mid LAD to Dist LAD lesion is 45% stenosed.  Previously placed Prox Cx to Mid Cx stent is 5% stenosed.  LV end diastolic pressure is moderately  elevated.  SUMMARY  Severe distal left main disease involving the ostium of both LAD and circumflex.  Mild in-stent restenosis in the LAD, circumflex and RCA stents with moderate disease beyond the stent in the LAD.  Echocardiographically severe Cardiomyopathy with Ejection Fraction of roughly 20%.   LVEDP 20 mmHg -she seems relatively euvolemic but has obvious combined systolic and diastolic heart failure    ASSESSMENT AND PLAN:      # Acute on  chronic systolic and diastolic heart failure: # Hypertension: LVEF this admission is 15 to 20%, down from 45% when last checked 05/2018.  Her blood pressure has been very labile.  She is had very frequent PVCs, which may be contributing to her cardiomyopathy.  She already received her medications this morning.  For tomorrow we will plan to increase bisoprolol to 10 mg and reduce amlodipine to 2.5 mg.  She is not on an ACE inhibitor/ARB/Entresto due to acute on chronic renal failure.  # CAD s/p multiple PCIs: # Unstable angina: # Hyperlipidemia: Troponin remains negative but she continues to complain of chest pain, most recently this morning.  She has frequent PVCs as above and reduced systolic function.  Plan for left heart catheterization today if renal function is improved.  Continue aspirin, clopidogrel, and atorvastatin.  # Acute on chronic renal failure:  At baseline her creatinine ranges from 1.5-1.7.  It was 1.85 today.  We will give 500 mL normal saline bolus.  Repeat BMP at 1 PM.  # DM: Glucose very poorly controlled.  She should be on an SGLT2 inhibitor given her heart failure and DM.   2. Chronic combined systolic and diastolic HF:  Likely ischemic in nature as well as PVC-induced. She presented with 1 week of atypical chest and 1 month of productive cough. BNP 532. CXR without pulmonary edema.   Echocardiogram showed EF 15% with global hypokinesis, G2DD, and reduced RV function. This is reduced from 05/2018 when EF was 45%. She appears euvolemic on exam. Her bisoprolol was increased to 10 mg yesterday and she is on amlodipine 2.5 mg. Not a candidate for ACEi/ARB/ARNi due to chronic renal insufficiency.   3. CKD Stage II/III: No previous data in chart. Baseline appears to be 1.5. Cr 1.5 this AM. Will continue to monitor.    4. Uncontrolled, insulin-dependent T2DM: On NPH and SSI. Per primary.   5. HLD: On atorvastatin 20mg  QD. Increased it to 80 mg QD as she should be on  high-intensity statin.   6. Dysuria: Checking UA. Per primary.        1.  ***    4. Claudication symptoms: -Will order bilateral LE duplex and follow symptoms   For questions or updates, please contact [facility] Please consult [email or web address] for contact info under Cardiology/STEMI.   Lyndel Safe NP-C [facility] Pager: [phone] 08/21/2018 12:42 PM   Current medicines are reviewed at length with the patient today.  The patient {ACTIONS; HAS/DOES NOT HAVE:19233} concerns regarding medicines.  The following changes have been made:  {PLAN; NO CHANGE:13088:s}  Labs/ tests ordered today include: *** No orders of the defined types were placed in this encounter.    Disposition:   FU with *** in {gen number 7-93:[id number]} {Days to years:10300}  Signed, Kathyrn Drown, NP  09/18/2018 2:43 PM    [facility] [address], [place], [place]  [address] Phone: [phone]; Fax: [phone]

## 2018-09-19 ENCOUNTER — Ambulatory Visit: Payer: Self-pay | Admitting: Cardiology

## 2018-09-19 ENCOUNTER — Inpatient Hospital Stay (HOSPITAL_COMMUNITY): Payer: Medicaid Other

## 2018-09-19 LAB — RENAL FUNCTION PANEL
ANION GAP: 10 (ref 5–15)
Albumin: 3.3 g/dL — ABNORMAL LOW (ref 3.5–5.0)
BUN: 35 mg/dL — ABNORMAL HIGH (ref 8–23)
CHLORIDE: 97 mmol/L — AB (ref 98–111)
CO2: 25 mmol/L (ref 22–32)
Calcium: 9 mg/dL (ref 8.9–10.3)
Creatinine, Ser: 2.04 mg/dL — ABNORMAL HIGH (ref 0.44–1.00)
GFR calc Af Amer: 28 mL/min — ABNORMAL LOW (ref >60)
GFR calc non Af Amer: 24 mL/min — ABNORMAL LOW (ref >60)
GLUCOSE: 97 mg/dL (ref 70–99)
POTASSIUM: 3.7 mmol/L (ref 3.5–5.1)
Phosphorus: 3.6 mg/dL (ref 2.5–4.6)
Sodium: 132 mmol/L — ABNORMAL LOW (ref 135–145)

## 2018-09-19 LAB — GLUCOSE, CAPILLARY
GLUCOSE-CAPILLARY: 184 mg/dL — AB (ref 70–99)
GLUCOSE-CAPILLARY: 142 mg/dL — AB (ref 70–99)
Glucose-Capillary: 82 mg/dL (ref 70–99)
Glucose-Capillary: 147 mg/dL — ABNORMAL HIGH (ref 70–99)
Glucose-Capillary: 167 mg/dL — ABNORMAL HIGH (ref 70–99)
Glucose-Capillary: 137 mg/dL — ABNORMAL HIGH (ref 70–99)

## 2018-09-19 MED ORDER — SIMETHICONE 80 MG PO CHEW
160.0000 mg | CHEWABLE_TABLET | Freq: Three times a day (TID) | ORAL | Status: DC
  Administered 2018-09-19 – 2018-09-22 (×9): 160 mg via ORAL
  Filled 2018-09-19 (×9): qty 2

## 2018-09-19 NOTE — Progress Notes (Signed)
Pt ambulated about 400 feet in hallway with a walker and stand by.  Tolerated fair.  Complained of slight shortness of breath after activity.  Will continue to monitor.  Lupita Dawn, RN

## 2018-09-19 NOTE — Progress Notes (Signed)
CARDIAC REHAB PHASE I   PRE:  Rate/Rhythm: 73 SR  BP:  Supine:   Sitting: 154/50  Standing:    SaO2: 96%RA  MODE:  Ambulation: 180 ft   POST:  Rate/Rhythm: 79 SR  BP:  Supine:   Sitting: 165/53  Standing:    SaO2: 95%RA 1500-1515 Pt walked 180 ft on RA with rollator and minimal asst. Husband helped also. Pt did not want to go farther but tolerated this distance well. A little SOB after walk but sats good on RA. To sitting on side of bed a few minutes and then she laid down. Husband in room with pt.    Graylon Good, RN BSN  09/19/2018 3:12 PM

## 2018-09-19 NOTE — Progress Notes (Signed)
21 Days Post-Op Procedure(s) (LRB): CORONARY ARTERY BYPASS GRAFTING (CABG) x three, using left internal mammary artery and right leg greater saphenous vein harvested endoscopically (N/A) TRANSESOPHAGEAL ECHOCARDIOGRAM (TEE) (N/A) Subjective:  Son in room with her to translate. Had 5 loose stools yesterday after lactulose. Abdominal bloating better. Taking liquids well.  Still complains of some shortness of breath after ambulation and lying flat.  Objective: Vital signs in last 24 hours: Temp:  [98.1 F (36.7 C)-98.6 F (37 C)] 98.6 F (37 C) (11/25 0319) Pulse Rate:  [74] 74 (11/24 1148) Cardiac Rhythm: Normal sinus rhythm (11/25 0700) Resp:  [16-19] 17 (11/25 0319) BP: (124-147)/(45-57) 146/52 (11/25 0319) SpO2:  [93 %-98 %] 93 % (11/25 0319) Weight:  [75.1 kg] 75.1 kg (11/25 0319)  Hemodynamic parameters for last 24 hours:    Intake/Output from previous day: 11/24 0701 - 11/25 0700 In: 437 [P.O.:437] Out: -  Intake/Output this shift: No intake/output data recorded.  General appearance: alert and cooperative Heart: regular rate and rhythm, S1, S2 normal, no murmur, click, rub or gallop Lungs: clear to auscultation bilaterally Abdomen: soft, non-tender; bowel sounds normal  Extremities: edema mild Wound: incisions ok  Lab Results: No results for input(s): WBC, HGB, HCT, PLT in the last 72 hours. BMET:  Recent Labs    09/18/18 0255 09/19/18 0319  NA 127* 132*  K 3.7 3.7  CL 93* 97*  CO2 23 25  GLUCOSE 99 97  BUN 35* 35*  CREATININE 2.13* 2.04*  CALCIUM 8.7* 9.0    PT/INR: No results for input(s): LABPROT, INR in the last 72 hours. ABG    Component Value Date/Time   PHART 7.342 (L) 08/30/2018 0133   HCO3 23.3 08/30/2018 0133   TCO2 25 08/30/2018 1523   ACIDBASEDEF 2.0 08/30/2018 0133   O2SAT 75.1 08/31/2018 0400   CBG (last 3)  Recent Labs    09/18/18 2318 09/19/18 0412 09/19/18 0609  GLUCAP 194* 82 184*   KUB yesterday reviewed and shows normal  bowel gas pattern, small amount of stool in colon, lungs look clear with small left effusion blunting the CPA.  Assessment/Plan: S/P Procedure(s) (LRB): CORONARY ARTERY BYPASS GRAFTING (CABG) x three, using left internal mammary artery and right leg greater saphenous vein harvested endoscopically (N/A) TRANSESOPHAGEAL ECHOCARDIOGRAM (TEE) (N/A)  Hemodynamically stable on current meds.   Chronic kidney disease: creat stable, metabolic acidosis corrected and sodium improving. Nephrology following. Still has some total body volume excess but probably only 5 lbs or so.  DM: glucose still labile on current regimen but probably due to variable po intake.  Ileus/gastroparesis: seems improved. GI is following and can decide about advancing diet.  Continue IS, ambulation.   LOS: 28 days    Gaye Pollack 09/19/2018

## 2018-09-19 NOTE — Progress Notes (Signed)
Pt has ambulated x2 200 ft with rollator. She looks and feels better today. Family present. Will f/u later. Yves Dill CES, ACSM 10:21 AM 09/19/2018

## 2018-09-19 NOTE — Progress Notes (Signed)
Subjective: The patient was seen and examined at bedside. She complains of multiple loose watery stools and some improvement in bloating. She continues to feel short of breath which worsens underlying flat and is unable to speak in full sentence.  Objective: Vital signs in last 24 hours: Temp:  [98.1 F (36.7 C)-98.6 F (37 C)] 98.6 F (37 C) (11/25 0319) Pulse Rate:  [74] 74 (11/24 1148) Resp:  [16-19] 17 (11/25 0319) BP: (124-147)/(45-57) 146/52 (11/25 0319) SpO2:  [93 %-96 %] 93 % (11/25 0319) Weight:  [75.1 kg] 75.1 kg (11/25 0319) Weight change: 1.844 kg Last BM Date: 09/18/18  IP:JASNKNL up on bed, appears comfortable GENERAL:moist oral mucosa ABDOMEN:soft, mild distention, normoactive bowel  EXTREMITIES:no deformity  Lab Results: Results for orders placed or performed during the hospital encounter of 08/20/18 (from the past 48 hour(s))  Glucose, capillary     Status: Abnormal   Collection Time: 09/17/18 11:43 AM  Result Value Ref Range   Glucose-Capillary 152 (H) 70 - 99 mg/dL  Glucose, capillary     Status: Abnormal   Collection Time: 09/17/18  5:51 PM  Result Value Ref Range   Glucose-Capillary 115 (H) 70 - 99 mg/dL  Glucose, capillary     Status: None   Collection Time: 09/17/18  9:26 PM  Result Value Ref Range   Glucose-Capillary 98 70 - 99 mg/dL  Glucose, capillary     Status: Abnormal   Collection Time: 09/17/18 11:29 PM  Result Value Ref Range   Glucose-Capillary 155 (H) 70 - 99 mg/dL   Comment 1 Notify RN   Renal function panel     Status: Abnormal   Collection Time: 09/18/18  2:55 AM  Result Value Ref Range   Sodium 127 (L) 135 - 145 mmol/L   Potassium 3.7 3.5 - 5.1 mmol/L   Chloride 93 (L) 98 - 111 mmol/L   CO2 23 22 - 32 mmol/L   Glucose, Bld 99 70 - 99 mg/dL   BUN 35 (H) 8 - 23 mg/dL   Creatinine, Ser 2.13 (H) 0.44 - 1.00 mg/dL   Calcium 8.7 (L) 8.9 - 10.3 mg/dL   Phosphorus 4.2 2.5 - 4.6 mg/dL   Albumin 2.8 (L) 3.5 - 5.0 g/dL   GFR calc non  Af Amer 23 (L) >60 mL/min   GFR calc Af Amer 27 (L) >60 mL/min    Comment: (NOTE) The eGFR has been calculated using the CKD EPI equation. This calculation has not been validated in all clinical situations. eGFR's persistently <60 mL/min signify possible Chronic Kidney Disease.    Anion gap 11 5 - 15    Comment: Performed at [facility] [address], [place], [place] [address]  Glucose, capillary     Status: Abnormal   Collection Time: 09/18/18  6:15 AM  Result Value Ref Range   Glucose-Capillary 135 (H) 70 - 99 mg/dL   Comment 1 Notify RN   Glucose, capillary     Status: Abnormal   Collection Time: 09/18/18 11:47 AM  Result Value Ref Range   Glucose-Capillary 107 (H) 70 - 99 mg/dL   Comment 1 Notify RN    Comment 2 Document in Chart   Glucose, capillary     Status: Abnormal   Collection Time: 09/18/18  4:26 PM  Result Value Ref Range   Glucose-Capillary 147 (H) 70 - 99 mg/dL   Comment 1 Notify RN    Comment 2 Document in Chart   Glucose, capillary  Status: None   Collection Time: 09/18/18  9:28 PM  Result Value Ref Range   Glucose-Capillary 95 70 - 99 mg/dL  Glucose, capillary     Status: Abnormal   Collection Time: 09/18/18 11:18 PM  Result Value Ref Range   Glucose-Capillary 194 (H) 70 - 99 mg/dL   Comment 1 Document in Chart   Renal function panel     Status: Abnormal   Collection Time: 09/19/18  3:19 AM  Result Value Ref Range   Sodium 132 (L) 135 - 145 mmol/L   Potassium 3.7 3.5 - 5.1 mmol/L   Chloride 97 (L) 98 - 111 mmol/L   CO2 25 22 - 32 mmol/L   Glucose, Bld 97 70 - 99 mg/dL   BUN 35 (H) 8 - 23 mg/dL   Creatinine, Ser 2.04 (H) 0.44 - 1.00 mg/dL   Calcium 9.0 8.9 - 10.3 mg/dL   Phosphorus 3.6 2.5 - 4.6 mg/dL   Albumin 3.3 (L) 3.5 - 5.0 g/dL   GFR calc non Af Amer 24 (L) >60 mL/min   GFR calc Af Amer 28 (L) >60 mL/min    Comment: (NOTE) The eGFR has been calculated using the CKD EPI equation. This calculation has not been validated in all  clinical situations. eGFR's persistently <60 mL/min signify possible Chronic Kidney Disease.    Anion gap 10 5 - 15    Comment: Performed at [facility] [address] [place], [place] [address]  Glucose, capillary     Status: None   Collection Time: 09/19/18  4:12 AM  Result Value Ref Range   Glucose-Capillary 82 70 - 99 mg/dL   Comment 1 Document in Chart   Glucose, capillary     Status: Abnormal   Collection Time: 09/19/18  6:09 AM  Result Value Ref Range   Glucose-Capillary 184 (H) 70 - 99 mg/dL   Comment 1 Document in Chart     Studies/Results: Dg Abd Portable 1v  Result Date: 09/18/2018 CLINICAL DATA:  Abdominal pain and constipation. EXAM: PORTABLE ABDOMEN - 1 VIEW COMPARISON:  09/16/2018 chest radiograph and 09/14/2018 abdominal radiograph FINDINGS: The bowel gas pattern is normal. No dilated bowel loops are identified. No suspicious calcifications are present. LEFT LOWER lung consolidation/atelectasis again noted. IMPRESSION: No acute abdominal abnormality.  Normal bowel gas pattern. Electronically Signed   By: Margarette Canada M.D.   On: 09/18/2018 10:52    Medications: I have reviewed the patient's current medications.  Assessment: Ileus/gastroparesis-improving Renal impairment, BUN 35/creatinine 2.0/GFR 24  Plan: Continue Simethicone and metoclopramide. We will start low fiber, low residue,  Gastroparesis diet. Recommend continue early aggressive ambulation if tolerated We will DC stool softeners, currently on MiraLAX and senna-may need to be held if loose bowel  movements continue. Abdominal x-ray today for follow-up   Ronnette Juniper 09/19/2018, 8:55 AM   Pager [phone] If no answer or after 5 PM call [phone]

## 2018-09-19 NOTE — Progress Notes (Signed)
Admit: 08/20/2018 LOS: 53  45F with AKI and Hyponatremia, s/p CABG Subjective:  . Stable SCr . SNa improving . Ambulating, minimal dyspnea  11/24 0701 - 11/25 0700 In: 437 [P.O.:437] Out: -   Filed Weights   09/16/18 0500 09/18/18 0500 09/19/18 0319  Weight: 78.5 kg 73.3 kg 75.1 kg    Scheduled Meds: . amLODipine  10 mg Oral Daily  . aspirin EC  81 mg Oral Daily  . atorvastatin  80 mg Oral Daily  . budesonide  0.25 mg Inhalation BID  . carvedilol  25 mg Oral BID WC  . clopidogrel  75 mg Oral Daily  . enoxaparin (LOVENOX) injection  30 mg Subcutaneous QHS  . feeding supplement (ENSURE ENLIVE)  237 mL Oral TID BM  . hydrALAZINE  50 mg Oral Q8H  . HYDROcodone-acetaminophen  2 tablet Oral Once  . insulin aspart  0-24 Units Subcutaneous TID AC & HS  . insulin detemir  15 Units Subcutaneous BID  . metoCLOPramide (REGLAN) injection  5 mg Intravenous Q6H  . pantoprazole  40 mg Oral QAC breakfast  . polyethylene glycol  17 g Oral Daily  . simethicone  160 mg Oral TID AC  . sodium chloride flush  3 mL Intravenous Q12H   Continuous Infusions: . ferumoxytol Stopped (09/16/18 0958)   PRN Meds:.acetaminophen, bisacodyl, ondansetron **OR** ondansetron (ZOFRAN) IV, sodium chloride flush, traMADol  Current Labs: reviewed    Physical Exam:  Blood pressure (!) 152/56, pulse 72, temperature 97.9 F (36.6 C), temperature source Oral, resp. rate 17, height 5\' 1"  (1.549 m), weight 75.1 kg, SpO2 95 %. NAD RRR CTAB No sig LEE Nonfocal  A 1. AoCKD3, stable SCr at 2 2. Hyponatremia, improving.  Cont fluid restriction 3. S/p CABG 4. Anemia, rec IV Fe 5. HTN  P . No chagnes today . Will follow for another 24h   Pearson Grippe MD 09/19/2018, 12:36 PM  Recent Labs  Lab 09/17/18 0540 09/18/18 0255 09/19/18 0319  NA 123* 127* 132*  K 3.9 3.7 3.7  CL 92* 93* 97*  CO2 23 23 25   GLUCOSE 78 99 97  BUN 36* 35* 35*  CREATININE 1.99* 2.13* 2.04*  CALCIUM 8.6* 8.7* 9.0  PHOS 4.2  4.2 3.6   Recent Labs  Lab 09/13/18 0319  WBC 9.5  HGB 9.2*  HCT 28.0*  MCV 84.8  PLT 340

## 2018-09-20 ENCOUNTER — Inpatient Hospital Stay (HOSPITAL_COMMUNITY): Payer: Medicaid Other

## 2018-09-20 LAB — RENAL FUNCTION PANEL
Albumin: 3.2 g/dL — ABNORMAL LOW (ref 3.5–5.0)
Anion gap: 9 (ref 5–15)
BUN: 32 mg/dL — ABNORMAL HIGH (ref 8–23)
CHLORIDE: 100 mmol/L (ref 98–111)
CO2: 24 mmol/L (ref 22–32)
Calcium: 9 mg/dL (ref 8.9–10.3)
Creatinine, Ser: 1.81 mg/dL — ABNORMAL HIGH (ref 0.44–1.00)
GFR, EST AFRICAN AMERICAN: 33 mL/min — AB (ref >60)
GFR, EST NON AFRICAN AMERICAN: 28 mL/min — AB (ref >60)
Glucose, Bld: 67 mg/dL — ABNORMAL LOW (ref 70–99)
Phosphorus: 3.4 mg/dL (ref 2.5–4.6)
Potassium: 3.9 mmol/L (ref 3.5–5.1)
Sodium: 133 mmol/L — ABNORMAL LOW (ref 135–145)

## 2018-09-20 LAB — GLUCOSE, CAPILLARY
GLUCOSE-CAPILLARY: 77 mg/dL (ref 70–99)
GLUCOSE-CAPILLARY: 208 mg/dL — AB (ref 70–99)
GLUCOSE-CAPILLARY: 171 mg/dL — AB (ref 70–99)
Glucose-Capillary: 197 mg/dL — ABNORMAL HIGH (ref 70–99)

## 2018-09-20 MED ORDER — POTASSIUM CHLORIDE CRYS ER 20 MEQ PO TBCR
20.0000 meq | EXTENDED_RELEASE_TABLET | Freq: Every day | ORAL | Status: DC
  Administered 2018-09-20 – 2018-09-21 (×2): 20 meq via ORAL
  Filled 2018-09-20 (×3): qty 1

## 2018-09-20 MED ORDER — TORSEMIDE 20 MG PO TABS
20.0000 mg | ORAL_TABLET | Freq: Every day | ORAL | Status: DC
  Administered 2018-09-20 – 2018-09-21 (×2): 20 mg via ORAL
  Filled 2018-09-20 (×2): qty 1

## 2018-09-20 NOTE — Progress Notes (Signed)
Subjective: The patient was seen and examined at bedside. She continues to feel bloated and complains of epigastric/left upper discomfort. She continues to feel short of breath on exertion and on underlying flat  Objective: Vital signs in last 24 hours: Temp:  [97.9 F (36.6 C)-98.6 F (37 C)] 98.1 F (36.7 C) (11/26 0534) Pulse Rate:  [72-80] 80 (11/26 0534) Resp:  [17-26] 26 (11/26 0534) BP: (152-164)/(56-58) 164/56 (11/26 0534) SpO2:  [93 %-96 %] 94 % (11/26 0806) Weight:  [74.6 kg] 74.6 kg (11/26 0322) Weight change: -0.5 kg Last BM Date: 09/19/18  UX:NATFTDD up on bed,mild pallor GENERAL:able to speak a few words not full sentences ABDOMEN:soft, mild epigastric and left upper quadrant tenderness, normal active bowel sounds EXTREMITIES:no deformity  Lab Results: Results for orders placed or performed during the hospital encounter of 08/20/18 (from the past 48 hour(s))  Glucose, capillary     Status: Abnormal   Collection Time: 09/18/18 11:47 AM  Result Value Ref Range   Glucose-Capillary 107 (H) 70 - 99 mg/dL   Comment 1 Notify RN    Comment 2 Document in Chart   Glucose, capillary     Status: Abnormal   Collection Time: 09/18/18  4:26 PM  Result Value Ref Range   Glucose-Capillary 147 (H) 70 - 99 mg/dL   Comment 1 Notify RN    Comment 2 Document in Chart   Glucose, capillary     Status: None   Collection Time: 09/18/18  9:28 PM  Result Value Ref Range   Glucose-Capillary 95 70 - 99 mg/dL  Glucose, capillary     Status: Abnormal   Collection Time: 09/18/18 11:18 PM  Result Value Ref Range   Glucose-Capillary 194 (H) 70 - 99 mg/dL   Comment 1 Document in Chart   Renal function panel     Status: Abnormal   Collection Time: 09/19/18  3:19 AM  Result Value Ref Range   Sodium 132 (L) 135 - 145 mmol/L   Potassium 3.7 3.5 - 5.1 mmol/L   Chloride 97 (L) 98 - 111 mmol/L   CO2 25 22 - 32 mmol/L   Glucose, Bld 97 70 - 99 mg/dL   BUN 35 (H) 8 - 23 mg/dL   Creatinine,  Ser 2.04 (H) 0.44 - 1.00 mg/dL   Calcium 9.0 8.9 - 10.3 mg/dL   Phosphorus 3.6 2.5 - 4.6 mg/dL   Albumin 3.3 (L) 3.5 - 5.0 g/dL   GFR calc non Af Amer 24 (L) >60 mL/min   GFR calc Af Amer 28 (L) >60 mL/min    Comment: (NOTE) The eGFR has been calculated using the CKD EPI equation. This calculation has not been validated in all clinical situations. eGFR's persistently <60 mL/min signify possible Chronic Kidney Disease.    Anion gap 10 5 - 15    Comment: Performed at [facility] [address], [place], [place] [address]  Glucose, capillary     Status: None   Collection Time: 09/19/18  4:12 AM  Result Value Ref Range   Glucose-Capillary 82 70 - 99 mg/dL   Comment 1 Document in Chart   Glucose, capillary     Status: Abnormal   Collection Time: 09/19/18  6:09 AM  Result Value Ref Range   Glucose-Capillary 184 (H) 70 - 99 mg/dL   Comment 1 Document in Chart   Glucose, capillary     Status: Abnormal   Collection Time: 09/19/18 11:24 AM  Result Value Ref Range   Glucose-Capillary  147 (H) 70 - 99 mg/dL   Comment 1 Notify RN    Comment 2 Document in Chart   Glucose, capillary     Status: Abnormal   Collection Time: 09/19/18  4:18 PM  Result Value Ref Range   Glucose-Capillary 167 (H) 70 - 99 mg/dL   Comment 1 Notify RN    Comment 2 Document in Chart   Glucose, capillary     Status: Abnormal   Collection Time: 09/19/18  9:24 PM  Result Value Ref Range   Glucose-Capillary 137 (H) 70 - 99 mg/dL  Glucose, capillary     Status: Abnormal   Collection Time: 09/19/18 10:10 PM  Result Value Ref Range   Glucose-Capillary 142 (H) 70 - 99 mg/dL   Comment 1 Document in Chart   Renal function panel     Status: Abnormal   Collection Time: 09/20/18  3:26 AM  Result Value Ref Range   Sodium 133 (L) 135 - 145 mmol/L   Potassium 3.9 3.5 - 5.1 mmol/L   Chloride 100 98 - 111 mmol/L   CO2 24 22 - 32 mmol/L   Glucose, Bld 67 (L) 70 - 99 mg/dL   BUN 32 (H) 8 - 23 mg/dL   Creatinine, Ser  1.81 (H) 0.44 - 1.00 mg/dL   Calcium 9.0 8.9 - 10.3 mg/dL   Phosphorus 3.4 2.5 - 4.6 mg/dL   Albumin 3.2 (L) 3.5 - 5.0 g/dL   GFR calc non Af Amer 28 (L) >60 mL/min   GFR calc Af Amer 33 (L) >60 mL/min    Comment: (NOTE) The eGFR has been calculated using the CKD EPI equation. This calculation has not been validated in all clinical situations. eGFR's persistently <60 mL/min signify possible Chronic Kidney Disease.    Anion gap 9 5 - 15    Comment: Performed at [facility] [address], [place], [place] [address]  Glucose, capillary     Status: None   Collection Time: 09/20/18  5:57 AM  Result Value Ref Range   Glucose-Capillary 77 70 - 99 mg/dL   Comment 1 Document in Chart     Studies/Results: Dg Abd 1 View  Result Date: 09/19/2018 CLINICAL DATA:  Abdominal pain, distention EXAM: ABDOMEN - 1 VIEW COMPARISON:  09/18/2018 FINDINGS: The bowel gas pattern is normal. No radio-opaque calculi or other significant radiographic abnormality are seen. IMPRESSION: Negative. Electronically Signed   By: Rolm Baptise M.D.   On: 09/19/2018 09:52   Dg Chest Port 1 View  Result Date: 09/20/2018 CLINICAL DATA:  Shortness of breath EXAM: PORTABLE CHEST 1 VIEW COMPARISON:  09/16/2018 FINDINGS: Cardiac shadow is enlarged but stable. Postsurgical changes are again seen. Small bilateral pleural effusions are noted slightly greater than that seen on the prior exam. No focal confluent infiltrate is seen. No bony abnormality is noted. IMPRESSION: Bilateral pleural effusions slightly increased from the prior study. Electronically Signed   By: Inez Catalina M.D.   On: 09/20/2018 08:54    Medications: I have reviewed the patient's current medications.  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 As an outpatient recommend small frequent meals of low residue and low fiber diet,good control of blood sugar. We will sign off, please recall if needed.    Ronnette Juniper 09/20/2018, 10:53 AM   Pager [phone] If no answer or after 5 PM call [phone]

## 2018-09-20 NOTE — Progress Notes (Signed)
Pt ambulated 200 ft in the hallway using rollator with husband.  Pt felt fine and returned to room for VS. Pt resting with call bell within reach.  Will continue to monitor. Payton Emerald, RN

## 2018-09-20 NOTE — Progress Notes (Addendum)
[facility] [address]       [place] [address]             [phone]      22 Days Post-Op Procedure(s) (LRB): CORONARY ARTERY BYPASS GRAFTING (CABG) x three, using left internal mammary artery and right leg greater saphenous vein harvested endoscopically (N/A) TRANSESOPHAGEAL ECHOCARDIOGRAM (TEE) (N/A) Subjective: My conversation was completely with the son this morning. The patient was sleeping since she had not slept all night.   Objective: Vital signs in last 24 hours: Temp:  [97.9 F (36.6 C)-98.6 F (37 C)] 98.1 F (36.7 C) (11/26 0534) Pulse Rate:  [72-80] 80 (11/26 0534) Cardiac Rhythm: Normal sinus rhythm;Bundle branch block (11/25 2025) Resp:  [17-26] 26 (11/26 0534) BP: (152-164)/(56-65) 164/56 (11/26 0534) SpO2:  [93 %-96 %] 93 % (11/26 0534) Weight:  [74.6 kg] 74.6 kg (11/26 0322)     Intake/Output from previous day: 11/25 0701 - 11/26 0700 In: 486 [P.O.:486] Out: -  Intake/Output this shift: No intake/output data recorded.  General appearance: alert, cooperative and no distress Heart: regular rate and rhythm, S1, S2 normal, no murmur, click, rub or gallop Lungs: clear to auscultation bilaterally Abdomen: soft, non-tender; bowel sounds normal; no masses,  no organomegaly Extremities: extremities normal, atraumatic, no cyanosis or edema Wound: clean and dry  Lab Results: No results for input(s): WBC, HGB, HCT, PLT in the last 72 hours. BMET:  Recent Labs    09/19/18 0319 09/20/18 0326  NA 132* 133*  K 3.7 3.9  CL 97* 100  CO2 25 24  GLUCOSE 97 67*  BUN 35* 32*  CREATININE 2.04* 1.81*  CALCIUM 9.0 9.0    PT/INR: No results for input(s): LABPROT, INR in the last 72 hours. ABG    Component Value Date/Time   PHART 7.342 (L) 08/30/2018 0133   HCO3 23.3 08/30/2018 0133   TCO2 25 08/30/2018 1523   ACIDBASEDEF 2.0 08/30/2018 0133   O2SAT 75.1 08/31/2018 0400   CBG (last 3)  Recent Labs    09/19/18 2124 09/19/18 2210  09/20/18 0557  GLUCAP 137* 142* 77    Assessment/Plan: S/P Procedure(s) (LRB): CORONARY ARTERY BYPASS GRAFTING (CABG) x three, using left internal mammary artery and right leg greater saphenous vein harvested endoscopically (N/A) TRANSESOPHAGEAL ECHOCARDIOGRAM (TEE) (N/A)  1. CV-remains in NSR, hemodynamically stable. BP high at times.  2. Pulm-On room air but per son was short of breath last night and she did not sleep. I will order a chest CXR since she has not had one since 11/22.  3. Renal-nephrology following, on a fluid restriction. Creatinine trending down and is 1.8 today 4. Endo-blood glucose well controlled on current regimen. Now tolerating a soft diet 5. GI-advanced to soft diet per GI. Patient passing gas now. Continue medical regimen 6. Encouraged incentive spirometer and ambulation in the halls.   Plan: Possibly home by the end of the week. Son wants to book flight today and feels mid Feb would work best. Will discuss with Dr. Cyndia Bent.   Chest xray from today listed below:  FINDINGS: Cardiac shadow is enlarged but stable. Postsurgical changes are again seen. Small bilateral pleural effusions are noted slightly greater than that seen on the prior exam. No focal confluent infiltrate is seen. No bony abnormality is noted.  IMPRESSION: Bilateral pleural effusions slightly increased from the prior study.   Electronically Signed   By: Inez Catalina M.D.   On: 09/20/2018 08:54    LOS: 29  days    Elgie Collard 09/20/2018  Chart reviewed, patient examined, agree with above. Husband is with her. She has walked and is sitting up in chair. Eating some and taking Nepro supplement. No BM today. GI wants to continue Reglan and simethicone and has signed off. Still complains of some shortness of breath with ambulation. CXR looks ok with small bilateral pleural effusions.  Renal function stable with creat down to 1.8 which is baseline. Nephrology has signed off and will  follow up in office. Her weight is still above preop but she only has very mild edema. Will give her Demedex for a couple days.  I think she is about ready for discharge.  Renal function

## 2018-09-20 NOTE — Progress Notes (Signed)
Admit: 08/20/2018 LOS: 11  76F with AKI and Hyponatremia, s/p CABG Subjective:  . Serum creatinine improved to 1.81, serum sodium 133 . UOP not recorded . Husband asks about edema, has mild edema around sock line, trace . Lying flat on room air, breathing comfortably   11/25 0701 - 11/26 0700 In: 486 [P.O.:486] Out: -   Filed Weights   09/18/18 0500 09/19/18 0319 09/20/18 0322  Weight: 73.3 kg 75.1 kg 74.6 kg    Scheduled Meds: . amLODipine  10 mg Oral Daily  . aspirin EC  81 mg Oral Daily  . atorvastatin  80 mg Oral Daily  . budesonide  0.25 mg Inhalation BID  . carvedilol  25 mg Oral BID WC  . clopidogrel  75 mg Oral Daily  . enoxaparin (LOVENOX) injection  30 mg Subcutaneous QHS  . feeding supplement (ENSURE ENLIVE)  237 mL Oral TID BM  . hydrALAZINE  50 mg Oral Q8H  . HYDROcodone-acetaminophen  2 tablet Oral Once  . insulin aspart  0-24 Units Subcutaneous TID AC & HS  . insulin detemir  15 Units Subcutaneous BID  . metoCLOPramide (REGLAN) injection  5 mg Intravenous Q6H  . pantoprazole  40 mg Oral QAC breakfast  . polyethylene glycol  17 g Oral Daily  . simethicone  160 mg Oral TID AC  . sodium chloride flush  3 mL Intravenous Q12H   Continuous Infusions: . ferumoxytol Stopped (09/16/18 0958)   PRN Meds:.acetaminophen, bisacodyl, ondansetron **OR** ondansetron (ZOFRAN) IV, sodium chloride flush, traMADol  Current Labs: reviewed    Physical Exam:  Blood pressure (!) 164/56, pulse 84, temperature 98.1 F (36.7 C), temperature source Oral, resp. rate (!) 26, height 5\' 1"  (1.549 m), weight 74.6 kg, SpO2 94 %. NAD RRR CTAB No sig LEE, trace around elastic sock line.  Nonfocal  A 1. AoCKD3, slowly improving 2. Hyponatremia, improving.  Cont fluid restriction 3. S/p CABG 4. Anemia, rec IV Fe 5. HTN  P . No further suggestions of the current time . We will set up follow-up in our office with labs . Can continue using furosemide as clinically  indicated  Pearson Grippe MD 09/20/2018, 11:10 AM  Recent Labs  Lab 09/18/18 0255 09/19/18 0319 09/20/18 0326  NA 127* 132* 133*  K 3.7 3.7 3.9  CL 93* 97* 100  CO2 23 25 24   GLUCOSE 99 97 67*  BUN 35* 35* 32*  CREATININE 2.13* 2.04* 1.81*  CALCIUM 8.7* 9.0 9.0  PHOS 4.2 3.6 3.4   No results for input(s): WBC, NEUTROABS, HGB, HCT, MCV, PLT in the last 168 hours.

## 2018-09-21 LAB — GLUCOSE, CAPILLARY
Glucose-Capillary: 74 mg/dL (ref 70–99)
Glucose-Capillary: 267 mg/dL — ABNORMAL HIGH (ref 70–99)
Glucose-Capillary: 74 mg/dL (ref 70–99)

## 2018-09-21 LAB — RENAL FUNCTION PANEL
Albumin: 3.1 g/dL — ABNORMAL LOW (ref 3.5–5.0)
Anion gap: 8 (ref 5–15)
BUN: 32 mg/dL — AB (ref 8–23)
CALCIUM: 9.1 mg/dL (ref 8.9–10.3)
CO2: 25 mmol/L (ref 22–32)
CREATININE: 2.05 mg/dL — AB (ref 0.44–1.00)
Chloride: 101 mmol/L (ref 98–111)
GFR calc non Af Amer: 25 mL/min — ABNORMAL LOW (ref >60)
GFR, EST AFRICAN AMERICAN: 29 mL/min — AB (ref >60)
GLUCOSE: 99 mg/dL (ref 70–99)
Phosphorus: 3.4 mg/dL (ref 2.5–4.6)
Potassium: 4.5 mmol/L (ref 3.5–5.1)
SODIUM: 134 mmol/L — AB (ref 135–145)

## 2018-09-21 MED ORDER — GLUCERNA SHAKE PO LIQD
237.0000 mL | Freq: Three times a day (TID) | ORAL | Status: DC
  Administered 2018-09-21 – 2018-09-22 (×4): 237 mL via ORAL
  Filled 2018-09-21: qty 237

## 2018-09-21 MED ORDER — INSULIN ASPART PROT & ASPART (70-30 MIX) 100 UNIT/ML ~~LOC~~ SUSP
15.0000 [IU] | Freq: Two times a day (BID) | SUBCUTANEOUS | Status: DC
  Administered 2018-09-21 – 2018-09-22 (×2): 15 [IU] via SUBCUTANEOUS
  Filled 2018-09-21: qty 10

## 2018-09-21 NOTE — Progress Notes (Addendum)
[facility] [address]       [place] [address]             [phone]      23 Days Post-Op Procedure(s) (LRB): CORONARY ARTERY BYPASS GRAFTING (CABG) x three, using left internal mammary artery and right leg greater saphenous vein harvested endoscopically (N/A) TRANSESOPHAGEAL ECHOCARDIOGRAM (TEE) (N/A) Subjective: Feels a little better this morning. She feels her left side of her face is swollen  Objective: Vital signs in last 24 hours: Temp:  [98.2 F (36.8 C)-98.8 F (37.1 C)] 98.7 F (37.1 C) (11/27 0420) Pulse Rate:  [74-84] 76 (11/27 0420) Cardiac Rhythm: Normal sinus rhythm (11/27 0700) Resp:  [17-18] 17 (11/27 0420) BP: (148-182)/(49-58) 152/58 (11/27 0420) SpO2:  [94 %-98 %] 95 % (11/27 0420) Weight:  [74.4 kg] 74.4 kg (11/27 0416)     Intake/Output from previous day: No intake/output data recorded. Intake/Output this shift: No intake/output data recorded.  General appearance: alert, cooperative and no distress Heart: regular rate and rhythm, S1, S2 normal, no murmur, click, rub or gallop Lungs: clear to auscultation bilaterally Abdomen: soft, non-tender; bowel sounds normal; no masses,  no organomegaly Extremities: extremities normal, atraumatic, no cyanosis or edema Wound: clean and dry  Lab Results: No results for input(s): WBC, HGB, HCT, PLT in the last 72 hours. BMET:  Recent Labs    09/20/18 0326 09/21/18 0340  NA 133* 134*  K 3.9 4.5  CL 100 101  CO2 24 25  GLUCOSE 67* 99  BUN 32* 32*  CREATININE 1.81* 2.05*  CALCIUM 9.0 9.1    PT/INR: No results for input(s): LABPROT, INR in the last 72 hours. ABG    Component Value Date/Time   PHART 7.342 (L) 08/30/2018 0133   HCO3 23.3 08/30/2018 0133   TCO2 25 08/30/2018 1523   ACIDBASEDEF 2.0 08/30/2018 0133   O2SAT 75.1 08/31/2018 0400   CBG (last 3)  Recent Labs    09/20/18 1634 09/20/18 2124 09/21/18 0610  GLUCAP 197* 171* 74    Assessment/Plan: S/P Procedure(s)  (LRB): CORONARY ARTERY BYPASS GRAFTING (CABG) x three, using left internal mammary artery and right leg greater saphenous vein harvested endoscopically (N/A) TRANSESOPHAGEAL ECHOCARDIOGRAM (TEE) (N/A)  1. CV-remains in NSR, hemodynamically stable. BP high at times.  2. Pulm-On room air but per son was short of breath last night and she did not sleep. CXR stable yesterday.  3. Renal-nephrology following, on a fluid restriction. Creatinine  2.05 today. On Demadex for fluid overload. Weight is trending down. 4. Endo-blood glucose well controlled on current regimen. Now tolerating a soft diet 5. GI-advanced to soft diet per GI. Patient passing gas now. Continue medical regimen 6. Encouraged incentive spirometer and ambulation in the halls.   Plan: Renal and GI have both signed off and will provide follow-up for the patient after discharge. She is to continue Demadex for a few days and then is ready for discharge. No BM in 2 days per family-will monitor.    LOS: 30 days    Elgie Collard 09/21/2018   Chart reviewed, patient examined, agree with above. Overall I think she is doing fairly well considering her preoperative condition.  Her vital signs have been very stable.  Echocardiogram shows significant improvement in her ejection fraction postoperatively.  Her renal function is stable and nephrology has signed off and will follow her up as an outpatient.  She is eating some and bowels have been moving.  GI has signed  off.  She is walking the halls with her family and I think she is ready to go home.  We will have the diabetes coordinator see her to make recommendations for her home insulin and will plan on her going home tomorrow.  I discussed all this with the patient and her son and daughter-in-law as translators.

## 2018-09-21 NOTE — Progress Notes (Signed)
Inpatient Diabetes Program Recommendations  AACE/ADA: New Consensus Statement on Inpatient Glycemic Control (2015)  Target Ranges:  Prepandial:   less than 140 mg/dL      Peak postprandial:   less than 180 mg/dL (1-2 hours)      Critically ill patients:  140 - 180 mg/dL   Lab Results  Component Value Date   GLUCAP 74 09/21/2018   HGBA1C 9.3 (H) 08/22/2018    Review of Glycemic ControlResults for SHENAE, BONANNO (MRN [id number]) as of 09/21/2018 11:56  Ref. Range 09/20/2018 05:57 09/20/2018 11:17 09/20/2018 16:34 09/20/2018 21:24 09/21/2018 06:10  Glucose-Capillary Latest Ref Range: 70 - 99 mg/dL 77 208 (H) 197 (H) 171 (H) 74   Diabetes history: DM2 Outpatient Diabetes medications: 70/30 35 units QAM, 70/30 25 units QPM Current orders for Inpatient glycemic control: Levemir 15 units bid, Novolog TCTS tid with meals and HS   Inpatient Diabetes Program Recommendations:  Note plans for D/C on 11/28.  Please d/c Levemir and start Novolog 70/30 15 units bid with meals (note that this is less than previous dose).  Will need f/u with PCP and likely will need adjustment based on blood sugars.    Thanks,  Adah Perl, RN, BC-ADM Inpatient Diabetes Coordinator Pager [phone] (8a-5p)

## 2018-09-21 NOTE — Progress Notes (Signed)
Initial Nutrition Assessment  DOCUMENTATION CODES:   Obesity unspecified  INTERVENTION:   -D/c Ensure Enlive po TID, each supplement provides 350 kcal and 20 grams of protein -Glucerna Shake po TID, each supplement provides 220 kcal and 10 grams of protein  NUTRITION DIAGNOSIS:   Inadequate oral intake related to early satiety, nausea as evidenced by meal completion < 25%.  Ongoing  GOAL:   Patient will meet greater than or equal to 90% of their needs  Progressing  MONITOR:   PO intake, Supplement acceptance, Labs, Weight trends, Skin, I & O's  REASON FOR ASSESSMENT:   Consult Assessment of nutrition requirement/status  ASSESSMENT:   67 yo Female with PMH of CAD s/p PCI to RCA, LCx, and LAD (most recent one in 01/2018), HTN, uncontrolled T2DM, and HLD who presented with one month of productive cough and 1 week of atypical chest pain.   Pt in good spirits at time of visit. Spoke mainly to pt's daughter in law at bedside. She reports that pt's appetite and food tolerance is improving, however, pt typically consumes small bites and sips ever hour to help promote intake. Pt consumed a bowl of oatmeal and Glucerna supplement already this morning. Noted meal completion 5-100%.   Pt daughter in law had many questions related to pt's glycemic control. She reports she was under the impression that pt's DM was well controlled, however, concerned about Hgb A1c of 9.3 (08/22/18). Pt was on 70/30 insulin BID PTA and is under the impression that pt will continue with home regimen at discharge (noted DM coordinator consult ordered today for home insulin recommendations). Typical intake PTA was Breakfast: 2 pieces of toast with tea, snack: fruit, Dinner: tortilla with vegetables and chicken). Discussed with pt and family that CBGS can be elevated in hospital due to acute stress response from illness and how DM regimen is more aggressive (different insulin regimen, more frequent CBGS checks) for  better DM control. Encouraged small, frequent meals to assist in increased oral intake. Also assured pt that home insulin regimen would be discussed with them at discharge.   Labs reviewed: Na:134, CBGS: 74-208 (inpatient orders for glycemic control are 0-24 units insulin aspart QID and 15 units insulin detemir BID).   Diet Order:   Diet Order            DIET SOFT Room service appropriate? Yes; Fluid consistency: Thin; Fluid restriction: 1500 mL Fluid  Diet effective now              EDUCATION NEEDS:   No education needs have been identified at this time  Skin:  Skin Assessment: Skin Integrity Issues: Skin Integrity Issues:: Incisions Incisions: closed chest, rt leg  Last BM:  09/19/18  Height:   Ht Readings from Last 1 Encounters:  09/06/18 5\' 1"  (1.549 m)    Weight:   Wt Readings from Last 1 Encounters:  09/21/18 74.4 kg    Ideal Body Weight:  47.7 kg  BMI:  Body mass index is 31.01 kg/m.  Estimated Nutritional Needs:   Kcal:  1800-2000  Protein:  90-105 gm  Fluid:  1.8-2.0 L    Laurann Mcmorris A. Jimmye Norman, RD, LDN, CDE Pager: [phone] After hours Pager: [phone]

## 2018-09-21 NOTE — Progress Notes (Signed)
CARDIAC REHAB PHASE I   PRE:  Rate/Rhythm: 78 SR bigeminy PVCs  BP:  Supine: 143/56  Sitting:   Standing:    SaO2: 96%RA  MODE:  Ambulation: 180 ft   POST:  Rate/Rhythm: 92 SR  BP:  Supine:   Sitting: 152/67  Standing:    SaO2: 95%RA 1050-1109 Pt walked 180 ft on RA with hand held asst. Could not get to go farther. Appeared SOB after walk but sats good on RA. To recliner with call bell. Gait steady. Family in room.   Graylon Good, RN BSN  09/21/2018 11:04 AM

## 2018-09-21 NOTE — Discharge Instructions (Signed)
Coronary Artery Bypass Grafting, Care After   This sheet gives you information about how to care for yourself after your procedure. Your health care provider may also give you more specific instructions. If you have problems or questions, contact your health care provider. What can I expect after the procedure? After the procedure, it is common to have:  Nausea and a lack of appetite.  Constipation.  Weakness and fatigue.  Depression or irritability.  Pain or discomfort in your incision areas.  Follow these instructions at home: Medicines  Take over-the-counter and prescription medicines only as told by your health care provider. Do not stop taking medicines or start any new medicines without approval from your health care provider.  If you were prescribed an antibiotic medicine, take it as told by your health care provider. Do not stop taking the antibiotic even if you start to feel better.  Do not drive or use heavy machinery while taking prescription pain medicine. Incision care  Follow instructions from your health care provider about how to take care of your incisions. Make sure you: ? Wash your hands with soap and water before you change your bandage (dressing). If soap and water are not available, use hand sanitizer. ? Change your dressing as told by your health care provider. ? Leave stitches (sutures), skin glue, or adhesive strips in place. These skin closures may need to stay in place for 2 weeks or longer. If adhesive strip edges start to loosen and curl up, you may trim the loose edges. Do not remove adhesive strips completely unless your health care provider tells you to do that.  Keep incision areas clean, dry, and protected.  Check your incision areas every day for signs of infection. Check for: ? More redness, swelling, or pain. ? More fluid or blood. ? Warmth. ? Pus or a bad smell.  If incisions were made in your legs: ? Avoid crossing your legs. ? Avoid  sitting for long periods of time. Change positions every 30 minutes. ? Raise (elevate) your legs when you are sitting. Bathing  Do not take baths, swim, or use a hot tub until your health care provider approves.  Only take sponge baths. Pat the incisions dry. Do not rub incisions with a washcloth or towel.  Ask your health care provider when you can shower. Eating and drinking  Eat foods that are high in fiber, such as raw fruits and vegetables, whole grains, beans, and nuts. Meats should be lean cut. Avoid canned, processed, and fried foods. This can help prevent constipation and is a recommended part of a heart-healthy diet.  Drink enough fluid to keep your urine clear or pale yellow.  Limit alcohol intake to no more than 1 drink a day for nonpregnant women and 2 drinks a day for men. One drink equals 12 oz of beer, 5 oz of wine, or 1 oz of hard liquor. Activity  Rest and limit your activity as told by your health care provider. You may be instructed to: ? Stop any activity right away if you have chest pain, shortness of breath, irregular heartbeats, or dizziness. Get help right away if you have any of these symptoms. ? Move around frequently for short periods or take short walks as directed by your health care provider. Gradually increase your activities. You may need physical therapy or cardiac rehabilitation to help strengthen your muscles and build your endurance. ? Avoid lifting, pushing, or pulling anything that is heavier than 10 lb (4.5 kg)  for at least 6 weeks or as told by your health care provider.  Do not drive until your health care provider approves.  Ask your health care provider when you may return to work.  Ask your health care provider when you may resume sexual activity. General instructions  Do not use any products that contain nicotine or tobacco, such as cigarettes and e-cigarettes. If you need help quitting, ask your health care provider.  Take 2-3 deep  breaths every few hours during the day, while you recover. This helps expand your lungs and prevent complications like pneumonia after surgery.  If you were given a device called an incentive spirometer, use it several times a day to practice deep breathing. Support your chest with a pillow or your arms when you take deep breaths or cough.  Wear compression stockings as told by your health care provider. These stockings help to prevent blood clots and reduce swelling in your legs.  Weigh yourself every day. This helps identify if your body is holding (retaining) fluid that may make your heart and lungs work harder.  Keep all follow-up visits as told by your health care provider. This is important. Contact a health care provider if:  You have more redness, swelling, or pain around any incision.  You have more fluid or blood coming from any incision.  Any incision feels warm to the touch.  You have pus or a bad smell coming from any incision  You have a fever.  You have swelling in your ankles or legs.  You have pain in your legs.  You gain 2 lb (0.9 kg) or more a day.  You are nauseous or you vomit.  You have diarrhea. Get help right away if:  You have chest pain that spreads to your jaw or arms.  You are short of breath.  You have a fast or irregular heartbeat.  You notice a "clicking" in your breastbone (sternum) when you move.  You have numbness or weakness in your arms or legs.  You feel dizzy or light-headed. Summary  After the procedure, it is common to have pain or discomfort in the incision areas.  Do not take baths, swim, or use a hot tub until your health care provider approves.  Gradually increase your activities. You may need physical therapy or cardiac rehabilitation to help strengthen your muscles and build your endurance.  Weigh yourself every day. This helps identify if your body is holding (retaining) fluid that may make your heart and lungs work  harder. This information is not intended to replace advice given to you by your health care provider. Make sure you discuss any questions you have with your health care provider. Document Released: 05/01/2005 Document Revised: 08/31/2016 Document Reviewed: 08/31/2016 Elsevier Interactive Patient Education  2018 [facility] Stay Proper nutrition can help your body recover from illness and injury.   Foods and beverages high in protein, vitamins, and minerals help rebuild muscle loss, promote healing, & reduce fall risk.   In addition to eating healthy foods, a nutrition shake is an easy, delicious way to get the nutrition you need during and after your hospital stay  It is recommended that you continue to drink 2 bottles per day of:       Ensure or Glucerna for at least 1 month (30 days) after your hospital stay   Tips for adding a nutrition shake into your routine: As allowed, drink one with vitamins or medications instead of  water or juice Enjoy one as a tasty mid-morning or afternoon snack Drink cold or make a milkshake out of it Drink one instead of milk with cereal or snacks Use as a coffee creamer   Available at the following grocery stores and pharmacies:           * [facility] [phone]            For COUPONS visit: www.ensure.com/join or http://dawson-may.com/   Suggested Substitutions Ensure Plus = Boost Plus = Carnation Breakfast Essentials = Boost Compact Ensure Active Clear = Boost Breeze Glucerna Shake = Boost Glucose Control = Carnation Breakfast Essentials SUGAR FREE

## 2018-09-22 LAB — GLUCOSE, CAPILLARY
GLUCOSE-CAPILLARY: 130 mg/dL — AB (ref 70–99)
Glucose-Capillary: 161 mg/dL — ABNORMAL HIGH (ref 70–99)
Glucose-Capillary: 137 mg/dL — ABNORMAL HIGH (ref 70–99)

## 2018-09-22 MED ORDER — INSULIN ASPART PROT & ASPART (70-30 MIX) 100 UNIT/ML ~~LOC~~ SUSP: 15.0000 [IU] | Freq: Two times a day (BID) | SUBCUTANEOUS | 11 refills | Status: DC

## 2018-09-22 MED ORDER — HYDRALAZINE HCL 50 MG PO TABS: 50.0000 mg | ORAL_TABLET | Freq: Three times a day (TID) | ORAL | 1 refills | Status: DC

## 2018-09-22 MED ORDER — SIMETHICONE 80 MG PO CHEW: 160.0000 mg | CHEWABLE_TABLET | Freq: Three times a day (TID) | ORAL | 0 refills | Status: AC

## 2018-09-22 NOTE — Progress Notes (Signed)
Patient in a stable condition, discharge education reviewed with patient using startus video interpretation and the patient's daughter, patient and family verbalized understanding, iv removed, tele dc ccmd notified, prescriptions given to patient,patient belongings at bedside patient to be transported home by her family

## 2018-09-22 NOTE — Progress Notes (Addendum)
[address]       [place] [id number]             [phone]      24 Days Post-Op Procedure(s) (LRB): CORONARY ARTERY BYPASS GRAFTING (CABG) x three, using left internal mammary artery and right leg greater saphenous vein harvested endoscopically (N/A) TRANSESOPHAGEAL ECHOCARDIOGRAM (TEE) (N/A) Subjective: Feels ok  Objective: Vital signs in last 24 hours: Temp:  [98.2 F (36.8 C)-99.7 F (37.6 C)] 98.6 F (37 C) (11/28 0401) Pulse Rate:  [74-96] 76 (11/28 0401) Cardiac Rhythm: Normal sinus rhythm (11/27 2011) Resp:  [14-21] 14 (11/28 0611) BP: (139-161)/(47-77) 155/47 (11/28 0401) SpO2:  [92 %-96 %] 92 % (11/28 0401) Weight:  [74 kg] 74 kg (11/28 0611)  Hemodynamic parameters for last 24 hours:    Intake/Output from previous day: 11/27 0701 - 11/28 0700 In: 220 [P.O.:220] Out: -  Intake/Output this shift: No intake/output data recorded.  General appearance: alert, cooperative and no distress Heart: regular rate and rhythm Lungs: mildly dim in bases Abdomen: benign Extremities: minor edema Wound: incis healing well  Lab Results: No results for input(s): WBC, HGB, HCT, PLT in the last 72 hours. BMET:  Recent Labs    09/20/18 0326 09/21/18 0340  NA 133* 134*  K 3.9 4.5  CL 100 101  CO2 24 25  GLUCOSE 67* 99  BUN 32* 32*  CREATININE 1.81* 2.05*  CALCIUM 9.0 9.1    PT/INR: No results for input(s): LABPROT, INR in the last 72 hours. ABG    Component Value Date/Time   PHART 7.342 (L) 08/30/2018 0133   HCO3 23.3 08/30/2018 0133   TCO2 25 08/30/2018 1523   ACIDBASEDEF 2.0 08/30/2018 0133   O2SAT 75.1 08/31/2018 0400   CBG (last 3)  Recent Labs    09/21/18 2134 09/22/18 0132 09/22/18 0645  GLUCAP 74 130* 161*    Meds Scheduled Meds: . amLODipine  10 mg Oral Daily  . aspirin EC  81 mg Oral Daily  . atorvastatin  80 mg Oral Daily  . budesonide  0.25 mg Inhalation BID  . carvedilol  25 mg Oral BID WC  . clopidogrel  75  mg Oral Daily  . enoxaparin (LOVENOX) injection  30 mg Subcutaneous QHS  . feeding supplement (GLUCERNA SHAKE)  237 mL Oral TID BM  . hydrALAZINE  50 mg Oral Q8H  . HYDROcodone-acetaminophen  2 tablet Oral Once  . insulin aspart  0-24 Units Subcutaneous TID AC & HS  . insulin aspart protamine- aspart  15 Units Subcutaneous BID WC  . metoCLOPramide (REGLAN) injection  5 mg Intravenous Q6H  . pantoprazole  40 mg Oral QAC breakfast  . polyethylene glycol  17 g Oral Daily  . simethicone  160 mg Oral TID AC  . sodium chloride flush  3 mL Intravenous Q12H   Continuous Infusions: . ferumoxytol Stopped (09/16/18 0958)   PRN Meds:.acetaminophen, bisacodyl, ondansetron **OR** ondansetron (ZOFRAN) IV, sodium chloride flush, traMADol  Xrays Dg Chest Port 1 View  Result Date: 09/20/2018 CLINICAL DATA:  Shortness of breath EXAM: PORTABLE CHEST 1 VIEW COMPARISON:  09/16/2018 FINDINGS: Cardiac shadow is enlarged but stable. Postsurgical changes are again seen. Small bilateral pleural effusions are noted slightly greater than that seen on the prior exam. No focal confluent infiltrate is seen. No bony abnormality is noted. IMPRESSION: Bilateral pleural effusions slightly increased from the prior study. Electronically Signed   By: Inez Catalina M.D.   On: 09/20/2018  08:16    Assessment/Plan: S/P Procedure(s) (LRB): CORONARY ARTERY BYPASS GRAFTING (CABG) x three, using left internal mammary artery and right leg greater saphenous vein harvested endoscopically (N/A) TRANSESOPHAGEAL ECHOCARDIOGRAM (TEE) (N/A)  1 doing well, stable for discharge 2 some htn- cont same meds, may need adjustment as outpatient 3 sats ok on RA 4 no new labs 5 UOP not recorded, no current diuretics 6 BS variable- will d/c on 70/30 15 u BID per recs of DM coordinator  LOS: 31 days    Joy Giovanni PA-C 09/22/2018 Pager [phone]  patient examined and medical record reviewed,agree with above note. Tharon Aquas Trigt  III 09/22/2018

## 2018-10-03 ENCOUNTER — Ambulatory Visit: Payer: Self-pay

## 2018-10-05 ENCOUNTER — Ambulatory Visit: Payer: Self-pay | Admitting: Cardiology

## 2018-10-05 NOTE — Progress Notes (Deleted)
Cardiology Office Note:    Date:  10/05/2018   ID:  Joy, Boyd Sep 05, 1951, MRN [id number]  PCP:  Patient, No Pcp Per  Cardiologist:  Mertie Moores, MD  Referring MD: No ref. provider found   No chief complaint on file. ***  History of Present Illness:    Joy Boyd is a 67 y.o. Joy Boyd female with a past medical history significant for poorly controlled diabetes, CKD, hypertension, hyperlipidemia, acute on chronic systolic and diastolic CHF and multivessel CAD S/P stents (7) 2013 in 2019.  The patient lives in [place] with her husband but travels frequently to the [place] to visit her son.    She was admitted 08/20/2018 to the hospital with chest tightness and increasing shortness of breath.  Dr. Acie Fredrickson consulted on the patient.  She was new to our practice.  She is followed by cardiology in [place].  Troponins were negative.  Echocardiogram on 08/21/2018 showed EF 15-20% with diffuse hypokinesis, grad 2 DD.  Cath on 08/23/2018 that showed distal left main disease, patent stents to the circumflex, LAD and RCA.  She underwent three-vessel coronary bypass grafting on 08/29/2018.  She required 2 unit of packed red blood cells but for postop anemia.  She had some AKI which improved and is being followed by nephrology.  She had some abdominal discomfort felt to be due to gastroparesis and improved with Reglan therapy and laxative.  Lasix and then Demadex was used for fluid removal.  She was finally discharged on 09/22/2018  Past Medical History:  Diagnosis Date  . Acute on chronic combined systolic and diastolic CHF (congestive heart failure) ([place]) 08/23/2018  . Coronary artery disease   . Diabetes mellitus type 2 in obese ([place]) 08/23/2018  . Diabetes mellitus without complication ([place])   . Hypertension   . Pure hypercholesterolemia 08/23/2018  . PVC (premature ventricular contraction) 08/23/2018    Past Surgical History:  Procedure Laterality Date  . CORONARY ARTERY  BYPASS GRAFT N/A 08/29/2018   Procedure: CORONARY ARTERY BYPASS GRAFTING (CABG) x three, using left internal mammary artery and right leg greater saphenous vein harvested endoscopically;  Surgeon: Gaye Pollack, MD;  Location: [facility];  Service: Open Heart Surgery;  Laterality: N/A;  . LEFT HEART CATH AND CORONARY ANGIOGRAPHY N/A 08/23/2018   Procedure: LEFT HEART CATH AND CORONARY ANGIOGRAPHY;  Surgeon: Leonie Man, MD;  Location: [facility];  Service: Cardiovascular;  Laterality: N/A;  . RIGHT HEART CATH N/A 08/25/2018   Procedure: RIGHT HEART CATH;  Surgeon: Belva Crome, MD;  Location: [facility];  Service: Cardiovascular;  Laterality: N/A;  . TEE WITHOUT CARDIOVERSION N/A 08/29/2018   Procedure: TRANSESOPHAGEAL ECHOCARDIOGRAM (TEE);  Surgeon: Gaye Pollack, MD;  Location: [facility];  Service: Open Heart Surgery;  Laterality: N/A;    Current Medications: No outpatient medications have been marked as taking for the 10/05/18 encounter (Appointment) with Daune Perch, NP.     Allergies:   Patient has no known allergies.   Social History   Socioeconomic History  . Marital status: Married    Spouse name: Not on file  . Number of children: Not on file  . Years of education: Not on file  . Highest education level: Not on file  Occupational History  . Not on file  Social Needs  . Financial resource strain: Not on file  . Food insecurity:    Worry: Not on file    Inability: Not on file  . Transportation needs:  Medical: Not on file    Non-medical: Not on file  Tobacco Use  . Smoking status: Never Smoker  . Smokeless tobacco: Never Used  Substance and Sexual Activity  . Alcohol use: Never    Frequency: Never  . Drug use: Never  . Sexual activity: Not on file  Lifestyle  . Physical activity:    Days per week: Not on file    Minutes per session: Not on file  . Stress: Not on file  Relationships  . Social connections:    Talks on phone: Not on file     Gets together: Not on file    Attends religious service: Not on file    Active member of club or organization: Not on file    Attends meetings of clubs or organizations: Not on file    Relationship status: Not on file  Other Topics Concern  . Not on file  Social History Narrative  . Not on file     Family History: The patient's ***family history includes CAD in her father; Hypertension in her father. ROS:   Please see the history of present illness.    *** All other systems reviewed and are negative.  EKGs/Labs/Other Studies Reviewed:    The following studies were reviewed today:  Echocardiogram 09/13/2018 ost CABG Study Conclusions - Left ventricle: The cavity size was normal. There was mild   concentric hypertrophy. Systolic function was normal. The   estimated ejection fraction was in the range of 50% to 55%.   Diffuse hypokinesis. Left ventricular diastolic function   parameters were normal. - Ventricular septum: Septal motion showed paradox. - Mitral valve: There was mild regurgitation. - Left atrium: The atrium was mildly dilated. - Tricuspid valve: There was moderate regurgitation. - Pulmonary arteries: Systolic pressure was mildly increased. PA   peak pressure: 35 mm Hg (S). - Pericardium, extracardiac: There was a left pleural effusion.  Impressions: - Since the last study on 08/21/2018 LVEF has improved from 20-25%   to 50-55% with paradoxical septal motion.  Right heart cath 08/25/2018  Hemodynamic findings consistent with mild pulmonary hypertension. Recorded pulmonary artery pressure 42 over 17 mmHg. Mean PA pressure 31 mmHg  Mean pulmonary capillary wedge pressure 19 mmHg.  Cardiac output 4.8 L/min; cardiac index 2.82 L/min/m   Left heart cath 08/23/2018 SUMMARY  Severe distal left main disease involving the ostium of both LAD and circumflex.  Mild in-stent restenosis in the LAD, circumflex and RCA stents with moderate disease beyond the stent in  the LAD.  Echocardiographically severe Cardiomyopathy with Ejection Fraction of roughly 20%.    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  Echocardiogram 08/21/2018  Pre CABG Study Conclusions - Left ventricle: The cavity size was at the upper limits of   normal. Wall thickness was increased in a pattern of mild LVH.   Systolic function was severely reduced. The estimated ejection   fraction was in the range of 15% to 20%. Diffuse hypokinesis.   Features are consistent with a pseudonormal left ventricular   filling pattern, with concomitant abnormal relaxation and   increased filling pressure (grade 2 diastolic dysfunction). - Mitral valve: There was mild regurgitation. Valve area by   pressure half-time: 1.95 cm^2. - Left atrium: The atrium was moderately dilated. - Right ventricle: Systolic function was mildly reduced. - Right atrium: The atrium was mildly to moderately dilated. - Pulmonary arteries: PA peak pressure: 34 mm Hg (S). -  Impressions: There is sevre global LV dysfunction in the setting   of very frequent PVCs. 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  Impressions: - There is sevre global LV dysfunction in the setting of very   frequent PVCs. 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  EKG:  EKG is *** ordered today.  The ekg ordered today demonstrates ***  Recent Labs: 09/10/2018: B Natriuretic Peptide 732.9 09/12/2018: Magnesium 1.9 09/13/2018: ALT 26; Hemoglobin 9.2; Platelets 340 09/15/2018: TSH 5.284 09/21/2018: BUN 32; Creatinine, Ser 2.05; Potassium 4.5; Sodium 134   Recent Lipid Panel    Component Value Date/Time   CHOL 116 08/24/2018 0930   TRIG 171 (H) 08/24/2018 0930   HDL 31 (L) 08/24/2018 0930   CHOLHDL 3.7 08/24/2018 0930   VLDL 34 08/24/2018 0930   LDLCALC 51 08/24/2018 0930    Physical Exam:    VS:  There were no vitals taken for this visit.    Wt Readings from Last 3  Encounters:  09/22/18 163 lb 2.3 oz (74 kg)     Physical Exam***   ASSESSMENT:    No diagnosis found. PLAN:    In order of problems listed above:  1.  CAD S/P CABG 08/29/2018 -History of CAD with multiple stents in the past.  Admitted to the hospital 08/20/2018 for chest pain.  Found to have distal left main stenosis.  Underwent CABG on 08/29/2018.  Discharge from the hospital 09/22/2018 -She continues on aspirin and Plavix for prior stents  2.  Ischemic cardiomyopathy -Echo 08/21/2018 EF 15-20%.  Echocardiogram 09/13/2018 post CABG EF 50-55% -Patient is on beta-blocker.  Not on a diuretic at time of discharge.  Not on an ACE inhibitor or ARB due to renal function -  3. Pulmonary hypertension -Mild pulmonary hypertension on right heart cath with mean PA pressure 31 mmHg  4.  Hypertension -Currently treated with amlodipine 10 mg, carvedilol 25 mg, hydralazine 50 mg 3 times daily  5.  Acute on CKD stage 3 -Serum creatinine up to max of 2.6 during hospitalization, 2.05 at last check check -Followed by nephrology during hospitalization with plan for follow-up outpatient -Patient was not on diuretics at time of discharge  6.  Anemia -Required 2 units of red blood cells during hospitalization post CABG -  Medication Adjustments/Labs and Tests Ordered: Current medicines are reviewed at length with the patient today.  Concerns regarding medicines are outlined above. Labs and tests ordered and medication changes are outlined in the patient instructions below:  There are no Patient Instructions on file for this visit.   Signed, Daune Perch, NP  10/05/2018 5:39 AM     [facility]

## 2018-10-06 ENCOUNTER — Encounter: Payer: Self-pay | Admitting: Cardiology

## 2018-10-10 ENCOUNTER — Other Ambulatory Visit: Payer: Self-pay | Admitting: Surgery

## 2018-10-10 ENCOUNTER — Telehealth (HOSPITAL_COMMUNITY): Payer: Self-pay

## 2018-10-10 ENCOUNTER — Ambulatory Visit (INDEPENDENT_AMBULATORY_CARE_PROVIDER_SITE_OTHER): Payer: Self-pay | Admitting: Physician Assistant

## 2018-10-10 ENCOUNTER — Ambulatory Visit
Admission: RE | Admit: 2018-10-10 | Discharge: 2018-10-10 | Disposition: A | Discharge status: Home / Self Care | Payer: Self-pay | Source: Ambulatory Visit | Attending: Surgery | Admitting: Surgery

## 2018-10-10 VITALS — BP 145/77 | HR 80 | Resp 20 | Ht 61.0 in | Wt 163.0 lb

## 2018-10-10 DIAGNOSIS — I2510 Atherosclerotic heart disease of native coronary artery without angina pectoris: Secondary | ICD-10-CM

## 2018-10-10 DIAGNOSIS — R1010 Upper abdominal pain, unspecified: Secondary | ICD-10-CM

## 2018-10-10 DIAGNOSIS — Z951 Presence of aortocoronary bypass graft: Secondary | ICD-10-CM

## 2018-10-10 MED ORDER — METOCLOPRAMIDE HCL 10 MG PO TABS: 10.0000 mg | ORAL_TABLET | Freq: Three times a day (TID) | ORAL | 1 refills | Status: DC

## 2018-10-10 NOTE — Telephone Encounter (Signed)
Pt will like to join the Cardiac Rehab Maintenance program. Placed white card in "maintenance referral" bin.

## 2018-10-10 NOTE — Progress Notes (Signed)
HPI:  Patient returns for routine postoperative follow-up having undergone CABG x 3 on 08/29/2018. The patient's early postoperative recovery while in the hospital was notable for renal insufficiency requiring Nephrology consult.  She developed persistent belching and abdominal pain. GI consult was obtained who felt she likely had gastroparesis due to her diabetes and she was treated with reglan.  No further workup was performed at that time.  Since hospital discharge the patient's son and daughter in law report the patient has not made much progress.  She is still dealing with constant belching and abdominal pain.  She is unable to eat and has only been taking in Ensure and Broth as she is afraid she will get sick with solid food.  They were not given reglan at discharge and no GI follow up was arranged.  She is also experiencing swelling of her legs that has been getting worse since hospital discharge.  She was not discharged on diuretics due to her renal issues.  She is ambulating per the family about 200 steps, however her activity has been hindered by the abdominal pain she is experiencing makes her not want to do much.  She is moving her bowels.  Current Outpatient Medications  Medication Sig Dispense Refill  . acetaminophen (TYLENOL) 325 MG tablet Take 2 tablets (650 mg total) by mouth every 6 (six) hours as needed for mild pain.    Marland Kitchen amLODipine (NORVASC) 10 MG tablet Take 1 tablet (10 mg total) by mouth daily. 60 tablet 1  . aspirin EC 81 MG EC tablet Take 1 tablet (81 mg total) by mouth daily. 30 tablet 1  . atorvastatin (LIPITOR) 80 MG tablet Take 1 tablet (80 mg total) by mouth daily. 60 tablet 1  . bisacodyl (DULCOLAX) 10 MG suppository Place 1 suppository (10 mg total) rectally daily as needed for moderate constipation. 12 suppository 0  . carvedilol (COREG) 25 MG tablet Take 1 tablet (25 mg total) by mouth 2 (two) times daily with a meal. 120 tablet 1  . clopidogrel (PLAVIX) 75 MG tablet  Take 75 mg by mouth daily.    Marland Kitchen docusate sodium (COLACE) 100 MG capsule Take 1 capsule (100 mg total) by mouth 2 (two) times daily. 10 capsule 0  . hydrALAZINE (APRESOLINE) 50 MG tablet Take 1 tablet (50 mg total) by mouth every 8 (eight) hours. 90 tablet 1  . insulin aspart protamine- aspart (NOVOLOG MIX 70/30) (70-30) 100 UNIT/ML injection Inject 0.15 mLs (15 Units total) into the skin 2 (two) times daily with a meal. If blood sugars over 200 , change to 20 units each time insulin given 10 mL 11  . simethicone (MYLICON) 80 MG chewable tablet Chew 2 tablets (160 mg total) by mouth 3 (three) times daily before meals. 30 tablet 0  . traMADol (ULTRAM) 50 MG tablet Take 1 tablet (50 mg total) by mouth every 12 (twelve) hours as needed for moderate pain. 30 tablet 0  . metoCLOPramide (REGLAN) 10 MG tablet Take 1 tablet (10 mg total) by mouth 4 (four) times daily -  before meals and at bedtime. 120 tablet 1  . senna (SENOKOT) 8.6 MG TABS tablet Take 1 tablet (8.6 mg total) by mouth 2 (two) times daily. (Patient not taking: Reported on 10/10/2018) 120 each 0   No current facility-administered medications for this visit.     Physical Exam:  BP (!) 145/77   Pulse 80   Resp 20   Ht 5\' 1"  (1.549 m)   Wt  163 lb (73.9 kg)   SpO2 94% Comment: RA  BMI 30.80 kg/m   Gen: no apparent distress Heart: RRR Lungs: CTA bilaterally Abd: soft, mild distention, good BS x4, she is tender to palpation across entire portion of upper abdomen Ext: significant edema of RLE, especially in calf region, her LLE has mild pitting edema Incisions: are well healed  Diagnostic Tests:  CXR: mild atelectasis, no significant effusion  A/P;  1. S/P CABG x 3-- doing okay, I do not think she is recovering as well as she could be due to underlying issues with abdomen. 2. Significant RLE swelling, worse in calf, left is non-tender, however patient is not very active currently, will get a venous duplex to ensure no DVT has  developed, would benefit from short course of lasix, will check BMET and if renal function is okay will start Lasix, she has not yet followed up with Nephrology and is scheduled to 1/16 3. Abdominal  Pain- this is patient's/family biggest complaint/concern.  This is hindering patient's ability for good oral intake.  The discomfort is causing the patient to not want to ambulate.... will restart Reglan, get complete ABD ultrasound, refer to GI for further workup.. 4. RTC on 1/8 for follow up with [facility], get venous duplex today if + would require admission to the hospital, check BMET if creatinine okay will start Lasix, F/U with nephrology as scheduled, get US abdomen, referral to GI for more formal workup as Abdominal symptoms still persist and are hindering patient's daily life and recovery from bypass surgery. Ellwood Handler, PA-C [facility] [phone]

## 2018-10-10 NOTE — Progress Notes (Signed)
re

## 2018-10-10 NOTE — Telephone Encounter (Signed)
Called and spoke to pt son Joy Boyd who stated pt does not have any insurance at this time. She has applied for Medicare and is awaiting a decision. Adv of our CR maintenance program, pt son stated he will like to sign his mother up for our CR maintenance program. Will pass information to our maintenance coordinator.  Closed referral

## 2018-10-11 ENCOUNTER — Other Ambulatory Visit: Payer: Self-pay

## 2018-10-11 ENCOUNTER — Other Ambulatory Visit (HOSPITAL_COMMUNITY): Payer: Self-pay | Admitting: Physician Assistant

## 2018-10-11 ENCOUNTER — Ambulatory Visit (HOSPITAL_COMMUNITY)
Admission: RE | Admit: 2018-10-11 | Discharge: 2018-10-11 | Disposition: A | Discharge status: Home / Self Care | Payer: Self-pay | Source: Ambulatory Visit | Attending: Vascular Surgery | Admitting: Vascular Surgery

## 2018-10-11 DIAGNOSIS — Z951 Presence of aortocoronary bypass graft: Secondary | ICD-10-CM | POA: Insufficient documentation

## 2018-10-11 MED ORDER — INSULIN NPH ISOPHANE & REGULAR (70-30) 100 UNIT/ML ~~LOC~~ SUSP: SUBCUTANEOUS | 11 refills | Status: DC

## 2018-10-11 MED ORDER — INSULIN NPH ISOPHANE & REGULAR (70-30) 100 UNIT/ML ~~LOC~~ SUSP: SUBCUTANEOUS | 11 refills | Status: AC

## 2018-10-12 ENCOUNTER — Other Ambulatory Visit: Payer: Self-pay | Admitting: Physician Assistant

## 2018-10-12 LAB — BASIC METABOLIC PANEL WITH GFR
BUN/Creatinine Ratio: 28 (calc) — ABNORMAL HIGH (ref 6–22)
BUN: 44 mg/dL — ABNORMAL HIGH (ref 7–25)
CO2: 19 mmol/L — ABNORMAL LOW (ref 20–32)
Calcium: 8.9 mg/dL (ref 8.6–10.4)
Chloride: 106 mmol/L (ref 98–110)
Creat: 1.55 mg/dL — ABNORMAL HIGH (ref 0.50–0.99)
Glucose, Bld: 166 mg/dL — ABNORMAL HIGH (ref 65–99)
Potassium: 5.2 mmol/L (ref 3.5–5.3)
Sodium: 133 mmol/L — ABNORMAL LOW (ref 135–146)

## 2018-10-12 MED ORDER — FUROSEMIDE 40 MG PO TABS: 40.0000 mg | ORAL_TABLET | Freq: Every day | ORAL | 1 refills | Status: DC

## 2018-10-12 MED ORDER — POTASSIUM CHLORIDE ER 10 MEQ PO TBCR: 10.0000 meq | EXTENDED_RELEASE_TABLET | Freq: Every day | ORAL | 1 refills | Status: DC

## 2018-10-12 NOTE — Progress Notes (Signed)
Patient underwent venous duplex which was negative for DVT.   BMET obtained and showed a creatinine level of 1.55.  This is about her baseline level.  She was quite edematous in the office.  I have spoken with Dr. Cyndia Bent who was in agreement she should have a course of Lasix.  She has been given a prescription for Lasix 40 mg daily, with Kdur at 10 meq daily.  This was sent to [facility]  Contacted patient and left voicemail with instructions to contact office with further questions.   Ellwood Handler, PA-C

## 2018-10-13 ENCOUNTER — Ambulatory Visit
Admission: RE | Admit: 2018-10-13 | Discharge: 2018-10-13 | Disposition: A | Discharge status: Home / Self Care | Payer: Self-pay | Source: Ambulatory Visit | Attending: Physician Assistant | Admitting: Physician Assistant

## 2018-10-13 DIAGNOSIS — R1010 Upper abdominal pain, unspecified: Secondary | ICD-10-CM

## 2018-10-14 ENCOUNTER — Telehealth: Payer: Self-pay | Admitting: Family Medicine

## 2018-10-14 ENCOUNTER — Telehealth: Payer: Self-pay

## 2018-10-14 ENCOUNTER — Ambulatory Visit: Payer: Self-pay | Admitting: Family Medicine

## 2018-10-14 ENCOUNTER — Encounter: Payer: Self-pay | Admitting: Family Medicine

## 2018-10-14 VITALS — BP 140/70 | HR 80 | Temp 98.5°F | Ht 61.0 in | Wt 163.0 lb

## 2018-10-14 DIAGNOSIS — Z951 Presence of aortocoronary bypass graft: Secondary | ICD-10-CM

## 2018-10-14 DIAGNOSIS — N179 Acute kidney failure, unspecified: Secondary | ICD-10-CM

## 2018-10-14 DIAGNOSIS — R638 Other symptoms and signs concerning food and fluid intake: Secondary | ICD-10-CM

## 2018-10-14 DIAGNOSIS — R6881 Early satiety: Secondary | ICD-10-CM

## 2018-10-14 DIAGNOSIS — R600 Localized edema: Secondary | ICD-10-CM

## 2018-10-14 DIAGNOSIS — R1011 Right upper quadrant pain: Secondary | ICD-10-CM

## 2018-10-14 DIAGNOSIS — Z09 Encounter for follow-up examination after completed treatment for conditions other than malignant neoplasm: Secondary | ICD-10-CM

## 2018-10-14 DIAGNOSIS — R110 Nausea: Secondary | ICD-10-CM

## 2018-10-14 DIAGNOSIS — R1013 Epigastric pain: Secondary | ICD-10-CM

## 2018-10-14 MED ORDER — ESOMEPRAZOLE MAGNESIUM 40 MG PO CPDR: 40.0000 mg | DELAYED_RELEASE_CAPSULE | Freq: Two times a day (BID) | ORAL | 1 refills | Status: AC

## 2018-10-14 MED ORDER — ERYTHROMYCIN BASE 250 MG PO TABS: 250.0000 mg | ORAL_TABLET | Freq: Three times a day (TID) | ORAL | 0 refills | Status: AC

## 2018-10-14 NOTE — Telephone Encounter (Signed)
-----   Message from [place], DO sent at 10/14/2018  1:25 PM EST ----- Regarding: ASAP consult Lesly Rubenstein, This is an ASAP consult that I'm hoping you could help plug in for an appt in the clinic next week with one of the APPs or if one of the docs has an opening. I am gone next week and inpatient the week after, so I cannot accommodate.  Consult was just sent in by her [facility] and patient requesting to change from [facility] to [facility] after recent hospital discharge.  Has active GI symptoms and certainly needs to see 1 of [place] in the very near future.  Thanks.

## 2018-10-14 NOTE — Patient Instructions (Addendum)
Take lasix 40mg  1 tab daily x 5 days along with the potassium supplement that was prescribed We will recheck her kidney function at appt with cardiothoracic surgery on 11/01/18 Keep legs elevated when possible Encourage walking  Take nexium 40mg  daily I will follow-up with GI on the referral, her symptoms, meds, and follow-up testing and get back to you later today

## 2018-10-14 NOTE — Telephone Encounter (Signed)
Thank you :)

## 2018-10-14 NOTE — Progress Notes (Signed)
Joy Boyd is a 67 y.o. female  Chief Complaint  Patient presents with  . Establish Care    pt had a CABG 08/29/18, needs a f/u . pt experiencing 3 weeks of abdominal pain and bloating     HPI: Joy Boyd is a 67 y.o. female here as a new patient to our office. She is accompanied by her son and daughter-in-law who translate as well as an interpretor who helps with translation. She is from [place] and lives there. She was here visiting her family for 2 mo and is now here while dealing with her recent medical issues.   Pt is s/p CABG x 3 on 08/29/2018. She had office f/u on 10/10/18 and was noted to have a few active issues at that time including abdominal pain, distention, and belching with limited PO intake and limited ambulation, as well as RLE edema.   She is moving her bowels regularly and is passing gas. She has very little appetite and when she does eat, she feels fulls and uncomfortable after a few bites of food. + nausea, no vomiting. Son notes she was on med in [place] for GERD and pt is now taking nexium PRN, but not consistently. She was on reglan in the hospital foe above symptoms and was told symptoms were likely d/t gastroparesis. It was believed they would improve and resolve with ambulation and time, but they have not. PO reglan is making pt very drowsy so she is not able to take as directed. She is drinking 1 Glucerna drink per day, about 32oz of water, and a few bites of food (oatmeal, tortilla/flat bread) a few times per day.  [place] abd done 10/13/18 - results below.   She was prescribed lasix yesterday for LE edema but has not taken a dose yet. She has nephro f/u scheduled d/t AKI that developed during her hospitalization.  She has f/u with CT surgery on 11/01/18, cardio on 11/04/18 and nephrology (d/t AKI during hospitalization for CABG) on 11/11/18.  She denies HA, CP, SOB, dizziness. No fever, chills. No urinary symptoms. No back pain.   Past Medical History:  Diagnosis  Date  . Acute on chronic combined systolic and diastolic CHF (congestive heart failure) ([place]) 08/23/2018  . Coronary artery disease   . Diabetes mellitus type 2 in obese ([place]) 08/23/2018  . Diabetes mellitus without complication ([place])   . Hypertension   . Pure hypercholesterolemia 08/23/2018  . PVC (premature ventricular contraction) 08/23/2018    Past Surgical History:  Procedure Laterality Date  . CORONARY ARTERY BYPASS GRAFT N/A 08/29/2018   Procedure: CORONARY ARTERY BYPASS GRAFTING (CABG) x three, using left internal mammary artery and right leg greater saphenous vein harvested endoscopically;  Surgeon: Gaye Pollack, MD;  Location: [facility];  Service: Open Heart Surgery;  Laterality: N/A;  . LEFT HEART CATH AND CORONARY ANGIOGRAPHY N/A 08/23/2018   Procedure: LEFT HEART CATH AND CORONARY ANGIOGRAPHY;  Surgeon: Leonie Man, MD;  Location: [facility];  Service: Cardiovascular;  Laterality: N/A;  . RIGHT HEART CATH N/A 08/25/2018   Procedure: RIGHT HEART CATH;  Surgeon: Belva Crome, MD;  Location: [facility];  Service: Cardiovascular;  Laterality: N/A;  . TEE WITHOUT CARDIOVERSION N/A 08/29/2018   Procedure: TRANSESOPHAGEAL ECHOCARDIOGRAM (TEE);  Surgeon: Gaye Pollack, MD;  Location: [facility];  Service: Open Heart Surgery;  Laterality: N/A;    Social History   Socioeconomic History  . Marital status: Married    Spouse  name: Not on file  . Number of children: Not on file  . Years of education: Not on file  . Highest education level: Not on file  Occupational History  . Not on file  Social Needs  . Financial resource strain: Not on file  . Food insecurity:    Worry: Not on file    Inability: Not on file  . Transportation needs:    Medical: Not on file    Non-medical: Not on file  Tobacco Use  . Smoking status: Never Smoker  . Smokeless tobacco: Never Used  Substance and Sexual Activity  . Alcohol use: Never    Frequency: Never  . Drug use: Never  .  Sexual activity: Not on file  Lifestyle  . Physical activity:    Days per week: Not on file    Minutes per session: Not on file  . Stress: Not on file  Relationships  . Social connections:    Talks on phone: Not on file    Gets together: Not on file    Attends religious service: Not on file    Active member of club or organization: Not on file    Attends meetings of clubs or organizations: Not on file    Relationship status: Not on file  . Intimate partner violence:    Fear of current or ex partner: Not on file    Emotionally abused: Not on file    Physically abused: Not on file    Forced sexual activity: Not on file  Other Topics Concern  . Not on file  Social History Narrative  . Not on file    Family History  Problem Relation Age of Onset  . CAD Father   . Hypertension Father      Immunization History  Administered Date(s) Administered  . Influenza, High Dose Seasonal PF 09/16/2018  . Pneumococcal Polysaccharide-23 09/16/2018    Outpatient Encounter Medications as of 10/14/2018  Medication Sig  . acetaminophen (TYLENOL) 325 MG tablet Take 2 tablets (650 mg total) by mouth every 6 (six) hours as needed for mild pain.  Marland Kitchen amLODipine (NORVASC) 10 MG tablet Take 1 tablet (10 mg total) by mouth daily.  Marland Kitchen aspirin EC 81 MG EC tablet Take 1 tablet (81 mg total) by mouth daily.  Marland Kitchen atorvastatin (LIPITOR) 80 MG tablet Take 1 tablet (80 mg total) by mouth daily.  . bisacodyl (DULCOLAX) 10 MG suppository Place 1 suppository (10 mg total) rectally daily as needed for moderate constipation.  . carvedilol (COREG) 25 MG tablet Take 1 tablet (25 mg total) by mouth 2 (two) times daily with a meal.  . clopidogrel (PLAVIX) 75 MG tablet Take 75 mg by mouth daily.  Marland Kitchen docusate sodium (COLACE) 100 MG capsule Take 1 capsule (100 mg total) by mouth 2 (two) times daily.  . furosemide (LASIX) 40 MG tablet Take 1 tablet (40 mg total) by mouth daily.  . hydrALAZINE (APRESOLINE) 50 MG tablet Take 1  tablet (50 mg total) by mouth every 8 (eight) hours.  . insulin NPH-regular Human (NOVOLIN 70/30) (70-30) 100 UNIT/ML injection Inject 0.15 mLs (15 Units total) into the skin 2 (two) times daily with a meal. If blood sugars over 200 , change to 20 units each time insulin given - Subcutaneous  . metoCLOPramide (REGLAN) 10 MG tablet Take 1 tablet (10 mg total) by mouth 4 (four) times daily -  before meals and at bedtime.  . potassium chloride (K-DUR) 10 MEQ tablet Take 1 tablet (  10 mEq total) by mouth daily.  Marland Kitchen senna (SENOKOT) 8.6 MG TABS tablet Take 1 tablet (8.6 mg total) by mouth 2 (two) times daily.  . simethicone (MYLICON) 80 MG chewable tablet Chew 2 tablets (160 mg total) by mouth 3 (three) times daily before meals.  . traMADol (ULTRAM) 50 MG tablet Take 1 tablet (50 mg total) by mouth every 12 (twelve) hours as needed for moderate pain.   No facility-administered encounter medications on file as of 10/14/2018.      ROS: Pertinent positives and negatives noted in HPI. Remainder of ROS non-contributor   No Known Allergies  BP 140/70 (BP Location: Left Arm, Patient Position: Sitting, Cuff Size: Normal)   Pulse 80   Temp 98.5 F (36.9 C) (Oral)   Ht 5\' 1"  (1.549 m)   Wt 163 lb (73.9 kg)   SpO2 94%   BMI 30.80 kg/m   Wt Readings from Last 3 Encounters:  10/14/18 163 lb (73.9 kg)  10/10/18 163 lb (73.9 kg)  09/22/18 163 lb 2.3 oz (74 kg)     Physical Exam  Constitutional: She appears well-developed. No distress.  Pt appears fatigued but non-toxic   Neck: Neck supple. No JVD present. No thyromegaly present.  Cardiovascular: Normal rate, regular rhythm and intact distal pulses.  Pulmonary/Chest: Effort normal and breath sounds normal. No respiratory distress.  Abdominal: Soft. She exhibits distension. She exhibits no fluid wave, no ascites and no mass. Bowel sounds are decreased. There is no hepatosplenomegaly. There is abdominal tenderness in the right upper quadrant and  epigastric area. There is no rebound, no guarding and no CVA tenderness.  Musculoskeletal: Normal range of motion.        General: Edema (+1 B/L LE edema) present.  Lymphadenopathy:    She has no cervical adenopathy.  Skin: Skin is warm and dry. She is not diaphoretic.     CLINICAL DATA:  Loss of appetite with nausea and vomiting  EXAM: ABDOMEN ULTRASOUND COMPLETE  FINDINGS: Gallbladder: No shadowing stones. Increased wall thickness at 6.1 mm. Negative sonographic Murphy.  Common bile duct: Diameter: Measures up to 7 mm  Liver: Slightly echogenic. No focal hepatic abnormality. Portal vein is patent on color Doppler imaging with normal direction of blood flow towards the liver.  IVC: No abnormality visualized.  Pancreas: Visualized portion unremarkable.  Spleen: Size and appearance within normal limits.  Right Kidney: Length: 11 cm. Echogenicity within normal limits. No mass or hydronephrosis visualized.  Left Kidney: Length: 10.6 cm. Echogenicity within normal limits. No mass or hydronephrosis visualized.  Abdominal aorta: No aneurysm visualized.  Other findings: Bilateral pleural effusions.  IMPRESSION: 1. Negative for gallstone. Gallbladder wall thickening which is nonspecific but could be seen in the setting of acute or chronic cholecystitis, liver disease, or edema forming states. Further evaluation with HIDA scan could be obtained as clinically indicated 2. Enlarged extrahepatic common bile duct up to 7 mm. Correlate with LFT. Follow-up MRCP as indicated 3. Bilateral pleural effusions.     A/P:  1. Hospital discharge follow-up  2. Right upper quadrant abdominal pain 3. Epigastric abdominal pain 4. Early satiety 5. Nausea 6. Decreased oral intake - Ambulatory referral to Gastroenterology - pt was seen during hospitalizations by [facility] but would like to see [facility] going forward. Referral placed and discussed case via phone with Dr. Gerrit Heck. He recommended: - esomeprazole (NEXIUM) 40 MG capsule; Take 1 capsule (40 mg total) by mouth 2 (two) times daily.  Dispense: 60 capsule;  Refill: 1 - pt has this med in 20mg  tabs at home so will take 2 20mg  tabs (40mg  total) BID until she runs out and then pick up Rx - erythromycin (E-MYCIN) 250 MG tablet; Take 1 tablet (250 mg total) by mouth 3 (three) times daily for 5 days.  Dispense: 15 tablet; Refill: 0 - pt is not able to tolerate reglan d/t drowsiness - expedited outpatient workup to likely include EGD. [facility] will call pt to schedule appt for next week - labs ordered to include CMP, prealbumin - if symptoms worsen, it is recommended pt go/be taken to ER - discussed this in detail with pts daughter-in-law who expressed understanding and was in agreement with plan  7. S/P CABG x 3 - cont current meds and f/u as scheduled with cardio and CT surgery  8. Bilateral leg edema - lasix 40mg  po daily x 5 days then PRN - keep legs elevated - ambulate  9. AKI (acute kidney injury) ([place]) - pt will take lasix 40mg  daily x 5 days then will stop and order placed to have BMP checked at [facility] in 11/01/18 - pt also has f/u with nephro scheduled for 11/11/18  I spent 60 min with the patient today and greater than 50% was spent in counseling, coordination of care, education

## 2018-10-14 NOTE — Telephone Encounter (Signed)
Copied from [place] [id number]. Topic: General - Other >> Oct 14, 2018  5:10 PM Keene Breath wrote: Reason for CRM: Patient called to request a script for the Protonix that the doctor discussed as the alternative for the Nexium.  Please call it into her pharmacy as soon as possible.  CB# [phone]

## 2018-10-14 NOTE — Telephone Encounter (Signed)
Called and spoke with patient's son-informed him of appointment made for patient being on 10/21/18 arrival at 2:00pm per Dr. Vivia Ewing recommendations to have patient be seen next week for =Hospital discharge follow-up of RUQ abdominal pain/Epigastric abdominal pain/Early satiety/Nausea/Decreased oral intake; patient's son verbalized understanding of information/instructions; patient's son also advised to call back if questions/concerns arise;

## 2018-10-17 ENCOUNTER — Ambulatory Visit: Payer: Self-pay

## 2018-10-18 NOTE — Telephone Encounter (Signed)
Dr.C please advise

## 2018-10-21 ENCOUNTER — Encounter: Payer: Self-pay | Admitting: Nurse Practitioner

## 2018-10-21 ENCOUNTER — Ambulatory Visit (INDEPENDENT_AMBULATORY_CARE_PROVIDER_SITE_OTHER): Payer: Self-pay | Admitting: Nurse Practitioner

## 2018-10-21 ENCOUNTER — Other Ambulatory Visit (INDEPENDENT_AMBULATORY_CARE_PROVIDER_SITE_OTHER): Payer: Self-pay

## 2018-10-21 VITALS — BP 130/60 | HR 71 | Ht 58.5 in | Wt 155.0 lb

## 2018-10-21 DIAGNOSIS — K839 Disease of biliary tract, unspecified: Secondary | ICD-10-CM

## 2018-10-21 DIAGNOSIS — R6881 Early satiety: Secondary | ICD-10-CM

## 2018-10-21 DIAGNOSIS — R140 Abdominal distension (gaseous): Secondary | ICD-10-CM

## 2018-10-21 LAB — HEPATIC FUNCTION PANEL
ALT: 30 U/L (ref 0–35)
AST: 20 U/L (ref 0–37)
Albumin: 3.7 g/dL (ref 3.5–5.2)
Alkaline Phosphatase: 127 U/L — ABNORMAL HIGH (ref 39–117)
Bilirubin, Direct: 0.1 mg/dL (ref 0.0–0.3)
TOTAL PROTEIN: 6.7 g/dL (ref 6.0–8.3)
Total Bilirubin: 0.4 mg/dL (ref 0.2–1.2)

## 2018-10-21 MED ORDER — PANTOPRAZOLE SODIUM 40 MG PO TBEC: 40.0000 mg | DELAYED_RELEASE_TABLET | Freq: Two times a day (BID) | ORAL | 2 refills | Status: DC

## 2018-10-21 NOTE — Patient Instructions (Signed)
If you are age 64 or older, your body mass index should be between 23-30. Your Body mass index is 31.84 kg/m. If this is out of the aforementioned range listed, please consider follow up with your Primary Care Provider.  If you are age 1 or younger, your body mass index should be between 19-25. Your Body mass index is 31.84 kg/m. If this is out of the aformentioned range listed, please consider follow up with your Primary Care Provider.   Your provider has requested that you go to the basement level for lab work before leaving today. Press "B" on the elevator. The lab is located at the first door on the left as you exit the elevator. LFT  We will call you with results.  Thank you for choosing me and [facility].   Tye Savoy, NP

## 2018-10-21 NOTE — Progress Notes (Signed)
Chief Complaint:    Abdominal pain / bloating  IMPRESSION and PLAN:     1. [name] from [place] referred for RUQ pain,  bloating, and early satiety and initially constipation as well. Symptoms started following CABG in early November. Her symptoms have totally resolved, patient / family correlates resolution of symptoms with diuretic therapy.  -Not clear why symptoms resolved with lasix. I don't think she has a history of ascites and none seen on u/s in mid December. She couldn't tolerate Reglan due to drowsiness so PCP recently gave her a 5 day trial of erythromycin for presumed diabetic gastroparesis and I wonder if this is what actually helped her symptoms as she could have delayed gastric emptying. . Additionally constipation has resolved and that likely helped as well.  -U/S did show some non-specific gallbladder wall thickening without pericholecystic fluid. No cholelithiasis. CBD mildly enlarged at 7 mm. Given resolution of the RUQ pain and absence of tenderness on exam I will hold off on any further gallbladder workup for now.  -Given CBD dilation will check liver tests as none were obtained in the hospital.  -If symptoms recur, we are happy to see her back at any time. Patient lives in [place] but frequents [place] visit family  2. RLE edema, Daughter-in-law says the swelling has actually improved on lasix. No DVT on imaging.     HPI:     Patient is a 67 yo non English speaking female from [place] with DM2, CKD, HTN, chronic systolic and diastolic heart failure and CAD s/p multiple stents in [place]. She underwent CABG at [facility] in early November. Post-op course was complicated by anemia for which she received a unit of blood.in the OR followed later by another unit for persistently declining hemoglobin.  During that admission [facility] saw her for evaluation of abdominal pain / bloating and constipation. Abdominal film was negative. Symptoms reportedly  improved with laxatives, simethicone and reglan ( ? Diabetic gastroparesis). Patient had Cardiothoracic Surgery follow up on 12/17. She reported ongoing belching / abdominal pain.  Apparently she had not been prescribed Reglan at time of discharge so this was resumed, abdominal ultrasound ordered and GI referral.made. Due to significant RLE edema patient was started on lasix  The u/s was negative for gallstones. There was non-specific wall thickening and CBD was enlarged at 7 mm.    Patient was seen by PCP Joy Median, DO on 10/14/18. For ongoing boating / belching / early satiety she was started on BID PPI.  Patient had been unable to tolerate Reglan due to drowsiness so given trial of erythromycin x 5 days.   She wanted to establish care with [facility] so referral made.  Patient is accompanied by her Daughter-in-law who translate / interprets. Per Daughter-in-law the abdominal pain / bloating / early satiety have totally resolved. Pain resolved after PCP prescribed lasix. Patient was discharged from hospital on a low residue diet but she is now tolerating a regular diet.Her bowels are moving normally, for the most part without use of any laxatives. Her wight is down several pounds but probably due to the lasix. Patient / family concerned that GI symptoms may recur if / once lasix is stopped. Patient will be returning home to [place] soon. .              Review of systems:     All systems reviewed and negative except where noted in HPI.  Past Medical History:  Diagnosis Date  . Acute on chronic combined systolic and diastolic CHF (congestive heart failure) ([place]) 08/23/2018  . Coronary artery disease   . Diabetes mellitus type 2 in obese ([place]) 08/23/2018  . Diabetes mellitus without complication ([place])   . Hypertension   . Pure hypercholesterolemia 08/23/2018  . PVC (premature ventricular contraction) 08/23/2018   Past Surgical History:  Procedure Laterality Date  . CORONARY ARTERY BYPASS  GRAFT N/A 08/29/2018   Procedure: CORONARY ARTERY BYPASS GRAFTING (CABG) x three, using left internal mammary artery and right leg greater saphenous vein harvested endoscopically;  Surgeon: Joy Pollack, MD;  Location: [facility];  Service: Open Heart Surgery;  Laterality: N/A;  . LEFT HEART CATH AND CORONARY ANGIOGRAPHY N/A 08/23/2018   Procedure: LEFT HEART CATH AND CORONARY ANGIOGRAPHY;  Surgeon: Joy Man, MD;  Location: [facility];  Service: Cardiovascular;  Laterality: N/A;  . RIGHT HEART CATH N/A 08/25/2018   Procedure: RIGHT HEART CATH;  Surgeon: Joy Crome, MD;  Location: [facility];  Service: Cardiovascular;  Laterality: N/A;  . TEE WITHOUT CARDIOVERSION N/A 08/29/2018   Procedure: TRANSESOPHAGEAL ECHOCARDIOGRAM (TEE);  Surgeon: Joy Pollack, MD;  Location: [facility];  Service: Open Heart Surgery;  Laterality: N/A;    Family History  Problem Relation Age of Onset  . CAD Father   . Hypertension Father   . Diabetes Sister   . Diabetes Brother      Serum creatinine: 1.55 mg/dL (H) 10/11/18 1421 Estimated creatinine clearance: 30.5 mL/min (A)  Current Outpatient Medications  Medication Sig Dispense Refill  . acetaminophen (TYLENOL) 325 MG tablet Take 2 tablets (650 mg total) by mouth every 6 (six) hours as needed for mild pain.    Marland Kitchen amLODipine (NORVASC) 10 MG tablet Take 1 tablet (10 mg total) by mouth daily. 60 tablet 1  . aspirin EC 81 MG EC tablet Take 1 tablet (81 mg total) by mouth daily. 30 tablet 1  . atorvastatin (LIPITOR) 80 MG tablet Take 1 tablet (80 mg total) by mouth daily. 60 tablet 1  . bisacodyl (DULCOLAX) 10 MG suppository Place 1 suppository (10 mg total) rectally daily as needed for moderate constipation. 12 suppository 0  . carvedilol (COREG) 25 MG tablet Take 1 tablet (25 mg total) by mouth 2 (two) times daily with a meal. 120 tablet 1  . clopidogrel (PLAVIX) 75 MG tablet Take 75 mg by mouth daily.    Marland Kitchen docusate sodium (COLACE) 100 MG  capsule Take 1 capsule (100 mg total) by mouth 2 (two) times daily. 10 capsule 0  . esomeprazole (NEXIUM) 40 MG capsule Take 1 capsule (40 mg total) by mouth 2 (two) times daily. 60 capsule 1  . hydrALAZINE (APRESOLINE) 50 MG tablet Take 1 tablet (50 mg total) by mouth every 8 (eight) hours. 90 tablet 1  . insulin NPH-regular Human (NOVOLIN 70/30) (70-30) 100 UNIT/ML injection Inject 0.15 mLs (15 Units total) into the skin 2 (two) times daily with a meal. If blood sugars over 200 , change to 20 units each time insulin given - Subcutaneous 10 mL 11  . simethicone (MYLICON) 80 MG chewable tablet Chew 2 tablets (160 mg total) by mouth 3 (three) times daily before meals. 30 tablet 0   No current facility-administered medications for this visit.     Physical Exam:     BP 130/60   Pulse 71   Ht 4' 10.5" (1.486 m)   Wt 155 lb (70.3  kg)   BMI 31.84 kg/m   GENERAL:  Pleasant female in NAD PSYCH: : Cooperative, normal affect EENT:  conjunctiva pink, mucous membranes moist, neck supple without masses CARDIAC:  RRR, no murmur heard, significant amount of RLE edema.  PULM: Normal respiratory effort, lungs CTA bilaterally, no wheezing ABDOMEN:  Nondistended, soft, nontender. No obvious masses,  normal bowel sounds SKIN:  turgor, no lesions seen Musculoskeletal:  Normal muscle tone, normal strength NEURO: Alert and oriented x 3, no focal neurologic deficits   Tye Savoy , NP 10/21/2018, 2:27 PM   Cc: Harriett Sine, DO

## 2018-10-21 NOTE — Telephone Encounter (Signed)
Rx sent to pharm

## 2018-10-26 ENCOUNTER — Encounter: Payer: Self-pay | Admitting: Nurse Practitioner

## 2018-10-27 ENCOUNTER — Ambulatory Visit: Payer: Self-pay

## 2018-10-27 NOTE — Telephone Encounter (Signed)
Patient's daughter called and says the patient is taking Nexium OTC and on the box it says not to take it twice a day and for more than 14 days, so her question is it ok to take twice a day as Dr. Bryan Lemma ordered. I advised that according to the prescription sent on 10/14/18, take 40 mg BID #60/1 refill, which is a 1 month supply with 1 refill. She says that it was cheaper to buy OTC, so the patient is taking 20 mg 2 tabs BID. She asks when should she stop taking it, because she has an appointment coming up with the cardiologist. I advised to take as prescribed for the 2 months, she says she will see what the cardiologist says if she needs to continue it, then she will.   ----- Message from [facility] sent at 10/27/2018 10:43 AM EST ----- Pt daughter-in-law Joy Boyd called in with questions about the esomeprazole ([place]) 40 MG capsule. Joy Boyd requests a call back to discuss questions she have as well as how pt should be taking the medication. Cb# [phone]

## 2018-10-27 NOTE — Telephone Encounter (Signed)
Patient's daughter in law called, left VM to return call to the office.

## 2018-10-27 NOTE — Progress Notes (Signed)
Agree with Ms. Guenther's assessment and plan. Resolution of index sxs with recent trial of E-mycin and high dose acid suppression therapy along with Lasix. CBD just above ULN accounting for age. Can consider repeat RUQ [place] in 6-12 month, but in the absence of ongoing sxs and if normal LAEs, unlikely to be of pathologic significance. Appreciate the expedited appointment accomodation by MP Guenther.   Criston Chancellor, DO, [facility]

## 2018-10-31 ENCOUNTER — Encounter (HOSPITAL_COMMUNITY)
Admission: RE | Admit: 2018-10-31 | Discharge: 2018-10-31 | Disposition: A | Discharge status: Home / Self Care | Payer: Self-pay | Source: Ambulatory Visit | Attending: Cardiology | Admitting: Cardiology

## 2018-10-31 DIAGNOSIS — Z951 Presence of aortocoronary bypass graft: Secondary | ICD-10-CM | POA: Insufficient documentation

## 2018-10-31 DIAGNOSIS — N179 Acute kidney failure, unspecified: Secondary | ICD-10-CM | POA: Insufficient documentation

## 2018-10-31 DIAGNOSIS — I5043 Acute on chronic combined systolic (congestive) and diastolic (congestive) heart failure: Secondary | ICD-10-CM | POA: Insufficient documentation

## 2018-10-31 DIAGNOSIS — I255 Ischemic cardiomyopathy: Secondary | ICD-10-CM | POA: Insufficient documentation

## 2018-10-31 DIAGNOSIS — Z7902 Long term (current) use of antithrombotics/antiplatelets: Secondary | ICD-10-CM | POA: Insufficient documentation

## 2018-10-31 DIAGNOSIS — Z79899 Other long term (current) drug therapy: Secondary | ICD-10-CM | POA: Insufficient documentation

## 2018-10-31 DIAGNOSIS — Z794 Long term (current) use of insulin: Secondary | ICD-10-CM | POA: Insufficient documentation

## 2018-10-31 DIAGNOSIS — D509 Iron deficiency anemia, unspecified: Secondary | ICD-10-CM | POA: Insufficient documentation

## 2018-10-31 DIAGNOSIS — E871 Hypo-osmolality and hyponatremia: Secondary | ICD-10-CM | POA: Insufficient documentation

## 2018-10-31 DIAGNOSIS — I130 Hypertensive heart and chronic kidney disease with heart failure and stage 1 through stage 4 chronic kidney disease, or unspecified chronic kidney disease: Secondary | ICD-10-CM | POA: Insufficient documentation

## 2018-10-31 DIAGNOSIS — N184 Chronic kidney disease, stage 4 (severe): Secondary | ICD-10-CM | POA: Insufficient documentation

## 2018-10-31 DIAGNOSIS — I6523 Occlusion and stenosis of bilateral carotid arteries: Secondary | ICD-10-CM | POA: Insufficient documentation

## 2018-10-31 DIAGNOSIS — Z7982 Long term (current) use of aspirin: Secondary | ICD-10-CM | POA: Insufficient documentation

## 2018-10-31 DIAGNOSIS — K219 Gastro-esophageal reflux disease without esophagitis: Secondary | ICD-10-CM | POA: Insufficient documentation

## 2018-10-31 DIAGNOSIS — E669 Obesity, unspecified: Secondary | ICD-10-CM | POA: Insufficient documentation

## 2018-10-31 DIAGNOSIS — I493 Ventricular premature depolarization: Secondary | ICD-10-CM | POA: Insufficient documentation

## 2018-10-31 DIAGNOSIS — Z6832 Body mass index (BMI) 32.0-32.9, adult: Secondary | ICD-10-CM | POA: Insufficient documentation

## 2018-10-31 DIAGNOSIS — E782 Mixed hyperlipidemia: Secondary | ICD-10-CM | POA: Insufficient documentation

## 2018-10-31 DIAGNOSIS — E1122 Type 2 diabetes mellitus with diabetic chronic kidney disease: Secondary | ICD-10-CM | POA: Insufficient documentation

## 2018-10-31 DIAGNOSIS — I2510 Atherosclerotic heart disease of native coronary artery without angina pectoris: Secondary | ICD-10-CM | POA: Insufficient documentation

## 2018-10-31 DIAGNOSIS — I340 Nonrheumatic mitral (valve) insufficiency: Secondary | ICD-10-CM | POA: Insufficient documentation

## 2018-10-31 DIAGNOSIS — E1169 Type 2 diabetes mellitus with other specified complication: Secondary | ICD-10-CM | POA: Insufficient documentation

## 2018-11-01 ENCOUNTER — Ambulatory Visit (INDEPENDENT_AMBULATORY_CARE_PROVIDER_SITE_OTHER): Payer: Self-pay | Admitting: Physician Assistant

## 2018-11-01 ENCOUNTER — Other Ambulatory Visit: Payer: Self-pay

## 2018-11-01 VITALS — BP 140/68 | HR 84 | Resp 16 | Ht 58.5 in | Wt 158.6 lb

## 2018-11-01 DIAGNOSIS — Z951 Presence of aortocoronary bypass graft: Secondary | ICD-10-CM

## 2018-11-01 DIAGNOSIS — I2510 Atherosclerotic heart disease of native coronary artery without angina pectoris: Secondary | ICD-10-CM

## 2018-11-01 NOTE — Patient Instructions (Signed)
Take Lasix 40 mg daily for 5 days, then monitor patient's weight and please administer Lasix 40 mg daily prn for weight gain of 3lbs

## 2018-11-01 NOTE — Progress Notes (Signed)
HPI:  Patient returns for follow up S/P CABG x 3 on 08/29/2018.  She was last evaluated at her post operative visit on 10/12/2018.  At that time the patient was not progressing very well.  However, this was not due to surgery.  She was experiencing significant issues with her GI tract that had been present since prior to her discharge.  She also had some issues with swelling of her LE.  She was prescribed lasix for this.  She was also referred to establish care with a family medicine provider as well as evaluation with Gastroenterology.  The patient is again accompanied by her son.  She states she has been doing so much better.  They state the Nexium and Lasix significantly improved her abdominal pain and she is now able to tolerate oral intake.  She did not take the Erythromycin that was prescribed by her PCP.  They state she has been having some episodes of low blood sugar at night and the son has been giving her a reduced dose of insulin.  She is ambulating without much difficulty.  She still gets winded at times.  She is due to start cardiac rehabilitation tomorrow.  Current Outpatient Medications  Medication Sig Dispense Refill  . acetaminophen (TYLENOL) 325 MG tablet Take 2 tablets (650 mg total) by mouth every 6 (six) hours as needed for mild pain.    Marland Kitchen amLODipine (NORVASC) 10 MG tablet Take 1 tablet (10 mg total) by mouth daily. 60 tablet 1  . aspirin EC 81 MG EC tablet Take 1 tablet (81 mg total) by mouth daily. 30 tablet 1  . atorvastatin (LIPITOR) 80 MG tablet Take 1 tablet (80 mg total) by mouth daily. 60 tablet 1  . bisacodyl (DULCOLAX) 10 MG suppository Place 1 suppository (10 mg total) rectally daily as needed for moderate constipation. 12 suppository 0  . carvedilol (COREG) 25 MG tablet Take 1 tablet (25 mg total) by mouth 2 (two) times daily with a meal. 120 tablet 1  . clopidogrel (PLAVIX) 75 MG tablet Take 75 mg by mouth daily.    Marland Kitchen docusate sodium (COLACE) 100 MG capsule Take 1  capsule (100 mg total) by mouth 2 (two) times daily. 10 capsule 0  . esomeprazole (NEXIUM) 40 MG capsule Take 1 capsule (40 mg total) by mouth 2 (two) times daily. 60 capsule 1  . hydrALAZINE (APRESOLINE) 50 MG tablet Take 1 tablet (50 mg total) by mouth every 8 (eight) hours. 90 tablet 1  . insulin NPH-regular Human (NOVOLIN 70/30) (70-30) 100 UNIT/ML injection Inject 0.15 mLs (15 Units total) into the skin 2 (two) times daily with a meal. If blood sugars over 200 , change to 20 units each time insulin given - Subcutaneous 10 mL 11  . simethicone (MYLICON) 80 MG chewable tablet Chew 2 tablets (160 mg total) by mouth 3 (three) times daily before meals. 30 tablet 0   No current facility-administered medications for this visit.     Physical Exam:  BP 140/68 (BP Location: Left Arm, Patient Position: Sitting, Cuff Size: Large)   Pulse 84   Resp 16   Ht 4' 10.5" (1.486 m)   Wt 158 lb 9.6 oz (71.9 kg)   SpO2 94% Comment: ON RA  BMI 32.58 kg/m   Gen: no apparent distress Heart: RRR Lungs: mildly diminished in the bases Ext: + edema 1+ Incisions: well healed  A/P:  1. S/P CABG x 3- doing much better.  They state Lasix really helped with  her symptoms.  However she has stopped taking this and does have edema on exam.  I explained that she will likely require lasix on a long term basis.  She was instructed to take 40 mg daily for the next 5 days, then she can take daily prn for a weight gain of 3.5 lbs 2. HTN- the Norvasc could be contributing to her edema.  She would probably do better on an ARB, she is due to have a repeat labs this week... If her creatinine is stabilized, she can possibly transition to Cozaar at her next Cardiology follow up 3. CKD- scheduled to follow up with Nephrology on 1/16 4. GI- abdominal pain/distention, drastically improved, continue Nexium.Marland Kitchen will follow up with GI if symptoms persist 5.DM- sugars low at night at times, okay to decrease insulin dose.  Instructed to  keep track of sugars and follow up with PCP for adjustment on insulin regimen 6. Dispo- patient doing significantly better than when seen in December.  Okay to start cardiac rehab tomorrow, will follow up with Cardiology as scheduled.  RTC prn  Ellwood Handler, PA-C [facility] [phone]

## 2018-11-02 ENCOUNTER — Encounter (HOSPITAL_COMMUNITY)
Admission: RE | Admit: 2018-11-02 | Discharge: 2018-11-02 | Disposition: A | Discharge status: Home / Self Care | Payer: Self-pay | Source: Ambulatory Visit | Attending: Cardiology | Admitting: Cardiology

## 2018-11-02 NOTE — Progress Notes (Signed)
Patient started exercise in the cardiac rehab maintenance program today and tolerated exercise fair. Patient very winded with exercise, taking multiple rest breaks on the recumbent NuStep machine and the arm ergometer. While manually checking patient's heart rate on the arm ergometer, HR was noted to be irregular. Patient was put on Zoll monitor and rhythm was normal sinus rhythm with frequent PVCs and bigeminy. This is not new for patient per 12-lead EKG and per Phase I cardiac rehab staff that worked with patient during her inpatient stay. Patient has a follow-up appointment with cardiology on Friday, will fax exercise log and EKG tracings for review.  Sol Passer, MS, ACSM CEP

## 2018-11-04 ENCOUNTER — Encounter (HOSPITAL_COMMUNITY)
Admission: RE | Admit: 2018-11-04 | Discharge: 2018-11-04 | Disposition: A | Discharge status: Home / Self Care | Payer: Self-pay | Source: Ambulatory Visit | Attending: Cardiology | Admitting: Cardiology

## 2018-11-04 ENCOUNTER — Ambulatory Visit (INDEPENDENT_AMBULATORY_CARE_PROVIDER_SITE_OTHER): Payer: Self-pay | Admitting: Cardiology

## 2018-11-04 ENCOUNTER — Encounter: Payer: Self-pay | Admitting: Cardiology

## 2018-11-04 VITALS — BP 122/68 | HR 47 | Ht <= 58 in | Wt 155.8 lb

## 2018-11-04 DIAGNOSIS — E782 Mixed hyperlipidemia: Secondary | ICD-10-CM

## 2018-11-04 DIAGNOSIS — D509 Iron deficiency anemia, unspecified: Secondary | ICD-10-CM

## 2018-11-04 DIAGNOSIS — N179 Acute kidney failure, unspecified: Secondary | ICD-10-CM

## 2018-11-04 DIAGNOSIS — N184 Chronic kidney disease, stage 4 (severe): Secondary | ICD-10-CM

## 2018-11-04 DIAGNOSIS — I10 Essential (primary) hypertension: Secondary | ICD-10-CM

## 2018-11-04 DIAGNOSIS — I2510 Atherosclerotic heart disease of native coronary artery without angina pectoris: Secondary | ICD-10-CM

## 2018-11-04 DIAGNOSIS — E871 Hypo-osmolality and hyponatremia: Secondary | ICD-10-CM

## 2018-11-04 DIAGNOSIS — Z951 Presence of aortocoronary bypass graft: Secondary | ICD-10-CM

## 2018-11-04 DIAGNOSIS — I6523 Occlusion and stenosis of bilateral carotid arteries: Secondary | ICD-10-CM

## 2018-11-04 DIAGNOSIS — I255 Ischemic cardiomyopathy: Secondary | ICD-10-CM

## 2018-11-04 DIAGNOSIS — I493 Ventricular premature depolarization: Secondary | ICD-10-CM

## 2018-11-04 NOTE — Patient Instructions (Signed)
Your physician recommends that you continue on your current medications as directed. Please refer to the Current Medication list given to you today.  Your physician recommends that you schedule a follow-up appointment in:  [place] Acie Fredrickson

## 2018-11-04 NOTE — Progress Notes (Addendum)
Cardiology Office Note   Date:  11/04/2018   ID:  Joy Boyd, Joy Boyd Sep 04, 1951, MRN [id number]  PCP:  Ronnald Nian, DO  Cardiologist:  Dr. Acie Fredrickson    Chief Complaint  Patient presents with  . Hospitalization Follow-up      History of Present Illness: Joy Boyd is a 68 y.o. female who presents for post hospitalization. Her family is translating Urdu language.    hx of hypertension, GERD, multivessel CAD with most recent stents to RCA, left circumflex and LAD April 2019 (approximately 6 stents total per son) visiting her son and daughter-in-law from [place] ([place])  for the last month with a history as stated above who presented to [facility] on 08/20/2018 with increasing dyspnea and productive cough with intermittent atypical chest pain with radiation to her left arm for the last 1 month. Chest pain is noted to be worse with lying flat and with coughing, is nonexertional and has pleuritic components   Cardiac cath with Severe distal left main disease involving the ostium of both LAD and circumflex. Mild in-stent restenosis in the LAD, circumflex and RCA stents with moderate disease beyond the stent in the LAD.  EF 20%.    CAD with CABG X 3, LIMA to LAD, SVG to OM and SVG to PDA.  08/29/18  Not able to be discharged until 09/22/18.   She has just started cardiac rehab.  She did have issues post op with AKI on chronic kidney disease and improved prior to discharge- to see Dr. Posey Pronto next week.  Labs today.  Intermittent lasix for edema.  Had hyponatremia -tolvaptan helped She had abd pain - + ileus/gastroparesis,  GI followed PPI was added.  Today resolved and appetite has improved. Anemia, iron def and chronic kidney disease.  HTN on amlodipine and lower ext edema at times.  Would like to change to ARB but will defer to Dr. Posey Pronto with renal.  She is to have labs today.  Also with coreg 25 BID, and hydralazine.   PVS bigeminy - cardiac rehab has sent strips. She had PVCs throughout  hospitalization. Initially may have attributed to ischemia but she has had since CABG and improvement in EF.  No symptoms at all. ICM at 20% prior to CABG - now improved to 50-55% with paradoxical septal motion.   DM-2 she is stable.    Today as above and she is feeling better.  Still with weakness but energy is returning.  EKG with Big. PVCs.  She plans to return to [place] in 2 months if things continue to improve.    Past Medical History:  Diagnosis Date  . Acute on chronic combined systolic and diastolic CHF (congestive heart failure) ([place]) 08/23/2018  . Coronary artery disease   . Diabetes mellitus type 2 in obese ([place]) 08/23/2018  . Diabetes mellitus without complication ([place])   . Hypertension   . Pure hypercholesterolemia 08/23/2018  . PVC (premature ventricular contraction) 08/23/2018    Past Surgical History:  Procedure Laterality Date  . CORONARY ARTERY BYPASS GRAFT N/A 08/29/2018   Procedure: CORONARY ARTERY BYPASS GRAFTING (CABG) x three, using left internal mammary artery and right leg greater saphenous vein harvested endoscopically;  Surgeon: Gaye Pollack, MD;  Location: [facility];  Service: Open Heart Surgery;  Laterality: N/A;  . LEFT HEART CATH AND CORONARY ANGIOGRAPHY N/A 08/23/2018   Procedure: LEFT HEART CATH AND CORONARY ANGIOGRAPHY;  Surgeon: Leonie Man, MD;  Location: [facility];  Service: Cardiovascular;  Laterality:  N/A;  . RIGHT HEART CATH N/A 08/25/2018   Procedure: RIGHT HEART CATH;  Surgeon: Belva Crome, MD;  Location: [facility];  Service: Cardiovascular;  Laterality: N/A;  . TEE WITHOUT CARDIOVERSION N/A 08/29/2018   Procedure: TRANSESOPHAGEAL ECHOCARDIOGRAM (TEE);  Surgeon: Gaye Pollack, MD;  Location: [facility];  Service: Open Heart Surgery;  Laterality: N/A;     Current Outpatient Medications  Medication Sig Dispense Refill  . acetaminophen (TYLENOL) 325 MG tablet Take 2 tablets (650 mg total) by mouth every 6 (six) hours as  needed for mild pain.    Marland Kitchen amLODipine (NORVASC) 10 MG tablet Take 1 tablet (10 mg total) by mouth daily. 60 tablet 1  . aspirin EC 81 MG EC tablet Take 1 tablet (81 mg total) by mouth daily. 30 tablet 1  . atorvastatin (LIPITOR) 80 MG tablet Take 1 tablet (80 mg total) by mouth daily. 60 tablet 1  . carvedilol (COREG) 25 MG tablet Take 1 tablet (25 mg total) by mouth 2 (two) times daily with a meal. 120 tablet 1  . clopidogrel (PLAVIX) 75 MG tablet Take 75 mg by mouth daily.    Marland Kitchen docusate sodium (COLACE) 100 MG capsule Take 100 mg by mouth 2 (two) times daily as needed for mild constipation.    Marland Kitchen esomeprazole (NEXIUM) 40 MG capsule Take 1 capsule (40 mg total) by mouth 2 (two) times daily. 60 capsule 1  . hydrALAZINE (APRESOLINE) 50 MG tablet Take 1 tablet (50 mg total) by mouth every 8 (eight) hours. 90 tablet 1  . insulin NPH-regular Human (NOVOLIN 70/30) (70-30) 100 UNIT/ML injection Inject 0.15 mLs (15 Units total) into the skin 2 (two) times daily with a meal. If blood sugars over 200 , change to 20 units each time insulin given - Subcutaneous 10 mL 11  . simethicone (MYLICON) 80 MG chewable tablet Chew 2 tablets (160 mg total) by mouth 3 (three) times daily before meals. 30 tablet 0   No current facility-administered medications for this visit.     Allergies:   Patient has no known allergies.    Social History:  The patient  reports that she has never smoked. She has never used smokeless tobacco. She reports that she does not drink alcohol or use drugs.   Family History:  The patient's family history includes CAD in her father; Diabetes in her brother and sister; Hypertension in her father.    ROS:  General:no colds or fevers, + weight loss since 09/22/18 Skin:no rashes or ulcers HEENT:no blurred vision, no congestion CV:see HPI PUL:see HPI GI:no diarrhea constipation or melena, no indigestion, no further abd pain GU:no hematuria, no dysuria MS:no joint pain, no  claudication Neuro:no syncope, no lightheadedness Endo:+diabetes, no thyroid disease  Wt Readings from Last 3 Encounters:  11/04/18 155 lb 12.8 oz (70.7 kg)  11/01/18 158 lb 9.6 oz (71.9 kg)  10/21/18 155 lb (70.3 kg)     PHYSICAL EXAM: VS:  BP 122/68   Pulse (!) 47   Ht 4\' 10"  (1.473 m)   Wt 155 lb 12.8 oz (70.7 kg)   SpO2 97%   BMI 32.56 kg/m  , BMI Body mass index is 32.56 kg/m. General:Pleasant affect, NAD Skin:Warm and dry, brisk capillary refill HEENT:normocephalic, sclera clear, mucus membranes moist Neck:supple, no JVD, no bruits  Heart:S1S2 RRR with freq premature beats, without murmur, gallup, rub or click, chest incision well healed. Lungs:clear without rales, rhonchi, or wheezes AST:MHDQ, non tender, + BS, do not  palpate liver spleen or masses SWN:IOEVO to 1+  lower ext edema, 2+ pedal pulses, 2+ radial pulses Neuro:alert and oriented X 3, MAE, follows commands, + facial symmetry    EKG:  EKG is ordered today. The ekg ordered today demonstrates SR with bigeminy PVCs . RAD and septal infarct.     Recent Labs: 09/10/2018: B Natriuretic Peptide 732.9 09/12/2018: Magnesium 1.9 09/13/2018: Hemoglobin 9.2; Platelets 340 09/15/2018: TSH 5.284 10/11/2018: BUN 44; Creat 1.55; Potassium 5.2; Sodium 133 10/21/2018: ALT 30    Lipid Panel    Component Value Date/Time   CHOL 116 08/24/2018 0930   TRIG 171 (H) 08/24/2018 0930   HDL 31 (L) 08/24/2018 0930   CHOLHDL 3.7 08/24/2018 0930   VLDL 34 08/24/2018 0930   LDLCALC 51 08/24/2018 0930       Other studies Reviewed: Additional studies/ records that were reviewed today include:  09/13/18 TTE Study Conclusions  - Left ventricle: The cavity size was normal. There was mild   concentric hypertrophy. Systolic function was normal. The   estimated ejection fraction was in the range of 50% to 55%.   Diffuse hypokinesis. Left ventricular diastolic function   parameters were normal. - Ventricular septum: Septal  motion showed paradox. - Mitral valve: There was mild regurgitation. - Left atrium: The atrium was mildly dilated. - Tricuspid valve: There was moderate regurgitation. - Pulmonary arteries: Systolic pressure was mildly increased. PA   peak pressure: 35 mm Hg (S). - Pericardium, extracardiac: There was a left pleural effusion.  Impressions:  - Since the last study on 08/21/2018 LVEF has improved from 20-25%   to 50-55% with paradoxical septal motion.   08/29/18 Procedure:  CABG 1. Median Sternotomy 2. Extracorporeal circulation 3.   Coronary artery bypass grafting x 3   Left internal mammary graft to the LAD  SVG to OM  SVG to PDA  4.   Endoscopic vein harvest from the right leg 5.   Insertion of left femoral arterial line    TEE 08/29/18 Result status: Final result   Septum: No Patent Foramen Ovale present.  Left atrium: Patent foramen ovale not present.  Left atrium: Cavity is dilated and dilated.  Mitral valve: Mild regurgitation.  Right ventricle: Cavity is mildly dilated. Mildly reduced systolic function. There is mild hypertrophy.      Dopplers pre CABG 08/28/18 Summary: Right Carotid: Velocities in the right ICA are consistent with a 40-59%                stenosis.  Left Carotid: Velocities in the left ICA are consistent with a 40-59% stenosis.  Right Upper Extremity: Doppler waveforms decrease 50% with right radial compression. Doppler waveforms remain within normal limits with right ulnar compression. Left Upper Extremity: Doppler waveforms remain within normal limits with left radial compression. Doppler waveforms remain within normal limits with left ulnar compression.  Right: Resting right ankle-brachial index is within normal range. No evidence of significant right lower extremity arterial disease.  Left: Resting left ankle-brachial index is within normal range. No evidence of significant left lower extremity arterial disease.    Rt  heart cath 08/25/18  Hemodynamic findings consistent with mild pulmonary hypertension. Recorded pulmonary artery pressure 42 over 17 mmHg. Mean PA pressure 31 mmHg  Mean pulmonary capillary wedge pressure 19 mmHg.  Cardiac output 4.8 L/min; cardiac index 2.82 L/min/m   Cardiac cath 08/23/18  Prox RCA lesion is 40% stenosed.  Previously placed Prox RCA to Mid RCA stent is 5% stenosed. Mid  RCA to Dist RCA stent (unknown type) is widely patent.  Dist RCA lesion is 40% stenosed. Post Atrio lesion is 65% stenosed.  Mid LM to Ost LAD lesion is 85% stenosed with 75% stenosed side branch in Ost Cx.  Previously placed Prox LAD to Mid LAD stent (unknown type) is widely patent. Mid LAD stent is 15% stenosed.  Mid LAD to Dist LAD lesion is 45% stenosed.  Previously placed Prox Cx to Mid Cx stent is 5% stenosed.  LV end diastolic pressure is moderately elevated.   SUMMARY  Severe distal left main disease involving the ostium of both LAD and circumflex.  Mild in-stent restenosis in the LAD, circumflex and RCA stents with moderate disease beyond the stent in the LAD.  Echocardiographically severe Cardiomyopathy with Ejection Fraction of roughly 20%.    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  TTE 08/21/18 Study Conclusions  - Left ventricle: The cavity size was at the upper limits of   normal. Wall thickness was increased in a pattern of mild LVH.   Systolic function was severely reduced. The estimated ejection   fraction was in the range of 15% to 20%. Diffuse hypokinesis.   Features are consistent with a pseudonormal left ventricular   filling pattern, with concomitant abnormal relaxation and   increased filling pressure (grade 2 diastolic dysfunction). - Mitral valve: There was mild regurgitation. Valve area by   pressure half-time: 1.95 cm^2. - Left atrium: The atrium was moderately dilated. - Right  ventricle: Systolic function was mildly reduced. - Right atrium: The atrium was mildly to moderately dilated. - Pulmonary arteries: PA peak pressure: 34 mm Hg (S). - Impressions: There is sevre global LV dysfunction in the setting   of very frequent PVCs. 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  Impressions:  - There is sevre global LV dysfunction in the setting of very   frequent PVCs. 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   ASSESSMENT AND PLAN:  1.  CAD severe with CABG X 3 08/29/18 - today is doing well no chest pain and has started Cardiac rehab.  Prolonged hospital stay due to ileus, AKI.   Will have her follow up with Dr. Acie Fredrickson in 6 weeks prior to her return home.  Continue ASA and BB along with statin.   2.   ICM EF 20-25% prior to CABG and improved post op to 50-55%  3.  PVCs had throughout hospitalization and today.  She is asymptomatic.  Her EF is normal,  Discussed with Dr. Caryl Comes the DOD and with normal EF and on BB would not plan any tests.  Will check K+.  If they continue at 6 months would repeat echo.  The PVCs could effect EF.  Continue cardiac rehab.    4.  HTN BP is controlled today though she continues with intermittent edema, could be from amlodipine.  Depending on labs and DR. Patel's opinion would change to ARB.  Will defer to Dr. Posey Pronto.   5.  Acute on chronic kidney disease last Cr 1.55 on 12/17 BUN 44 follow up with Dr.Patel   6.  Hyponatremia 133 - followed by renal.  7.  Anemia post op and iron def.  Last check 11/19 was 9.2.  Labs today  8.  abd pain post ileus and gastroparesis now resolved.  appetite picking up    9.  DM-2 per PCP.    10. Carotid disease, needs follow up in a year. Most likely in [place].  11. HLD on atorvastatin 80, will need recheck of lipids prior to going home.    Current medicines are reviewed with the patient today.  The patient Has no concerns regarding medicines.  Addendum from Dr. Posey Pronto Nephrology "That is a very good  thought and I was hoping to do that at her visit today however, she also had hyperkalemia that makes me a little squeamish about that conversion. I gave her some samples of Veltassa and put her on furosemide 20 mg daily with the recommendation for her nephrologist in [place] (when she sees him next month after traveling back home) to convert her to captopril (which she was previously taking) from hydralazine or amlodipine (both of which could cause vasodilatory edema). "  The following changes have been made:  See above Labs/ tests ordered today include:see above  Disposition:   FU:  see above  Signed, Cecilie Kicks, NP  11/04/2018 3:26 PM    [facility] [place], [place] [address], [place], [place] Phone: [phone]; Fax: [phone]

## 2018-11-07 ENCOUNTER — Encounter (HOSPITAL_COMMUNITY)
Admission: RE | Admit: 2018-11-07 | Discharge: 2018-11-07 | Disposition: A | Discharge status: Home / Self Care | Payer: Self-pay | Source: Ambulatory Visit | Attending: Cardiology | Admitting: Cardiology

## 2018-11-09 ENCOUNTER — Encounter (HOSPITAL_COMMUNITY)
Admission: RE | Admit: 2018-11-09 | Discharge: 2018-11-09 | Disposition: A | Discharge status: Home / Self Care | Payer: Self-pay | Source: Ambulatory Visit | Attending: Cardiology | Admitting: Cardiology

## 2018-11-11 ENCOUNTER — Inpatient Hospital Stay (INDEPENDENT_AMBULATORY_CARE_PROVIDER_SITE_OTHER): Payer: Self-pay | Admitting: Physician Assistant

## 2018-11-11 ENCOUNTER — Encounter (HOSPITAL_COMMUNITY)
Admission: RE | Admit: 2018-11-11 | Discharge: 2018-11-11 | Disposition: A | Discharge status: Home / Self Care | Payer: Self-pay | Source: Ambulatory Visit | Attending: Cardiology | Admitting: Cardiology

## 2018-11-14 ENCOUNTER — Encounter (HOSPITAL_COMMUNITY)
Admission: RE | Admit: 2018-11-14 | Discharge: 2018-11-14 | Disposition: A | Discharge status: Home / Self Care | Payer: Self-pay | Source: Ambulatory Visit | Attending: Cardiology | Admitting: Cardiology

## 2018-11-16 ENCOUNTER — Encounter (HOSPITAL_COMMUNITY)
Admission: RE | Admit: 2018-11-16 | Discharge: 2018-11-16 | Disposition: A | Discharge status: Home / Self Care | Payer: Self-pay | Source: Ambulatory Visit | Attending: Cardiology | Admitting: Cardiology

## 2018-11-18 ENCOUNTER — Encounter (HOSPITAL_COMMUNITY): Payer: Self-pay

## 2018-11-21 ENCOUNTER — Encounter (HOSPITAL_COMMUNITY)
Admission: RE | Admit: 2018-11-21 | Discharge: 2018-11-21 | Disposition: A | Discharge status: Home / Self Care | Payer: Self-pay | Source: Ambulatory Visit | Attending: Cardiology | Admitting: Cardiology

## 2018-11-21 MED FILL — ATORVASTATIN CALCIUM 80 MG: 80 | 60 days supply | Qty: 60 | Fill #0

## 2018-11-21 MED FILL — hydrALAZINE HCL 25 MG TABS: 25 | 60 days supply | Qty: 180 | Fill #0

## 2018-11-21 MED FILL — CARVEDILOL 25 MG TABLET: 25 | 60 days supply | Qty: 120 | Fill #0

## 2018-11-21 MED FILL — AMLODIPINE BESYLATE 10 MG T: 10 | 60 days supply | Qty: 60 | Fill #0

## 2018-11-23 ENCOUNTER — Encounter (HOSPITAL_COMMUNITY)
Admission: RE | Admit: 2018-11-23 | Discharge: 2018-11-23 | Disposition: A | Discharge status: Home / Self Care | Payer: Self-pay | Source: Ambulatory Visit | Attending: Cardiology | Admitting: Cardiology

## 2018-11-25 ENCOUNTER — Encounter (HOSPITAL_COMMUNITY)
Admission: RE | Admit: 2018-11-25 | Discharge: 2018-11-25 | Disposition: A | Discharge status: Home / Self Care | Payer: Self-pay | Source: Ambulatory Visit | Attending: Cardiology | Admitting: Cardiology

## 2018-11-28 ENCOUNTER — Encounter (HOSPITAL_COMMUNITY)
Admission: RE | Admit: 2018-11-28 | Discharge: 2018-11-28 | Disposition: A | Discharge status: Home / Self Care | Payer: Self-pay | Source: Ambulatory Visit | Attending: Cardiology | Admitting: Cardiology

## 2018-11-28 DIAGNOSIS — E1122 Type 2 diabetes mellitus with diabetic chronic kidney disease: Secondary | ICD-10-CM | POA: Insufficient documentation

## 2018-11-28 DIAGNOSIS — I2510 Atherosclerotic heart disease of native coronary artery without angina pectoris: Secondary | ICD-10-CM | POA: Insufficient documentation

## 2018-11-28 DIAGNOSIS — I255 Ischemic cardiomyopathy: Secondary | ICD-10-CM | POA: Insufficient documentation

## 2018-11-28 DIAGNOSIS — I130 Hypertensive heart and chronic kidney disease with heart failure and stage 1 through stage 4 chronic kidney disease, or unspecified chronic kidney disease: Secondary | ICD-10-CM | POA: Insufficient documentation

## 2018-11-28 DIAGNOSIS — E782 Mixed hyperlipidemia: Secondary | ICD-10-CM | POA: Insufficient documentation

## 2018-11-28 DIAGNOSIS — Z7902 Long term (current) use of antithrombotics/antiplatelets: Secondary | ICD-10-CM | POA: Insufficient documentation

## 2018-11-28 DIAGNOSIS — I6523 Occlusion and stenosis of bilateral carotid arteries: Secondary | ICD-10-CM | POA: Insufficient documentation

## 2018-11-28 DIAGNOSIS — N179 Acute kidney failure, unspecified: Secondary | ICD-10-CM | POA: Insufficient documentation

## 2018-11-28 DIAGNOSIS — E669 Obesity, unspecified: Secondary | ICD-10-CM | POA: Insufficient documentation

## 2018-11-28 DIAGNOSIS — N184 Chronic kidney disease, stage 4 (severe): Secondary | ICD-10-CM | POA: Insufficient documentation

## 2018-11-28 DIAGNOSIS — Z79899 Other long term (current) drug therapy: Secondary | ICD-10-CM | POA: Insufficient documentation

## 2018-11-28 DIAGNOSIS — Z6832 Body mass index (BMI) 32.0-32.9, adult: Secondary | ICD-10-CM | POA: Insufficient documentation

## 2018-11-28 DIAGNOSIS — I5043 Acute on chronic combined systolic (congestive) and diastolic (congestive) heart failure: Secondary | ICD-10-CM | POA: Insufficient documentation

## 2018-11-28 DIAGNOSIS — I340 Nonrheumatic mitral (valve) insufficiency: Secondary | ICD-10-CM | POA: Insufficient documentation

## 2018-11-28 DIAGNOSIS — E871 Hypo-osmolality and hyponatremia: Secondary | ICD-10-CM | POA: Insufficient documentation

## 2018-11-28 DIAGNOSIS — Z794 Long term (current) use of insulin: Secondary | ICD-10-CM | POA: Insufficient documentation

## 2018-11-28 DIAGNOSIS — I493 Ventricular premature depolarization: Secondary | ICD-10-CM | POA: Insufficient documentation

## 2018-11-28 DIAGNOSIS — E1169 Type 2 diabetes mellitus with other specified complication: Secondary | ICD-10-CM | POA: Insufficient documentation

## 2018-11-28 DIAGNOSIS — K219 Gastro-esophageal reflux disease without esophagitis: Secondary | ICD-10-CM | POA: Insufficient documentation

## 2018-11-28 DIAGNOSIS — D509 Iron deficiency anemia, unspecified: Secondary | ICD-10-CM | POA: Insufficient documentation

## 2018-11-28 DIAGNOSIS — Z7982 Long term (current) use of aspirin: Secondary | ICD-10-CM | POA: Insufficient documentation

## 2018-11-28 DIAGNOSIS — Z951 Presence of aortocoronary bypass graft: Secondary | ICD-10-CM | POA: Insufficient documentation

## 2018-11-30 ENCOUNTER — Encounter (HOSPITAL_COMMUNITY)
Admission: RE | Admit: 2018-11-30 | Discharge: 2018-11-30 | Disposition: A | Discharge status: Home / Self Care | Payer: Self-pay | Source: Ambulatory Visit | Attending: Cardiology | Admitting: Cardiology

## 2018-11-30 NOTE — Progress Notes (Signed)
Ryn reported to Outpt. Cardiac Rehab Maintenance program today for exercise. BP 110/50 HR 74. She started exercise on the Nustep. Suddenly he husband reported that she was not feeling well. It is difficult to communicate with patient due to her not being able to speak [place]. He husband does interrupt for her but he does not always understand.Her husband came to get and stated that she wasn't feeling well. He stated that she was feeling well in her chest. I started asking him questions and suddenly I noticed that patient was going to vomit. I got her in the treatment room and she threw up a large amount of yellow liquid. BP 142/56 temperature was 97.7. Pt stated that she wanted to rest and she would be ready to exercise. She states that after throwing up she felt better. I instructed her that she needed to go home now, stay home until she was symptom free for 48 hours. I instructed them that if she runs a fever or continues to vomit that she needs to go to her physician.  Pt left the department with her husband in stable condition.

## 2018-12-02 ENCOUNTER — Encounter (HOSPITAL_COMMUNITY): Payer: Self-pay

## 2018-12-05 ENCOUNTER — Encounter (HOSPITAL_COMMUNITY)
Admission: RE | Admit: 2018-12-05 | Discharge: 2018-12-05 | Disposition: A | Discharge status: Home / Self Care | Payer: Medicaid Other | Source: Ambulatory Visit | Attending: Cardiology | Admitting: Cardiology

## 2018-12-07 ENCOUNTER — Encounter (HOSPITAL_COMMUNITY)
Admission: RE | Admit: 2018-12-07 | Discharge: 2018-12-07 | Disposition: A | Discharge status: Home / Self Care | Payer: Self-pay | Source: Ambulatory Visit | Attending: Cardiology | Admitting: Cardiology

## 2018-12-09 ENCOUNTER — Encounter (HOSPITAL_COMMUNITY)
Admission: RE | Admit: 2018-12-09 | Discharge: 2018-12-09 | Disposition: A | Discharge status: Home / Self Care | Payer: Medicaid Other | Source: Ambulatory Visit | Attending: Cardiology | Admitting: Cardiology

## 2018-12-12 ENCOUNTER — Other Ambulatory Visit (HOSPITAL_COMMUNITY): Payer: Self-pay | Admitting: Physician Assistant

## 2018-12-12 ENCOUNTER — Telehealth: Payer: Self-pay

## 2018-12-12 ENCOUNTER — Encounter (HOSPITAL_COMMUNITY): Payer: Self-pay

## 2018-12-12 NOTE — Telephone Encounter (Signed)
Patient's daughter contacted the office requesting refills of her medications as she is going out of the country.  I advised her to contact her Cardiologist, Dr. Cathie Olden.  She acknowledged receipt.

## 2018-12-13 ENCOUNTER — Other Ambulatory Visit: Payer: Self-pay | Admitting: *Deleted

## 2018-12-13 MED ORDER — HYDRALAZINE HCL 50 MG PO TABS: 50.0000 mg | ORAL_TABLET | Freq: Three times a day (TID) | ORAL | 1 refills | Status: DC

## 2018-12-13 MED ORDER — CARVEDILOL 25 MG PO TABS: 25.0000 mg | ORAL_TABLET | Freq: Two times a day (BID) | ORAL | 1 refills | Status: DC

## 2018-12-13 MED ORDER — ATORVASTATIN CALCIUM 80 MG PO TABS: 80.0000 mg | ORAL_TABLET | Freq: Every day | ORAL | 1 refills | Status: DC

## 2018-12-14 ENCOUNTER — Telehealth: Payer: Self-pay

## 2018-12-14 ENCOUNTER — Emergency Department (HOSPITAL_COMMUNITY)
Admission: EM | Admit: 2018-12-14 | Discharge: 2018-12-14 | Disposition: A | Discharge status: Home / Self Care | Payer: Self-pay | Attending: Emergency Medicine | Admitting: Emergency Medicine

## 2018-12-14 ENCOUNTER — Encounter (HOSPITAL_COMMUNITY): Payer: Self-pay

## 2018-12-14 ENCOUNTER — Telehealth: Payer: Self-pay | Admitting: Cardiovascular Disease

## 2018-12-14 ENCOUNTER — Encounter (HOSPITAL_COMMUNITY): Payer: Self-pay | Admitting: Emergency Medicine

## 2018-12-14 ENCOUNTER — Other Ambulatory Visit: Payer: Self-pay

## 2018-12-14 ENCOUNTER — Emergency Department (HOSPITAL_COMMUNITY): Payer: Self-pay

## 2018-12-14 DIAGNOSIS — Z794 Long term (current) use of insulin: Secondary | ICD-10-CM | POA: Insufficient documentation

## 2018-12-14 DIAGNOSIS — Z7902 Long term (current) use of antithrombotics/antiplatelets: Secondary | ICD-10-CM | POA: Insufficient documentation

## 2018-12-14 DIAGNOSIS — E119 Type 2 diabetes mellitus without complications: Secondary | ICD-10-CM | POA: Insufficient documentation

## 2018-12-14 DIAGNOSIS — Z951 Presence of aortocoronary bypass graft: Secondary | ICD-10-CM | POA: Insufficient documentation

## 2018-12-14 DIAGNOSIS — I5040 Unspecified combined systolic (congestive) and diastolic (congestive) heart failure: Secondary | ICD-10-CM | POA: Insufficient documentation

## 2018-12-14 DIAGNOSIS — I110 Hypertensive heart disease with heart failure: Secondary | ICD-10-CM | POA: Insufficient documentation

## 2018-12-14 DIAGNOSIS — Z79899 Other long term (current) drug therapy: Secondary | ICD-10-CM | POA: Insufficient documentation

## 2018-12-14 DIAGNOSIS — I2510 Atherosclerotic heart disease of native coronary artery without angina pectoris: Secondary | ICD-10-CM | POA: Insufficient documentation

## 2018-12-14 DIAGNOSIS — Z7982 Long term (current) use of aspirin: Secondary | ICD-10-CM | POA: Insufficient documentation

## 2018-12-14 DIAGNOSIS — R0789 Other chest pain: Secondary | ICD-10-CM | POA: Insufficient documentation

## 2018-12-14 LAB — CBC
HCT: 27.5 % — ABNORMAL LOW (ref 36.0–46.0)
Hemoglobin: 8.3 g/dL — ABNORMAL LOW (ref 12.0–15.0)
MCH: 26.9 pg (ref 26.0–34.0)
MCHC: 30.2 g/dL (ref 30.0–36.0)
MCV: 89.3 fL (ref 80.0–100.0)
Platelets: 179 10*3/uL (ref 150–400)
RBC: 3.08 MIL/uL — ABNORMAL LOW (ref 3.87–5.11)
RDW: 16.6 % — ABNORMAL HIGH (ref 11.5–15.5)
WBC: 7.4 10*3/uL (ref 4.0–10.5)
nRBC: 0 % (ref 0.0–0.2)

## 2018-12-14 LAB — BASIC METABOLIC PANEL
Anion gap: 7 (ref 5–15)
BUN: 36 mg/dL — ABNORMAL HIGH (ref 8–23)
CO2: 18 mmol/L — ABNORMAL LOW (ref 22–32)
Calcium: 9.3 mg/dL (ref 8.9–10.3)
Chloride: 111 mmol/L (ref 98–111)
Creatinine, Ser: 1.55 mg/dL — ABNORMAL HIGH (ref 0.44–1.00)
GFR calc Af Amer: 40 mL/min — ABNORMAL LOW (ref >60)
GFR calc non Af Amer: 35 mL/min — ABNORMAL LOW (ref >60)
Glucose, Bld: 144 mg/dL — ABNORMAL HIGH (ref 70–99)
Potassium: 5.2 mmol/L — ABNORMAL HIGH (ref 3.5–5.1)
SODIUM: 136 mmol/L (ref 135–145)

## 2018-12-14 LAB — I-STAT TROPONIN, ED: Troponin i, poc: 0 ng/mL (ref 0.00–0.08)

## 2018-12-14 MED ORDER — SODIUM CHLORIDE 0.9% FLUSH: 3.0000 mL | Freq: Once | INTRAVENOUS | Status: DC

## 2018-12-14 NOTE — ED Notes (Signed)
Pt verbalized understanding of discharge instructions and denies any further questions at this time.   

## 2018-12-14 NOTE — Telephone Encounter (Signed)
Patient's daughter contacted the office requesting advice about her mother's pain.  She stated that she thinks she slept on it wrong but it has been a couple days since the incidence.  I asked if she heard and popping or cracking in the chest with movement, she stated no.  I advised that if she is having chest pain she needed to contact her Cardiologist to be seen or go to the nearest emergency room to be evaluated.  She acknowledged receipt.

## 2018-12-14 NOTE — Discharge Instructions (Signed)
Follow up with your cardiologist and family doc. Return for worsening pain, exertional symptoms.

## 2018-12-14 NOTE — ED Provider Notes (Signed)
[facility] EMERGENCY DEPARTMENT Provider Note   CSN: [id number] Arrival date & time: 12/14/18  1314    History   Chief Complaint Chief Complaint  Patient presents with  . Chest Pain  . Weakness    HPI Joy Boyd is a 68 y.o. female.     68 yo F with a chief complaint of chest pain.  This is described as sharp worse when she moves or palpates the area.  Right at the area of her sternotomy.  She has felt generally fatigued as well.  She denies cough congestion or fever.  Had an upper respiratory illness a couple weeks ago.  Had called her cardiologist and her cardiothoracic surgeon who suggested she come to the ED for evaluation.  The history is provided by the patient and a relative. The history is limited by a language barrier. A language interpreter was used (daughter).  Chest Pain  Pain location:  Substernal area Pain quality: sharp   Pain radiates to:  Upper back Pain severity:  Moderate Onset quality:  Sudden Duration:  2 days Timing:  Constant Progression:  Worsening Chronicity:  New Relieved by:  Nothing Worsened by:  Certain positions Ineffective treatments:  None tried Associated symptoms: no dizziness, no fever, no headache, no nausea, no palpitations, no shortness of breath and no vomiting   Risk factors: coronary artery disease and surgery     Past Medical History:  Diagnosis Date  . Acute on chronic combined systolic and diastolic CHF (congestive heart failure) ([place]) 08/23/2018  . Coronary artery disease   . Diabetes mellitus type 2 in obese ([place]) 08/23/2018  . Diabetes mellitus without complication ([place])   . Hypertension   . Pure hypercholesterolemia 08/23/2018  . PVC (premature ventricular contraction) 08/23/2018    Patient Active Problem List   Diagnosis Date Noted  . Constipation by delayed colonic transit 09/10/2018  . CAD (coronary artery disease) 08/29/2018  . Acute on chronic combined systolic and diastolic CHF  (congestive heart failure) ([place]) 08/23/2018  . PVC (premature ventricular contraction) 08/23/2018  . Diabetes mellitus type 2 in obese ([place]) 08/23/2018  . Pure hypercholesterolemia 08/23/2018  . CAD S/P percutaneous coronary angioplasty   . Dilated cardiomyopathy ([place])   . Coronary artery disease involving native coronary artery of native heart with angina pectoris ([place])   . Unstable angina ([place])   . Abnormal nuclear stress test   . Atypical chest pain 08/21/2018    Past Surgical History:  Procedure Laterality Date  . CORONARY ARTERY BYPASS GRAFT N/A 08/29/2018   Procedure: CORONARY ARTERY BYPASS GRAFTING (CABG) x three, using left internal mammary artery and right leg greater saphenous vein harvested endoscopically;  Surgeon: Gaye Pollack, MD;  Location: [facility];  Service: Open Heart Surgery;  Laterality: N/A;  . LEFT HEART CATH AND CORONARY ANGIOGRAPHY N/A 08/23/2018   Procedure: LEFT HEART CATH AND CORONARY ANGIOGRAPHY;  Surgeon: Leonie Man, MD;  Location: [facility];  Service: Cardiovascular;  Laterality: N/A;  . RIGHT HEART CATH N/A 08/25/2018   Procedure: RIGHT HEART CATH;  Surgeon: Belva Crome, MD;  Location: [facility];  Service: Cardiovascular;  Laterality: N/A;  . TEE WITHOUT CARDIOVERSION N/A 08/29/2018   Procedure: TRANSESOPHAGEAL ECHOCARDIOGRAM (TEE);  Surgeon: Gaye Pollack, MD;  Location: [facility];  Service: Open Heart Surgery;  Laterality: N/A;     OB History   No obstetric history on file.      Home Medications    Prior  to Admission medications   Medication Sig Start Date End Date Taking? Authorizing Provider  acetaminophen (TYLENOL) 325 MG tablet Take 2 tablets (650 mg total) by mouth every 6 (six) hours as needed for mild pain. 09/09/18   Elgie Collard, PA-C  amLODipine (NORVASC) 10 MG tablet Take 1 tablet (10 mg total) by mouth daily. 09/10/18   Elgie Collard, PA-C  aspirin EC 81 MG EC tablet Take 1 tablet (81 mg total) by mouth daily.  09/10/18   Elgie Collard, PA-C  atorvastatin (LIPITOR) 80 MG tablet Take 1 tablet (80 mg total) by mouth daily. 12/13/18   Nahser, Wonda Cheng, MD  carvedilol (COREG) 25 MG tablet Take 1 tablet (25 mg total) by mouth 2 (two) times daily with a meal. 12/13/18   Nahser, Wonda Cheng, MD  clopidogrel (PLAVIX) 75 MG tablet Take 75 mg by mouth daily.    [provider]  docusate sodium (COLACE) 100 MG capsule Take 100 mg by mouth 2 (two) times daily as needed for mild constipation.    [provider]  esomeprazole (NEXIUM) 40 MG capsule Take 1 capsule (40 mg total) by mouth 2 (two) times daily. 10/14/18   Cirigliano, Garvin Fila, DO  hydrALAZINE (APRESOLINE) 50 MG tablet Take 1 tablet (50 mg total) by mouth every 8 (eight) hours. 12/13/18   Nahser, Wonda Cheng, MD  insulin NPH-regular Human (NOVOLIN 70/30) (70-30) 100 UNIT/ML injection Inject 0.15 mLs (15 Units total) into the skin 2 (two) times daily with a meal. If blood sugars over 200 , change to 20 units each time insulin given - Subcutaneous 10/11/18   Gold, Wayne E, PA-C  simethicone (MYLICON) 80 MG chewable tablet Chew 2 tablets (160 mg total) by mouth 3 (three) times daily before meals. 09/22/18   John Giovanni, PA-C    Family History Family History  Problem Relation Age of Onset  . CAD Father   . Hypertension Father   . Diabetes Sister   . Diabetes Brother     Social History Social History   Tobacco Use  . Smoking status: Never Smoker  . Smokeless tobacco: Never Used  Substance Use Topics  . Alcohol use: Never    Frequency: Never  . Drug use: Never     Allergies   Patient has no known allergies.   Review of Systems Review of Systems  Constitutional: Negative for chills and fever.  HENT: Negative for congestion and rhinorrhea.   Eyes: Negative for redness and visual disturbance.  Respiratory: Negative for shortness of breath and wheezing.   Cardiovascular: Positive for chest pain. Negative for palpitations.    Gastrointestinal: Negative for nausea and vomiting.  Genitourinary: Negative for dysuria and urgency.  Musculoskeletal: Negative for arthralgias and myalgias.  Skin: Negative for pallor and wound.  Neurological: Negative for dizziness and headaches.     Physical Exam Updated Vital Signs BP 138/72   Pulse 70   Temp 97.6 F (36.4 C) (Oral)   Resp (!) 21   Ht 4\' 10"  (1.473 m)   Wt 72.6 kg   SpO2 98%   BMI 33.44 kg/m   Physical Exam Vitals signs and nursing note reviewed.  Constitutional:      General: She is not in acute distress.    Appearance: She is well-developed. She is not diaphoretic.  HENT:     Head: Normocephalic and atraumatic.  Eyes:     Pupils: Pupils are equal, round, and reactive to light.  Neck:  Musculoskeletal: Normal range of motion and neck supple.  Cardiovascular:     Rate and Rhythm: Normal rate and regular rhythm.     Heart sounds: No murmur. No friction rub. No gallop.   Pulmonary:     Effort: Pulmonary effort is normal.     Breath sounds: No wheezing or rales.  Chest:     Chest wall: Tenderness ( ttp about the sternum, reproduces pain) and edema present.     Comments: Bilateral lower extremity edema right greater than left which is chronic per the patient.  Pitting. Abdominal:     General: There is no distension.     Palpations: Abdomen is soft.     Tenderness: There is no abdominal tenderness.  Musculoskeletal:        General: No tenderness.  Skin:    General: Skin is warm and dry.  Neurological:     Mental Status: She is alert and oriented to person, place, and time.  Psychiatric:        Behavior: Behavior normal.      ED Treatments / Results  Labs (all labs ordered are listed, but only abnormal results are displayed) Labs Reviewed  BASIC METABOLIC PANEL - Abnormal; Notable for the following components:      Result Value   Potassium 5.2 (*)    CO2 18 (*)    Glucose, Bld 144 (*)    BUN 36 (*)    Creatinine, Ser 1.55 (*)     GFR calc non Af Amer 35 (*)    GFR calc Af Amer 40 (*)    All other components within normal limits  CBC - Abnormal; Notable for the following components:   RBC 3.08 (*)    Hemoglobin 8.3 (*)    HCT 27.5 (*)    RDW 16.6 (*)    All other components within normal limits  I-STAT TROPONIN, ED    EKG EKG Interpretation  Date/Time:  Wednesday December 14 2018 13:24:32 EST Ventricular Rate:  74 PR Interval:  174 QRS Duration: 118 QT Interval:  422 QTC Calculation: 468 R Axis:   26 Text Interpretation:  Sinus rhythm with frequent Premature ventricular complexes Incomplete left bundle branch block Nonspecific ST and T wave abnormality Abnormal ECG No significant change since last tracing Confirmed by Deno Etienne [phone]) on 12/14/2018 1:42:44 PM   Radiology Dg Chest 2 View  Result Date: 12/14/2018 CLINICAL DATA:  68 year old female with chest pain EXAM: CHEST - 2 VIEW COMPARISON:  10/10/2018 FINDINGS: Cardiomediastinal silhouette unchanged in size and contour. Median sternotomy and CABG. Blunting of the right costophrenic angle with meniscus on the lateral view, similar to the comparison. Improving aeration at the left lung base. No pneumothorax. No displaced fracture. Coronary calcifications/stent. IMPRESSION: Trace right pleural effusion. Surgical changes of median sternotomy and CABG. Electronically Signed   By: Corrie Mckusick D.O.   On: 12/14/2018 14:38    Procedures Procedures (including critical care time)  Medications Ordered in ED Medications  sodium chloride flush (NS) 0.9 % injection 3 mL (has no administration in time range)     Initial Impression / Assessment and Plan / ED Course  I have reviewed the triage vital signs and the nursing notes.  Pertinent labs & imaging results that were available during my care of the patient were reviewed by me and considered in my medical decision making (see chart for details).        68 yo F with a chief complaint chest pain.  This  reproduced on exam.  EKG unchanged troponin negative.  Most likely this is musculoskeletal in nature.  We will have the patient continue take Tylenol and tramadol.  Have her follow-up with her cardiologist and cardiothoracic surgeon.  Pain has been going on for greater than 6 hours without significant change.  Do not feel that a delta is warranted.  3:28 PM:  I have discussed the diagnosis/risks/treatment options with the patient and family and believe the pt to be eligible for discharge home to follow-up with PCP. We also discussed returning to the ED immediately if new or worsening sx occur. We discussed the sx which are most concerning (e.g., sudden worsening pain, fever, inability to tolerate by mouth) that necessitate immediate return. Medications administered to the patient during their visit and any new prescriptions provided to the patient are listed below.  Medications given during this visit Medications  sodium chloride flush (NS) 0.9 % injection 3 mL (has no administration in time range)     The patient appears reasonably screen and/or stabilized for discharge and I doubt any other medical condition or other [facility] requiring further screening, evaluation, or treatment in the ED at this time prior to discharge.    Final Clinical Impressions(s) / ED Diagnoses   Final diagnoses:  Atypical chest pain    ED Discharge Orders    None       Deno Etienne, DO 12/14/18 1528

## 2018-12-14 NOTE — Telephone Encounter (Signed)
Lpmtcb 2/19

## 2018-12-14 NOTE — ED Triage Notes (Signed)
Through a family member interpreting, pt is here for chest pain and weakness. Pt reports this is the 3rd day this has been going on. Pt reports having open heart surgery 3 months ago. Pt reports relief when taking her pain medications. Pt believes she slept wrong and does not feel this is associated with her heart. Pt called her cardiothorasic surgeon who told her to come in for evaluation.

## 2018-12-14 NOTE — Telephone Encounter (Signed)
Spoke to patient's daughter Joy Boyd) in regards to patient's CP.  She has developed a pain in the left side of her chest over the past 2 days.  It seems to be a dull aching pain.  She is not sure if she has slept wrong, but I have advised her to the ED for further evaluation.  She is planning a trip to [place] this weekend so I informed them that this would be the best way to evaluate her safety.  They verbalized understanding and thanked me for the call.

## 2018-12-14 NOTE — Telephone Encounter (Signed)
New Message:    Patient daughter calling her mother is have some chest pain and would like to come in a see some one today.

## 2018-12-15 ENCOUNTER — Other Ambulatory Visit: Payer: Self-pay | Admitting: Physician Assistant

## 2018-12-16 ENCOUNTER — Encounter (HOSPITAL_COMMUNITY): Admission: RE | Admit: 2018-12-16 | Payer: Self-pay | Source: Ambulatory Visit

## 2018-12-19 ENCOUNTER — Encounter (HOSPITAL_COMMUNITY): Payer: Self-pay

## 2018-12-20 ENCOUNTER — Ambulatory Visit: Payer: Self-pay | Admitting: Cardiovascular Disease

## 2018-12-21 ENCOUNTER — Encounter (HOSPITAL_COMMUNITY): Payer: Self-pay

## 2018-12-23 ENCOUNTER — Encounter (HOSPITAL_COMMUNITY): Payer: Self-pay

## 2018-12-26 ENCOUNTER — Encounter (HOSPITAL_COMMUNITY): Payer: Self-pay

## 2018-12-28 ENCOUNTER — Encounter (HOSPITAL_COMMUNITY): Payer: Self-pay

## 2018-12-29 MED ORDER — AMLODIPINE BESYLATE 10 MG PO TABS: 10.0000 mg | ORAL_TABLET | Freq: Every day | ORAL | 9 refills | Status: AC

## 2018-12-29 MED ORDER — CARVEDILOL 25 MG PO TABS: 25.0000 mg | ORAL_TABLET | Freq: Two times a day (BID) | ORAL | 9 refills | Status: AC

## 2018-12-29 MED ORDER — HYDRALAZINE HCL 50 MG PO TABS: 50.0000 mg | ORAL_TABLET | Freq: Three times a day (TID) | ORAL | 9 refills | Status: AC

## 2018-12-29 MED ORDER — ATORVASTATIN CALCIUM 80 MG PO TABS: 80.0000 mg | ORAL_TABLET | Freq: Every day | ORAL | 9 refills | Status: AC

## 2018-12-29 NOTE — Telephone Encounter (Signed)
Pt's medication was resent to a different pharmacy as requested. Confirmation received.  

## 2018-12-29 NOTE — Addendum Note (Signed)
Addended by: Derl Barrow on: 12/29/2018 01:34 PM   Modules accepted: Orders

## 2018-12-30 ENCOUNTER — Encounter (HOSPITAL_COMMUNITY): Payer: Self-pay

## 2019-01-02 ENCOUNTER — Encounter (HOSPITAL_COMMUNITY): Payer: Self-pay

## 2019-01-04 ENCOUNTER — Encounter (HOSPITAL_COMMUNITY): Payer: Self-pay

## 2019-01-06 ENCOUNTER — Encounter (HOSPITAL_COMMUNITY): Payer: Self-pay

## 2019-01-09 ENCOUNTER — Encounter (HOSPITAL_COMMUNITY): Payer: Self-pay

## 2019-01-11 ENCOUNTER — Encounter (HOSPITAL_COMMUNITY): Payer: Self-pay

## 2019-01-13 ENCOUNTER — Encounter (HOSPITAL_COMMUNITY): Payer: Self-pay

## 2019-01-16 ENCOUNTER — Encounter (HOSPITAL_COMMUNITY): Payer: Self-pay

## 2019-01-18 ENCOUNTER — Encounter (HOSPITAL_COMMUNITY): Payer: Self-pay

## 2019-01-20 ENCOUNTER — Encounter (HOSPITAL_COMMUNITY): Payer: Self-pay

## 2019-01-23 ENCOUNTER — Encounter (HOSPITAL_COMMUNITY): Payer: Self-pay

## 2020-06-27 IMAGING — DX DG ABDOMEN 1V
1 series · 1 of 1 positions shown · non-contrast
Comparison: 09/18/2018

CLINICAL DATA: Abdominal pain, distention

EXAM:
ABDOMEN - 1 VIEW

[t abdomen supine]
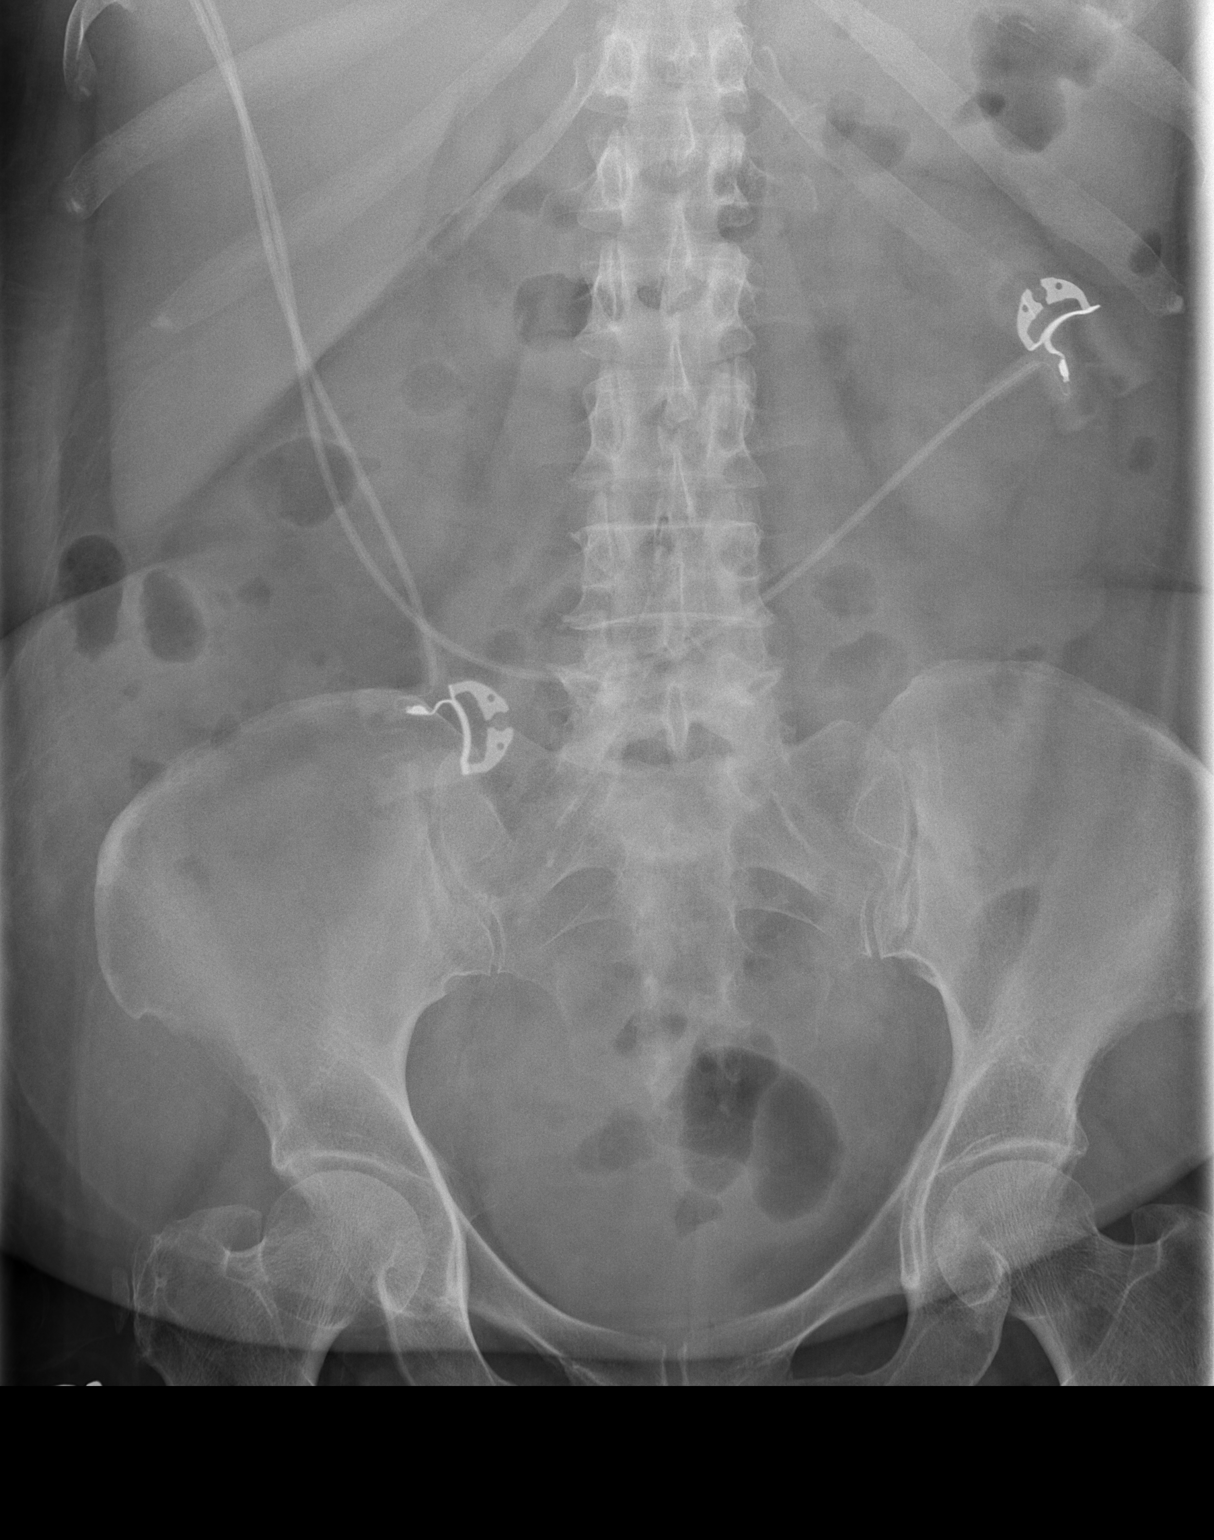

[1 of 1 positions shown; findings below may reference images not displayed]

FINDINGS: The bowel gas pattern is normal. No radio-opaque calculi or other
significant radiographic abnormality are seen.
IMPRESSION: Negative.
